# Patient Record
Sex: Male | Born: 1955
Health system: Southern US, Community
[De-identification: ages and names within clinical notes are randomized; demographics above are authoritative.]

## PROBLEM LIST (undated history)

## (undated) DIAGNOSIS — J449 Chronic obstructive pulmonary disease, unspecified: Secondary | ICD-10-CM

## (undated) DIAGNOSIS — I10 Essential (primary) hypertension: Secondary | ICD-10-CM

## (undated) DIAGNOSIS — G8929 Other chronic pain: Secondary | ICD-10-CM

## (undated) DIAGNOSIS — K219 Gastro-esophageal reflux disease without esophagitis: Secondary | ICD-10-CM

## (undated) DIAGNOSIS — M542 Cervicalgia: Secondary | ICD-10-CM

## (undated) DIAGNOSIS — K5900 Constipation, unspecified: Secondary | ICD-10-CM

## (undated) DIAGNOSIS — I739 Peripheral vascular disease, unspecified: Secondary | ICD-10-CM

## (undated) HISTORY — DX: Peripheral vascular disease, unspecified: I73.9

## (undated) HISTORY — PX: ILIAC ARTERY STENT: SHX1786

## (undated) HISTORY — DX: Chronic obstructive pulmonary disease, unspecified: J44.9

## (undated) HISTORY — PX: ANKLE SURGERY: SHX546

---

## 2007-12-15 LAB — METABOLIC PANEL, BASIC
Anion gap: 6 mmol/L (ref 5–15)
BUN/Creatinine ratio: 18 (ref 12–20)
BUN: 21 MG/DL — ABNORMAL HIGH (ref 6–20)
CO2: 31 MMOL/L (ref 21–32)
Calcium: 9.1 MG/DL (ref 8.5–10.1)
Chloride: 100 MMOL/L (ref 97–108)
Creatinine: 1.2 MG/DL (ref 0.6–1.3)
GFR est AA: >60 mL/min/{1.73_m2} (ref >60)
GFR est non-AA: >60 mL/min/{1.73_m2} (ref >60)
Glucose: 130 MG/DL — ABNORMAL HIGH (ref 50–100)
Potassium: 4.3 MMOL/L (ref 3.5–5.1)
Sodium: 137 MMOL/L (ref 136–145)

## 2007-12-15 LAB — HEPATIC FUNCTION PANEL
A-G Ratio: 1.2 (ref 1.1–2.2)
ALT (SGPT): 30 U/L (ref 30–65)
AST (SGOT): 10 U/L — ABNORMAL LOW (ref 15–37)
Albumin: 3.9 g/dL (ref 3.5–5.0)
Alk. phosphatase: 71 U/L (ref 50–136)
Bilirubin, direct: 0.1 MG/DL (ref 0.0–0.3)
Bilirubin, total: 0.4 MG/DL (ref <1.0)
Globulin: 3.3 g/dL (ref 2.0–4.0)
Protein, total: 7.2 g/dL (ref 6.4–8.2)

## 2007-12-15 LAB — LIPID PANEL
CHOL/HDL Ratio: 5.4 — ABNORMAL HIGH (ref 0–5.0)
Cholesterol, total: 220 MG/DL — ABNORMAL HIGH (ref <200)
HDL Cholesterol: 41 MG/DL (ref 40–60)
LDL, calculated: 123 MG/DL — ABNORMAL HIGH (ref 0–100)
Triglyceride: 280 MG/DL — ABNORMAL HIGH (ref 30–200)
VLDL, calculated: 56 MG/DL

## 2007-12-15 LAB — CBC W/O DIFF
HCT: 47.1 % (ref 36.6–50.3)
HGB: 15.4 g/dL (ref 12.1–17.0)
MCH: 31.5 PG (ref 26.0–34.0)
MCHC: 32.7 g/dL (ref 30.0–35.0)
MCV: 96.3 FL (ref 80.0–99.0)
PLATELET: 327 10*3/uL (ref 150–400)
RBC: 4.89 M/uL (ref 4.10–5.70)
RDW: 14 % (ref 11.5–14.5)
WBC: 8.3 10*3/uL (ref 4.1–11.1)

## 2007-12-15 LAB — PSA SCREENING (SCREENING): Prostate Specific Ag: 0.2 NG/ML (ref <4.0)

## 2007-12-16 LAB — HEMOGLOBIN A1C WITH EAG: Hemoglobin A1c: 6.6 % — ABNORMAL HIGH (ref 4.2–5.8)

## 2008-08-13 ENCOUNTER — Emergency Department (HOSPITAL_COMMUNITY): Admission: EM | Admit: 2008-08-13 | Discharge: 2008-08-14 | Payer: Self-pay | Admitting: Emergency Medicine

## 2010-11-19 LAB — CBC
HCT: 44.5 % (ref 39.0–52.0)
Platelets: 353 10*3/uL (ref 150–400)
RDW: 13.6 % (ref 11.5–15.5)

## 2010-11-19 LAB — POCT CARDIAC MARKERS
Myoglobin, poc: 61.2 ng/mL (ref 12–200)
Troponin i, poc: 0.09 ng/mL (ref 0.00–0.09)

## 2010-11-19 LAB — BASIC METABOLIC PANEL
BUN: 12 mg/dL (ref 6–23)
Calcium: 9.6 mg/dL (ref 8.4–10.5)
Creatinine, Ser: 1.04 mg/dL (ref 0.4–1.5)
GFR calc non Af Amer: >60 mL/min (ref >60)
Glucose, Bld: 138 mg/dL — ABNORMAL HIGH (ref 70–99)
Potassium: 3.6 mEq/L (ref 3.5–5.1)

## 2010-11-19 LAB — TROPONIN I: Troponin I: <0.01 ng/mL (ref 0.00–0.06)

## 2010-11-19 LAB — DIFFERENTIAL
Basophils Absolute: 0 10*3/uL (ref 0.0–0.1)
Eosinophils Relative: 1 % (ref 0–5)
Lymphocytes Relative: 18 % (ref 12–46)

## 2010-11-19 LAB — D-DIMER, QUANTITATIVE: D-Dimer, Quant: 0.49 ug/mL-FEU — ABNORMAL HIGH (ref 0.00–0.48)

## 2011-07-27 ENCOUNTER — Emergency Department (HOSPITAL_COMMUNITY)
Admission: EM | Admit: 2011-07-27 | Discharge: 2011-07-27 | Disposition: A | Discharge status: Home / Self Care | Payer: Self-pay | Attending: Emergency Medicine | Admitting: Emergency Medicine

## 2011-07-27 ENCOUNTER — Encounter: Payer: Self-pay | Admitting: *Deleted

## 2011-07-27 DIAGNOSIS — H6690 Otitis media, unspecified, unspecified ear: Secondary | ICD-10-CM | POA: Insufficient documentation

## 2011-07-27 DIAGNOSIS — F17200 Nicotine dependence, unspecified, uncomplicated: Secondary | ICD-10-CM | POA: Insufficient documentation

## 2011-07-27 DIAGNOSIS — R51 Headache: Secondary | ICD-10-CM | POA: Insufficient documentation

## 2011-07-27 DIAGNOSIS — H6691 Otitis media, unspecified, right ear: Secondary | ICD-10-CM

## 2011-07-27 DIAGNOSIS — R05 Cough: Secondary | ICD-10-CM | POA: Insufficient documentation

## 2011-07-27 DIAGNOSIS — H9209 Otalgia, unspecified ear: Secondary | ICD-10-CM | POA: Insufficient documentation

## 2011-07-27 DIAGNOSIS — R059 Cough, unspecified: Secondary | ICD-10-CM | POA: Insufficient documentation

## 2011-07-27 HISTORY — DX: Gastro-esophageal reflux disease without esophagitis: K21.9

## 2011-07-27 HISTORY — DX: Essential (primary) hypertension: I10

## 2011-07-27 MED ORDER — AMOXICILLIN-POT CLAVULANATE 500-125 MG PO TABS: 1.0000 | ORAL_TABLET | Freq: Three times a day (TID) | ORAL | Status: AC

## 2011-07-27 MED ORDER — HYDROCODONE-ACETAMINOPHEN 5-325 MG PO TABS: 1.0000 | ORAL_TABLET | Freq: Four times a day (QID) | ORAL | Status: AC | PRN

## 2011-07-27 NOTE — ED Provider Notes (Signed)
History     CSN: [id number]  Arrival date & time 07/27/11  1256   First MD Initiated Contact with Patient 07/27/11 1620      Chief Complaint  Patient presents with  . Otalgia    right   . Nasal Congestion    (Consider location/radiation/quality/duration/timing/severity/associated sxs/prior treatment) Patient is a 55 y.o. male presenting with ear pain. The history is provided by the patient.  Otalgia This is a new problem. The current episode started more than 1 week ago. There is pain in the right ear. The problem occurs constantly. The problem has not changed since onset.There has been no fever. The pain is at a severity of 7/10. The pain is moderate. Associated symptoms include headaches, hearing loss, rhinorrhea, sore throat and cough. Pertinent negatives include no ear discharge, no abdominal pain, no neck pain and no rash.  Pt states right ear pain and decreased hearing for 2 wks. States was seen in ED in [place] 5 days ago. Was started on Z-pack, which he finished today, and on auralgan drops, which help for "few minutes." States pain not improving. Denies fever, chills, pain behind ear, swollen lymph nodes, neck pain or stiffness. Does have mild headache.  Past Medical History  Diagnosis Date  . Hypertension   . Diabetes mellitus   . Acid reflux   . Asthma     Past Surgical History  Procedure Date  . Ankle surgery   . Iliac artery stent     No family history on file.  History  Substance Use Topics  . Smoking status: Current Everyday Smoker -- 1.0 packs/day  . Smokeless tobacco: Never Used  . Alcohol Use: No      Review of Systems  Constitutional: Negative for fever and chills.  HENT: Positive for hearing loss, ear pain, sore throat and rhinorrhea. Negative for neck pain and ear discharge.   Eyes: Negative for discharge and redness.  Respiratory: Positive for cough. Negative for chest tightness and shortness of breath.   Cardiovascular: Negative.     Gastrointestinal: Negative.  Negative for abdominal pain.  Genitourinary: Negative.   Skin: Negative.  Negative for rash.  Neurological: Positive for headaches.  Psychiatric/Behavioral: Negative.     Allergies  Latex  Home Medications  No current outpatient prescriptions on file.  BP 127/86  Pulse 94  Temp(Src) 98.6 F (37 C) (Oral)  Resp 20  SpO2 97%  Physical Exam  Nursing note and vitals reviewed. Constitutional: He is oriented to person, place, and time. He appears well-developed and well-nourished. No distress.  HENT:  Head: Normocephalic and atraumatic.  Right Ear: No drainage, swelling or tenderness. No mastoid tenderness. Tympanic membrane is erythematous and bulging. Decreased hearing is noted.  Left Ear: Hearing, tympanic membrane, external ear and ear canal normal.  Nose: Nose normal.  Mouth/Throat: Uvula is midline, oropharynx is clear and moist and mucous membranes are normal.  Neck: Normal range of motion. Neck supple.       No meningismus  Cardiovascular: Normal rate, regular rhythm and normal heart sounds.   Pulmonary/Chest: Effort normal and breath sounds normal. No respiratory distress.  Musculoskeletal: Normal range of motion. He exhibits no edema.  Lymphadenopathy:    He has no cervical adenopathy.  Neurological: He is alert and oriented to person, place, and time.  Skin: Skin is warm and dry. No rash noted. No erythema.  Psychiatric: He has a normal mood and affect.    ED Course  Procedures (including critical care time)  Pt wit right ear pain now for 2 wks. Exam consistent with otitis media of right ear. Pt finished z-pack with no improvement. Still taking Claritin and auralgan. Will switch to augmentin and follow up with ENT. Pt otherwise non toxic. VS normal. Pt in NAD. No mastoid tenderness, no meningismus.    MDM          Lottie Mussel, PA 07/27/11 [phone]

## 2011-07-27 NOTE — ED Notes (Signed)
Pt states he has had right ear pain with head/nasal congestion x2 weeks.  Pt also states he has had a cough productive of yellow and green sputum for 1.5 weeks.  Pt denies V/D, but endorses nausea.  Pt was seen in an ED in [place] 2 days ago.  Pt was dx with "fluid behind the ear."  Pt was given rx for abx, claritin and ear drops.  Pt has finished his abx and states he has gained no relief from these medications.  On exam, pt's ear canal is red.  Pt also c/o headache.

## 2011-07-28 NOTE — ED Provider Notes (Signed)
Medical screening examination/treatment/procedure(s) were performed by non-physician practitioner and as supervising physician I was immediately available for consultation/collaboration.   Emory Leaver M Keny Donald, MD 07/28/11 0008 

## 2011-08-09 ENCOUNTER — Emergency Department (HOSPITAL_COMMUNITY)
Admission: EM | Admit: 2011-08-09 | Discharge: 2011-08-09 | Disposition: A | Discharge status: Home / Self Care | Payer: Worker's Compensation | Attending: Emergency Medicine | Admitting: Emergency Medicine

## 2011-08-09 ENCOUNTER — Emergency Department (HOSPITAL_COMMUNITY): Payer: Worker's Compensation

## 2011-08-09 ENCOUNTER — Encounter (HOSPITAL_COMMUNITY): Payer: Self-pay

## 2011-08-09 ENCOUNTER — Other Ambulatory Visit: Payer: Self-pay

## 2011-08-09 DIAGNOSIS — R000 Tachycardia, unspecified: Secondary | ICD-10-CM | POA: Insufficient documentation

## 2011-08-09 DIAGNOSIS — J45909 Unspecified asthma, uncomplicated: Secondary | ICD-10-CM | POA: Insufficient documentation

## 2011-08-09 DIAGNOSIS — I10 Essential (primary) hypertension: Secondary | ICD-10-CM | POA: Insufficient documentation

## 2011-08-09 DIAGNOSIS — R0602 Shortness of breath: Secondary | ICD-10-CM | POA: Insufficient documentation

## 2011-08-09 DIAGNOSIS — Z79899 Other long term (current) drug therapy: Secondary | ICD-10-CM | POA: Insufficient documentation

## 2011-08-09 DIAGNOSIS — Z7982 Long term (current) use of aspirin: Secondary | ICD-10-CM | POA: Insufficient documentation

## 2011-08-09 DIAGNOSIS — R059 Cough, unspecified: Secondary | ICD-10-CM | POA: Insufficient documentation

## 2011-08-09 DIAGNOSIS — R05 Cough: Secondary | ICD-10-CM | POA: Insufficient documentation

## 2011-08-09 DIAGNOSIS — F17200 Nicotine dependence, unspecified, uncomplicated: Secondary | ICD-10-CM | POA: Insufficient documentation

## 2011-08-09 DIAGNOSIS — K219 Gastro-esophageal reflux disease without esophagitis: Secondary | ICD-10-CM | POA: Insufficient documentation

## 2011-08-09 DIAGNOSIS — Z0000 Encounter for general adult medical examination without abnormal findings: Secondary | ICD-10-CM

## 2011-08-09 DIAGNOSIS — R079 Chest pain, unspecified: Secondary | ICD-10-CM | POA: Insufficient documentation

## 2011-08-09 DIAGNOSIS — E119 Type 2 diabetes mellitus without complications: Secondary | ICD-10-CM | POA: Insufficient documentation

## 2011-08-09 NOTE — ED Provider Notes (Signed)
History     CSN: [id number]  Arrival date & time 08/09/11  1414   First MD Initiated Contact with Patient 08/09/11 1711      Chief Complaint  Patient presents with  . Chest Pain    (Consider location/radiation/quality/duration/timing/severity/associated sxs/prior treatment) Patient is a 56 y.o. male presenting with chest pain. The history is provided by the patient.  Chest Pain The chest pain began more  than 1 month ago. Chest pain occurs intermittently. The chest pain is unchanged. The pain is associated with stress. At its most intense, the pain is at 6/10. The pain is currently at 0/10. The severity of the pain is moderate. The quality of the pain is described as sharp. The pain does not radiate. Chest pain is worsened by stress. Primary symptoms include cough and wheezing. Pertinent negatives for primary symptoms include no fever, no shortness of breath, no palpitations and no dizziness.  Pertinent negatives for associated symptoms include no diaphoresis and no weakness.   Pt states he has been getting episodes of CP that come and go, unrelated to activity or exertion for the last 3 months. Pt is going through physical therapy for his foot problem, and has mentioned this pain to his PT therapist. Pt was told in order to continue this therapy, he has be to cleared by PCP. Pt states he does not have a pcp and was told he may be able to be cleared by ER. Pt denies recent physical exam, recent cardiac testing. Denies current chest pain, SOB, nause, vomiting, diaphoresis. Pt denies SOB, nausea, diaphoresis, dizziness during episodes of chest pain. Pt states last episode was earlier today. No completely pain free.  Past Medical History  Diagnosis Date  . Hypertension   . Diabetes mellitus   . Acid reflux   . Asthma     Past Surgical History  Procedure Date  . Ankle surgery   . Iliac artery stent     No family history on file.  History  Substance Use Topics  . Smoking status:  Current Everyday Smoker -- 1.0 packs/day  . Smokeless tobacco: Never Used  . Alcohol Use: No      Review of Systems  Constitutional: Negative for fever, chills and diaphoresis.  HENT: Negative.   Respiratory: Positive for cough and wheezing. Negative for shortness of breath.   Cardiovascular: Positive for chest pain. Negative for palpitations and leg swelling.  Genitourinary: Negative.   Musculoskeletal: Negative.   Neurological: Negative for dizziness and weakness.  Psychiatric/Behavioral: Negative.     Allergies  Latex  Home Medications   Current Outpatient Rx  Name Route Sig Dispense Refill  . ALBUTEROL SULFATE HFA 108 (90 BASE) MCG/ACT IN AERS Inhalation Inhale 2 puffs into the lungs every 6 (six) hours as needed.      . AMOXICILLIN-POT CLAVULANATE 875-125 MG PO TABS Oral Take 1 tablet by mouth 3 (three) times daily. Completed on 08/03/11 7 day course     . ASPIRIN EC 81 MG PO TBEC Oral Take 81 mg by mouth daily.      Marland Kitchen GLIMEPIRIDE 4 MG PO TABS Oral Take 4 mg by mouth 2 (two) times daily.      Marland Kitchen HYDROCODONE-ACETAMINOPHEN 5-325 MG PO TABS Oral Take 2 tablets by mouth every 6 (six) hours as needed. For pain     . LOSARTAN POTASSIUM-HCTZ 100-25 MG PO TABS Oral Take 1 tablet by mouth daily.      Marland Kitchen METHOCARBAMOL 500 MG PO TABS Oral Take 500  mg by mouth every 8 (eight) weeks.      . OMEPRAZOLE 20 MG PO CPDR Oral Take 20 mg by mouth daily.      Marland Kitchen SAXAGLIPTIN-METFORMIN ER 12-998 MG PO TB24 Oral Take 1 tablet by mouth daily.        BP 132/115  Pulse 65  Temp(Src) 98.1 F (36.7 C) (Oral)  Resp 18  Ht 6' (1.829 m)  Wt 216 lb (97.977 kg)  BMI 29.29 kg/m2  SpO2 96%  Physical Exam  Nursing note and vitals reviewed. Constitutional: He is oriented to person, place, and time. He appears well-developed and well-nourished. No distress.  HENT:  Head: Normocephalic and atraumatic.  Neck: Neck supple.  Cardiovascular: Regular rhythm and normal heart sounds.   No murmur heard.       tachycardic  Pulmonary/Chest: Effort normal. No respiratory distress. He has wheezes.       Expiratory wheezes in all lung fields  Abdominal: Soft. Bowel sounds are normal. There is no tenderness.  Musculoskeletal: He exhibits no edema.  Neurological: He is alert and oriented to person, place, and time.  Skin: Skin is warm and dry.  Psychiatric: He has a normal mood and affect.    ED Course  Procedures (including critical care time)  Pt with no current symptoms. Here for "clearance for physical therapy." Explained to pt we are unable to clear him, and he will need physical exam, and more testing then we do here. ECG and CXR done now. Pt has no symptoms, VS  Normal. CP he describes atypical.   Date: 08/09/2011  Rate: 117  Rhythm: sinus tachycardia and premature atrial contractions (PAC)  QRS Axis: normal  Intervals: normal  ST/T Wave abnormalities: normal  Conduction Disutrbances:none  Narrative Interpretation:   Old EKG Reviewed: none available   Dg Chest 2 View  08/09/2011  *RADIOLOGY REPORT*  Clinical Data: 56 year old male with chest pain and shortness of breath.  CHEST - 2 VIEW  Comparison: 11/14/2009 chest radiograph  Findings: The cardiomediastinal silhouette is unremarkable. The lungs are clear. There is no evidence of focal airspace disease, pulmonary edema, pulmonary nodule/mass, pleural effusion, or pneumothorax. No acute bony abnormalities are identified.  IMPRESSION: No evidence of active cardiopulmonary disease.  Original Report Authenticated By: Rosendo Gros, M.D.   Pt is currently CP or any symptom free, here only to  Get cleared for PT. Pt states he has no other doctors to go to. Resource guide provided. Will d/c home with follow up.    MDM          Lottie Mussel, PA 08/09/11 2151

## 2011-08-09 NOTE — ED Notes (Signed)
Pt presents with 1.5 month h/o L sided chest pain.  Pt reports pain is intermittent, but always in the same area.  -shortness of breath, pt reports productive cough x 1 month with yellow phlegm.

## 2011-08-09 NOTE — ED Provider Notes (Signed)
Pt seen with Pa EKG reviewed No sob/dizziness/diaphoresis/nausea/vomiting reported Doubt ACS/PE/Dissection at this time BP 130/82  Pulse 108  Temp(Src) 97.9 F (36.6 C) (Oral)  Resp 22  Ht 6' (1.829 m)  Wt 216 lb (97.977 kg)  BMI 29.29 kg/m2  SpO2 98%   Joya Gaskins, MD 08/09/11 [phone]

## 2011-08-09 NOTE — ED Notes (Signed)
Discharge instructions reviewed;  Verbalizes understanding.   No questions asked; no further c/o's voiced.  Pt ambulatory to lobby.  NAD noted.

## 2011-08-10 NOTE — ED Provider Notes (Signed)
Medical screening examination/treatment/procedure(s) were conducted as a shared visit with non-physician practitioner(s) and myself.  I personally evaluated the patient during the encounter  EKG reviewed I doubt ACS/PE/Dissection at this time  Joya Gaskins, MD 08/10/11 [phone]

## 2011-10-05 ENCOUNTER — Other Ambulatory Visit: Payer: Self-pay

## 2011-10-05 ENCOUNTER — Emergency Department (HOSPITAL_COMMUNITY)
Admission: EM | Admit: 2011-10-05 | Discharge: 2011-10-05 | Disposition: A | Discharge status: Home Health | Payer: Self-pay | Attending: Emergency Medicine | Admitting: Emergency Medicine

## 2011-10-05 ENCOUNTER — Encounter (HOSPITAL_COMMUNITY): Payer: Self-pay | Admitting: Emergency Medicine

## 2011-10-05 ENCOUNTER — Emergency Department (HOSPITAL_COMMUNITY): Payer: Self-pay

## 2011-10-05 DIAGNOSIS — I10 Essential (primary) hypertension: Secondary | ICD-10-CM | POA: Insufficient documentation

## 2011-10-05 DIAGNOSIS — J45909 Unspecified asthma, uncomplicated: Secondary | ICD-10-CM | POA: Insufficient documentation

## 2011-10-05 DIAGNOSIS — R0602 Shortness of breath: Secondary | ICD-10-CM | POA: Insufficient documentation

## 2011-10-05 DIAGNOSIS — R05 Cough: Secondary | ICD-10-CM | POA: Insufficient documentation

## 2011-10-05 DIAGNOSIS — Z720 Tobacco use: Secondary | ICD-10-CM

## 2011-10-05 DIAGNOSIS — E119 Type 2 diabetes mellitus without complications: Secondary | ICD-10-CM | POA: Insufficient documentation

## 2011-10-05 DIAGNOSIS — M79609 Pain in unspecified limb: Secondary | ICD-10-CM | POA: Insufficient documentation

## 2011-10-05 DIAGNOSIS — Z7982 Long term (current) use of aspirin: Secondary | ICD-10-CM | POA: Insufficient documentation

## 2011-10-05 DIAGNOSIS — F17200 Nicotine dependence, unspecified, uncomplicated: Secondary | ICD-10-CM | POA: Insufficient documentation

## 2011-10-05 DIAGNOSIS — Z79899 Other long term (current) drug therapy: Secondary | ICD-10-CM | POA: Insufficient documentation

## 2011-10-05 DIAGNOSIS — R059 Cough, unspecified: Secondary | ICD-10-CM | POA: Insufficient documentation

## 2011-10-05 DIAGNOSIS — R079 Chest pain, unspecified: Secondary | ICD-10-CM | POA: Insufficient documentation

## 2011-10-05 DIAGNOSIS — J209 Acute bronchitis, unspecified: Secondary | ICD-10-CM | POA: Insufficient documentation

## 2011-10-05 DIAGNOSIS — K219 Gastro-esophageal reflux disease without esophagitis: Secondary | ICD-10-CM | POA: Insufficient documentation

## 2011-10-05 MED ORDER — IPRATROPIUM BROMIDE 0.02 % IN SOLN
0.5000 mg | Freq: Once | RESPIRATORY_TRACT | Status: AC
  Administered 2011-10-05: 0.5 mg via RESPIRATORY_TRACT
  Filled 2011-10-05: qty 2.5

## 2011-10-05 MED ORDER — PREDNISONE 20 MG PO TABS
60.0000 mg | ORAL_TABLET | Freq: Once | ORAL | Status: AC
  Administered 2011-10-05: 60 mg via ORAL
  Filled 2011-10-05: qty 1

## 2011-10-05 MED ORDER — AEROCHAMBER Z-STAT PLUS/MEDIUM MISC
1.0000 | Freq: Once | Status: AC
  Administered 2011-10-05: 1
  Filled 2011-10-05: qty 1

## 2011-10-05 MED ORDER — ALBUTEROL SULFATE (5 MG/ML) 0.5% IN NEBU
5.0000 mg | INHALATION_SOLUTION | Freq: Once | RESPIRATORY_TRACT | Status: AC
  Administered 2011-10-05: 5 mg via RESPIRATORY_TRACT
  Filled 2011-10-05: qty 1

## 2011-10-05 MED ORDER — ALBUTEROL SULFATE HFA 108 (90 BASE) MCG/ACT IN AERS
2.0000 | INHALATION_SPRAY | RESPIRATORY_TRACT | Status: DC | PRN
  Administered 2011-10-05: 2 via RESPIRATORY_TRACT
  Filled 2011-10-05: qty 6.7

## 2011-10-05 MED ORDER — PREDNISONE 20 MG PO TABS: 20.0000 mg | ORAL_TABLET | Freq: Two times a day (BID) | ORAL | Status: AC

## 2011-10-05 NOTE — ED Notes (Signed)
Pt c/o (L) side chest pain  Radiating down (L) arm, nausea, increase sob w/exertion, diaphoretic, productive cough w/yellow/green colored sputum x1 day. Pt reports hx of bronchitis, asthma and (R) shoulder blade pain, and smokes 1 pack cigarettes/day.

## 2011-10-05 NOTE — Discharge Instructions (Signed)
Stop smoking!!! Use the inhaler, 2 puffs every 4 hours as needed for trouble breathing or cough. Start the prednisone prescription tomorrow.Bronchitis Bronchitis is a problem of the air tubes leading to your lungs. This problem makes it hard for air to get in and out of the lungs. You may cough a lot because your air tubes are narrow. Going without care can cause lasting (chronic) bronchitis. HOME CARE   Drink enough fluids to keep your pee (urine) clear or pale yellow.   Use a cool mist humidifier.   Quit smoking if you smoke. If you keep smoking, the bronchitis might not get better.   Only take medicine as told by your doctor.  GET HELP RIGHT AWAY IF:   Coughing keeps you awake.   You start to wheeze.   You become more sick or weak.   You have a hard time breathing or get short of breath.   You cough up blood.   Coughing lasts more than 2 weeks.   You have a fever.   Your baby is older than 3 months with a rectal temperature of 102 F (38.9 C) or higher.   Your baby is 36 months old or younger with a rectal temperature of 100.4 F (38 C) or higher.  MAKE SURE YOU:  Understand these instructions.   Will watch your condition.   Will get help right away if you are not doing well or get worse.  Document Released: 01/08/2008 Document Revised: 04/03/2011 Document Reviewed: 06/23/2009 [facility] Patient Information 2012 [place], [place].Chronic Obstructive Pulmonary Disease Chronic obstructive pulmonary disease (COPD) is a condition in which airflow from the lungs is restricted. The lungs can never return to normal, but there are measures you can take which will improve them and make you feel better. CAUSES   Smoking.   Exposure to secondhand smoke.   Breathing in irritants (pollution, cigarette smoke, strong smells, aerosol sprays, paint fumes).   History of lung infections.  TREATMENT  Treatment focuses on making you comfortable (supportive care). Your caregiver may  prescribe medications (inhaled or pills) to help improve your breathing. HOME CARE INSTRUCTIONS   If you smoke, stop smoking.   Avoid exposure to smoke, chemicals, and fumes that aggravate your breathing.   Take antibiotic medicines as directed by your caregiver.   Avoid medicines that dry up your system and slow down the elimination of secretions (antihistamines and cough syrups). This decreases respiratory capacity and may lead to infections.   Drink enough water and fluids to keep your urine clear or pale yellow. This loosens secretions.   Use humidifiers at home and at your bedside if they do not make breathing difficult.   Receive all protective vaccines your caregiver suggests, especially pneumococcal and influenza.   Use home oxygen as suggested.   Stay active. Exercise and physical activity will help maintain your ability to do things you want to do.   Eat a healthy diet.  SEEK MEDICAL CARE IF:   You develop pus-like mucus (sputum).   Breathing is more labored or exercise becomes difficult to do.   You are running out of the medicine you take for your breathing.  SEEK IMMEDIATE MEDICAL CARE IF:   You have a rapid heart rate.   You have agitation, confusion, tremors, or are in a stupor (family members may need to observe this).   It becomes difficult to breathe.   You develop chest pain.   You have a fever.  MAKE SURE YOU:  Understand these instructions.   Will watch your condition.   Will get help right away if you are not doing well or get worse.  Document Released: 05/01/2005 Document Revised: 04/03/2011 Document Reviewed: 09/21/2010 [facility] Patient Information 2012 [place], [place].Nicotine Addiction Nicotine can act as both a stimulant (excites/activates) and a sedative (calms/quiets). Immediately after exposure to nicotine, there is a "kick" caused in part by the drug's stimulation of the adrenal glands and resulting discharge of adrenaline  (epinephrine). The rush of adrenaline stimulates the body and causes a sudden release of sugar. This means that smokers are always slightly hyperglycemic. Hyperglycemic means that the blood sugar is high, just like in diabetics. Nicotine also decreases the amount of insulin which helps control sugar levels in the body. There is an increase in blood pressure, breathing, and the rate of heart beats.  In addition, nicotine indirectly causes a release of dopamine in the brain that controls pleasure and motivation. A similar reaction is seen with other drugs of abuse, such as cocaine and heroin. This dopamine release is thought to cause the pleasurable sensations when smoking. In some different cases, nicotine can also create a calming effect, depending on sensitivity of the smoker's nervous system and the dose of nicotine taken. WHAT HAPPENS WHEN NICOTINE IS TAKEN FOR LONG PERIODS OF TIME?  Long-term use of nicotine results in addiction. It is difficult to stop.   Repeated use of nicotine creates tolerance. Higher doses of nicotine are needed to get the "kick."  When nicotine use is stopped, withdrawal may last a month or more. Withdrawal may begin within a few hours after the last cigarette. Symptoms peak within the first few days and may lessen within a few weeks. For some people, however, symptoms may last for months or longer. Withdrawal symptoms include:   Irritability.   Craving.   Learning and attention deficits.   Sleep disturbances.   Increased appetite.  Craving for tobacco may last for 6 months or longer. Many behaviors done while using nicotine can also play a part in the severity of withdrawal symptoms. For some people, the feel, smell, and sight of a cigarette and the ritual of obtaining, handling, lighting, and smoking the cigarette are closely linked with the pleasure of smoking. When stopped, they also miss the related behaviors which make the withdrawal or craving worse. While  nicotine gum and patches may lessen the drug aspects of withdrawal, cravings often persist. WHAT ARE THE MEDICAL CONSEQUENCES OF NICOTINE USE?  Nicotine addiction accounts for one-third of all cancers. The top cancer caused by tobacco is lung cancer. Lung cancer is the number one cancer killer of both men and women.   Smoking is also associated with cancers of the:   Mouth.   Pharynx.   Larynx.   Esophagus.   Stomach.   Pancreas.   Cervix.   Kidney.   Ureter.   Bladder.   Smoking also causes lung diseases such as lasting (chronic) bronchitis and emphysema.   It worsens asthma in adults and children.   Smoking increases the risk of heart disease, including:   Stroke.   Heart attack.   Vascular disease.   Aneurysm.   Passive or secondary smoke can also increase medical risks including:   Asthma in children.   Sudden Infant Death Syndrome (SIDS).   Additionally, dropped cigarettes are the leading cause of residential fire fatalities.   Nicotine poisoning has been reported from accidental ingestion of tobacco products by children and pets. Death usually results  in a few minutes from respiratory failure (when a person stops breathing) caused by paralysis.  TREATMENT   Medication. Nicotine replacement medicines such as nicotine gum and the patch are used to stop smoking. These medicines gradually lower the dosage of nicotine in the body. These medicines do not contain the carbon monoxide and other toxins found in tobacco smoke.   Hypnotherapy.   Relaxation therapy.   Nicotine Anonymous (a 12-step support program). Find times and locations in your local yellow pages.  Document Released: 03/27/2004 Document Revised: 04/03/2011 Document Reviewed: 08/19/2007 [facility] Patient Information 2012 [place], [place].Shortness of Breath Shortness of breath (dyspnea) is the feeling of uneasy breathing. Shortness of breath does not always mean that there is a life threatening  illness. Dyspnea needs care right away. CAUSES  The list of causes for shortness of breath is very long and includes:  Not enough oxygen in the air (as with high altitudes or with a smoke-filled room).   Acute lung disease.   Infections such as pneumonia.   Fluid in the lungs, such as heart failure.   Blood clot in the lungs (pulmonary embolism).   Lasting (chronic) lung diseases.   Heart Disease (heart attack, angina, heart failure, and others).   Low red blood cells (anemia).   Poor physical fitness can cause shortness of breath with exercise.   Chest or back injuries or stiffness.   Being overweight (obese) can make it hard to breath.   Anxiety can make you feel like you are not getting enough air even though everything is all right.  DIAGNOSIS  Many tests may be done to find why you are having shortness of breath. Tests include:  Chest X-rays.   Lung function tests.   Blood tests.   Electrocardiogram.   Exercise testing.   A cardiac echo.   Scans.  Serious medical problems will usually be found during your exam. Your caregiver may not be able to find a cause for your shortness of breath after looking at you. In this case, it is important to have a follow-up and exam with your caregiver after a period of time.  HOME CARE INSTRUCTIONS   Do not smoke. Smoking is a common cause of shortness of breath. Ask for help to stop smoking.   Avoid being around chemicals that may bother your breathing (paint fumes, dust, etc.).   Rest as needed. Slowly begin your usual activities.   If medications were prescribed, take them as directed for the full length of time directed. This includes oxygen and any inhaled medications, if prescribed.   It is very important that you follow up with your caregiver or other physician as directed. Waiting to do so or failure to follow-up could result in worsening of your condition and possible disability or death.   Be sure you understand  what to do or who to call if your shortness of breath worsens.  SEEK MEDICAL CARE IF:   Your condition does not improve in the time expected.   You have a hard time doing your normal activities even with rest.   You have any side effects from or problems with medications prescribed.   You develop any new symptoms not discussed below.  SEEK IMMEDIATE MEDICAL CARE IF:   You feel your shortness of breath is getting worse.   You feel lightheaded, faint or develop a cough not controlled with medications.   You start coughing up blood.   You get pain with breathing.   You get  chest pain or pain in your arms, shoulders or belly (abdomen).   You have a fever.   You are unable to walk up stairs or exercise the way you normally can.   If any of the symptoms, which brought you into the emergency room before, are getting worse and not better.  Document Released: 04/16/2001 Document Revised: 04/03/2011 Document Reviewed: 12/02/2007 [facility] Patient Information 2012 [place], [place].Smoking Cessation This document explains the best ways for you to quit smoking and new treatments to help. It lists new medicines that can double or triple your chances of quitting and quitting for good. It also considers ways to avoid relapses and concerns you may have about quitting, including weight gain. NICOTINE: A POWERFUL ADDICTION If you have tried to quit smoking, you know how hard it can be. It is hard because nicotine is a very addictive drug. For some people, it can be as addictive as heroin or cocaine. Usually, people make 2 or 3 tries, or more, before finally being able to quit. Each time you try to quit, you can learn about what helps and what hurts. Quitting takes hard work and a lot of effort, but you can quit smoking. QUITTING SMOKING IS ONE OF THE MOST IMPORTANT THINGS YOU WILL EVER DO.  You will live longer, feel better, and live better.   The impact on your body of quitting smoking is felt almost  immediately:   Within 20 minutes, blood pressure decreases. Pulse returns to its normal level.   After 8 hours, carbon monoxide levels in the blood return to normal. Oxygen level increases.   After 24 hours, chance of heart attack starts to decrease. Breath, hair, and body stop smelling like smoke.   After 48 hours, damaged nerve endings begin to recover. Sense of taste and smell improve.   After 72 hours, the body is virtually free of nicotine. Bronchial tubes relax and breathing becomes easier.   After 2 to 12 weeks, lungs can hold more air. Exercise becomes easier and circulation improves.   Quitting will reduce your risk of having a heart attack, stroke, cancer, or lung disease:   After 1 year, the risk of coronary heart disease is cut in half.   After 5 years, the risk of stroke falls to the same as a nonsmoker.   After 10 years, the risk of lung cancer is cut in half and the risk of other cancers decreases significantly.   After 15 years, the risk of coronary heart disease drops, usually to the level of a nonsmoker.   If you are pregnant, quitting smoking will improve your chances of having a healthy baby.   The people you live with, especially your children, will be healthier.   You will have extra money to spend on things other than cigarettes.  FIVE KEYS TO QUITTING Studies have shown that these 5 steps will help you quit smoking and quit for good. You have the best chances of quitting if you use them together: 1. Get ready.  2. Get support and encouragement.  3. Learn new skills and behaviors.  4. Get medicine to reduce your nicotine addiction and use it correctly.  5. Be prepared for relapse or difficult situations. Be determined to continue trying to quit, even if you do not succeed at first.  1. GET READY  Set a quit date.   Change your environment.   Get rid of ALL cigarettes, ashtrays, matches, and lighters in your home, car, and place of work.  Do not let  people smoke in your home.   Review your past attempts to quit. Think about what worked and what did not.   Once you quit, do not smoke. NOT EVEN A PUFF!  2. GET SUPPORT AND ENCOURAGEMENT Studies have shown that you have a better chance of being successful if you have help. You can get support in many ways.  Tell your family, friends, and coworkers that you are going to quit and need their support. Ask them not to smoke around you.   Talk to your caregivers (doctor, dentist, nurse, pharmacist, psychologist, and/or smoking counselor).   Get individual, group, or telephone counseling and support. The more counseling you have, the better your chances are of quitting. Programs are available at [employer] and health centers. Call your local health department for information about programs in your area.   Spiritual beliefs and practices may help some smokers quit.   Quit meters are Photographer that keep track of quit statistics, such as amount of "quit-time," cigarettes not smoked, and money saved.   Many smokers find one or more of the many self-help books available useful in helping them quit and stay off tobacco.  3. LEARN NEW SKILLS AND BEHAVIORS  Try to distract yourself from urges to smoke. Talk to someone, go for a walk, or occupy your time with a task.   When you first try to quit, change your routine. Take a different route to work. Drink tea instead of coffee. Eat breakfast in a different place.   Do something to reduce your stress. Take a hot bath, exercise, or read a book.   Plan something enjoyable to do every day. Reward yourself for not smoking.   Explore interactive web-based programs that specialize in helping you quit.  4. GET MEDICINE AND USE IT CORRECTLY Medicines can help you stop smoking and decrease the urge to smoke. Combining medicine with the above behavioral methods and support can quadruple your chances of successfully  quitting smoking. The [place] Food and Drug Administration (FDA) has approved 7 medicines to help you quit smoking. These medicines fall into 3 categories.  Nicotine replacement therapy (delivers nicotine to your body without the negative effects and risks of smoking):   Nicotine gum: Available over-the-counter.   Nicotine lozenges: Available over-the-counter.   Nicotine inhaler: Available by prescription.   Nicotine nasal spray: Available by prescription.   Nicotine skin patches (transdermal): Available by prescription and over-the-counter.   Antidepressant medicine (helps people abstain from smoking, but how this works is unknown):   Bupropion sustained-release (SR) tablets: Available by prescription.   Nicotinic receptor partial agonist (simulates the effect of nicotine in your brain):   Varenicline tartrate tablets: Available by prescription.   Ask your caregiver for advice about which medicines to use and how to use them. Carefully read the information on the package.   Everyone who is trying to quit may benefit from using a medicine. If you are pregnant or trying to become pregnant, nursing an infant, you are under age 79, or you smoke fewer than 10 cigarettes per day, talk to your caregiver before taking any nicotine replacement medicines.   You should stop using a nicotine replacement product and call your caregiver if you experience nausea, dizziness, weakness, vomiting, fast or irregular heartbeat, mouth problems with the lozenge or gum, or redness or swelling of the skin around the patch that does not go away.   Do not use any other product  containing nicotine while using a nicotine replacement product.   Talk to your caregiver before using these products if you have diabetes, heart disease, asthma, stomach ulcers, you had a recent heart attack, you have high blood pressure that is not controlled with medicine, a history of irregular heartbeat, or you have been prescribed  medicine to help you quit smoking.  5. BE PREPARED FOR RELAPSE OR DIFFICULT SITUATIONS  Most relapses occur within the first 3 months after quitting. Do not be discouraged if you start smoking again. Remember, most people try several times before they finally quit.   You may have symptoms of withdrawal because your body is used to nicotine. You may crave cigarettes, be irritable, feel very hungry, cough often, get headaches, or have difficulty concentrating.   The withdrawal symptoms are only temporary. They are strongest when you first quit, but they will go away within 10 to 14 days.  Here are some difficult situations to watch for:  Alcohol. Avoid drinking alcohol. Drinking lowers your chances of successfully quitting.   Caffeine. Try to reduce the amount of caffeine you consume. It also lowers your chances of successfully quitting.   Other smokers. Being around smoking can make you want to smoke. Avoid smokers.   Weight gain. Many smokers will gain weight when they quit, usually less than 10 pounds. Eat a healthy diet and stay active. Do not let weight gain distract you from your main goal, quitting smoking. Some medicines that help you quit smoking may also help delay weight gain. You can always lose the weight gained after you quit.   Bad mood or depression. There are a lot of ways to improve your mood other than smoking.  If you are having problems with any of these situations, talk to your caregiver. SPECIAL SITUATIONS AND CONDITIONS Studies suggest that everyone can quit smoking. Your situation or condition can give you a special reason to quit.  Pregnant women/new mothers: By quitting, you protect your baby's health and your own.   Hospitalized patients: By quitting, you reduce health problems and help healing.   Heart attack patients: By quitting, you reduce your risk of a second heart attack.   Lung, head, and neck cancer patients: By quitting, you reduce your chance of a  second cancer.   Parents of children and adolescents: By quitting, you protect your children from illnesses caused by secondhand smoke.  QUESTIONS TO THINK ABOUT Think about the following questions before you try to stop smoking. You may want to talk about your answers with your caregiver.  Why do you want to quit?   If you tried to quit in the past, what helped and what did not?   What will be the most difficult situations for you after you quit? How will you plan to handle them?   Who can help you through the tough times? Your family? Friends? Caregiver?   What pleasures do you get from smoking? What ways can you still get pleasure if you quit?  Here are some questions to ask your caregiver:  How can you help me to be successful at quitting?   What medicine do you think would be best for me and how should I take it?   What should I do if I need more help?   What is smoking withdrawal like? How can I get information on withdrawal?  Quitting takes hard work and a lot of effort, but you can quit smoking. FOR MORE INFORMATION  Smokefree.gov (http://www.davis-sullivan.com/) provides  free, accurate, evidence-based information and professional assistance to help support the immediate and long-term needs of people trying to quit smoking. Document Released: 07/16/2001 Document Revised: 04/03/2011 Document Reviewed: 05/08/2009 [facility] Patient Information 2012 [place], [place].Smoking Cessation, Tips for Success YOU CAN QUIT SMOKING If you are ready to quit smoking, congratulations! You have chosen to help yourself be healthier. Cigarettes bring nicotine, tar, carbon monoxide, and other irritants into your body. Your lungs, heart, and blood vessels will be able to work better without these poisons. There are many different ways to quit smoking. Nicotine gum, nicotine patches, a nicotine inhaler, or nicotine nasal spray can help with physical craving. Hypnosis, support groups, and medicines help break  the habit of smoking. Here are some tips to help you quit for good.  Throw away all cigarettes.   Clean and remove all ashtrays from your home, work, and car.   On a card, write down your reasons for quitting. Carry the card with you and read it when you get the urge to smoke.   Cleanse your body of nicotine. Drink enough water and fluids to keep your urine clear or pale yellow. Do this after quitting to flush the nicotine from your body.   Learn to predict your moods. Do not let a bad situation be your excuse to have a cigarette. Some situations in your life might tempt you into wanting a cigarette.   Never have "just one" cigarette. It leads to wanting another and another. Remind yourself of your decision to quit.   Change habits associated with smoking. If you smoked while driving or when feeling stressed, try other activities to replace smoking. Stand up when drinking your coffee. Brush your teeth after eating. Sit in a different chair when you read the paper. Avoid alcohol while trying to quit, and try to drink fewer caffeinated beverages. Alcohol and caffeine may urge you to smoke.   Avoid foods and drinks that can trigger a desire to smoke, such as sugary or spicy foods and alcohol.   Ask people who smoke not to smoke around you.   Have something planned to do right after eating or having a cup of coffee. Take a walk or exercise to perk you up. This will help to keep you from overeating.   Try a relaxation exercise to calm you down and decrease your stress. Remember, you may be tense and nervous for the first 2 weeks after you quit, but this will pass.   Find new activities to keep your hands busy. Play with a pen, coin, or rubber band. Doodle or draw things on paper.   Brush your teeth right after eating. This will help cut down on the craving for the taste of tobacco after meals. You can try mouthwash, too.   Use oral substitutes, such as lemon drops, carrots, a cinnamon stick,  or chewing gum, in place of cigarettes. Keep them handy so they are available when you have the urge to smoke.   When you have the urge to smoke, try deep breathing.   Designate your home as a nonsmoking area.   If you are a heavy smoker, ask your caregiver about a prescription for nicotine chewing gum. It can ease your withdrawal from nicotine.   Reward yourself. Set aside the cigarette money you save and buy yourself something nice.   Look for support from others. Join a support group or smoking cessation program. Ask someone at home or at work to help you with your plan  to quit smoking.   Always ask yourself, "Do I need this cigarette or is this just a reflex?" Tell yourself, "Today, I choose not to smoke," or "I do not want to smoke." You are reminding yourself of your decision to quit, even if you do smoke a cigarette.  HOW WILL I FEEL WHEN I QUIT SMOKING?  The benefits of not smoking start within days of quitting.   You may have symptoms of withdrawal because your body is used to nicotine (the addictive substance in cigarettes). You may crave cigarettes, be irritable, feel very hungry, cough often, get headaches, or have difficulty concentrating.   The withdrawal symptoms are only temporary. They are strongest when you first quit but will go away within 10 to 14 days.   When withdrawal symptoms occur, stay in control. Think about your reasons for quitting. Remind yourself that these are signs that your body is healing and getting used to being without cigarettes.   Remember that withdrawal symptoms are easier to treat than the major diseases that smoking can cause.   Even after the withdrawal is over, expect periodic urges to smoke. However, these cravings are generally short-lived and will go away whether you smoke or not. Do not smoke!   If you relapse and smoke again, do not lose hope. Most smokers quit 3 times before they are successful.   If you relapse, do not give up! Plan  ahead and think about what you will do the next time you get the urge to smoke.  LIFE AS A NONSMOKER: MAKE IT FOR A MONTH, MAKE IT FOR LIFE Day 1: Hang this page where you will see it every day. Day 2: Get rid of all ashtrays, matches, and lighters. Day 3: Drink water. Breathe deeply between sips. Day 4: Avoid places with smoke-filled air, such as bars, clubs, or the smoking section of restaurants. Day 5: Keep track of how much money you save by not smoking. Day 6: Avoid boredom. Keep a good book with you or go to the movies. Day 7: Reward yourself! One week without smoking! Day 8: Make a dental appointment to get your teeth cleaned. Day 9: Decide how you will turn down a cigarette before it is offered to you. Day 10: Review your reasons for quitting. Day 11: Distract yourself. Stay active to keep your mind off smoking and to relieve tension. Take a walk, exercise, read a book, do a crossword puzzle, or try a new hobby. Day 12: Exercise. Get off the bus before your stop or use stairs instead of escalators. Day 13: Call on friends for support and encouragement. Day 14: Reward yourself! Two weeks without smoking! Day 15: Practice deep breathing exercises. Day 16: Bet a friend that you can stay a nonsmoker. Day 17: Ask to sit in nonsmoking sections of restaurants. Day 18: Hang up "No Smoking" signs. Day 19: Think of yourself as a nonsmoker. Day 20: Each morning, tell yourself you will not smoke. Day 21: Reward yourself! Three weeks without smoking! Day 22: Think of smoking in negative ways. Remember how it stains your teeth, gives you bad breath, and leaves you short of breath. Day 23: Eat a nutritious breakfast. Day 24:Do not relive your days as a smoker. Day 25: Hold a pencil in your hand when talking on the telephone. Day 26: Tell all your friends you do not smoke. Day 27: Think about how much better food tastes. Day 28: Remember, one cigarette is one too many. Day 29:  Take up a hobby  that will keep your hands busy. Day 30: Congratulations! One month without smoking! Give yourself a big reward. Your caregiver can direct you to community resources or hospitals for support, which may include:  Group support.   Education.   Hypnosis.   Subliminal therapy.  Document Released: 04/19/2004 Document Revised: 04/03/2011 Document Reviewed: 05/08/2009 [facility] Patient Information 2012 [place], [place].

## 2011-10-05 NOTE — ED Notes (Signed)
Patient transported to X-ray 

## 2011-10-05 NOTE — ED Provider Notes (Signed)
History     CSN: [id number]  Arrival date & time 10/05/11  4098   First MD Initiated Contact with Patient 10/05/11 0730      Chief Complaint  Patient presents with  . Chest Pain    Left sided onsetlast night    (Consider location/radiation/quality/duration/timing/severity/associated sxs/prior treatment) HPI Comments: Alejandro Jones is a 56 y.o. male who is here for cough with shortness of breath. He also has some chest tightness and pain that radiates into the left arm and off for 1-1/2 days. He had similar chest pain, and was evaluated here in January and at that time had a negative evaluation for coronary process. He was referred to primary care Dr. He has since established care at the [facility]. He continues to smoke. He has productive cough. Currently. He has no fever or chills, nausea, vomiting. He denies orthopnea  Patient is a 56 y.o. male presenting with chest pain. The history is provided by the patient.  Chest Pain     Past Medical History  Diagnosis Date  . Hypertension   . Diabetes mellitus   . Acid reflux   . Asthma     Past Surgical History  Procedure Date  . Ankle surgery   . Iliac artery stent     History reviewed. No pertinent family history.  History  Substance Use Topics  . Smoking status: Current Everyday Smoker -- 1.0 packs/day  . Smokeless tobacco: Never Used  . Alcohol Use: No      Review of Systems  Cardiovascular: Positive for chest pain.    Allergies  Latex  Home Medications   Current Outpatient Rx  Name Route Sig Dispense Refill  . ALBUTEROL SULFATE HFA 108 (90 BASE) MCG/ACT IN AERS Inhalation Inhale 2 puffs into the lungs every 6 (six) hours as needed. For breathing    . ASPIRIN EC 81 MG PO TBEC Oral Take 81 mg by mouth daily.      Marland Kitchen GLIMEPIRIDE 4 MG PO TABS Oral Take 4 mg by mouth 2 (two) times daily.      . GUAIFENESIN ER 600 MG PO TB12 Oral Take 1,200 mg by mouth 2 (two) times daily.    Marland Kitchen LOSARTAN  POTASSIUM-HCTZ 100-25 MG PO TABS Oral Take 1 tablet by mouth daily.      Marland Kitchen OMEPRAZOLE 20 MG PO CPDR Oral Take 20 mg by mouth daily.      Marland Kitchen SAXAGLIPTIN-METFORMIN ER 12-998 MG PO TB24 Oral Take 1 tablet by mouth daily.      Marland Kitchen PREDNISONE 20 MG PO TABS Oral Take 1 tablet (20 mg total) by mouth 2 (two) times daily. 10 tablet 0    BP 125/95  Pulse 80  Temp(Src) 97.7 F (36.5 C) (Oral)  Resp 18  SpO2 97%  Physical Exam  ED Course  Procedures (including critical care time) Emergency department treatment: Albuterol and Atrovent nebulizer, prednisone by mouth, albuterol inhaler with spacer     Date: 10/05/2011  Rate: 85  Rhythm: normal sinus rhythm and premature atrial contractions (PAC)  QRS Axis: normal  Intervals: normal  ST/T Wave abnormalities: nonspecific ST changes  Conduction Disutrbances:none  Narrative Interpretation:   Old EKG Reviewed: unchanged   Labs Reviewed - No data to display Dg Chest 2 View  10/05/2011  *RADIOLOGY REPORT*  Clinical Data: 56 year old male with chest pain and shortness of breath.  CHEST - 2 VIEW  Comparison: 08/09/2011 and prior chest radiographs  Findings: The cardiomediastinal silhouette is unremarkable. The lungs are  clear. There is no evidence of focal airspace disease, pulmonary edema, suspicious pulmonary nodule/mass, pleural effusion, or pneumothorax. No acute bony abnormalities are identified.  IMPRESSION: No evidence of acute cardiopulmonary disease.  Original Report Authenticated By: Rosendo Gros, M.D.     1. Bronchitis with bronchospasm   2. Tobacco abuse       MDM  Evaluation consistent with bronchitis and bronchospasm. Doubt ACS, PE, occult infection or metabolic instability.  Plan: Home Medications- Home inhaler and Prednisone; Home Treatments- Stop Smoking; Recommended follow up- PCP prn        Flint Melter, MD 10/05/11 [phone]

## 2011-10-05 NOTE — ED Notes (Signed)
Pt reports left sided chest pain onset last night that radiates down left arm.

## 2012-06-28 ENCOUNTER — Emergency Department (HOSPITAL_COMMUNITY)
Admission: EM | Admit: 2012-06-28 | Discharge: 2012-06-28 | Disposition: A | Discharge status: Home / Self Care | Payer: Self-pay | Attending: Emergency Medicine | Admitting: Emergency Medicine

## 2012-06-28 ENCOUNTER — Encounter (HOSPITAL_COMMUNITY): Payer: Self-pay | Admitting: *Deleted

## 2012-06-28 DIAGNOSIS — K219 Gastro-esophageal reflux disease without esophagitis: Secondary | ICD-10-CM | POA: Insufficient documentation

## 2012-06-28 DIAGNOSIS — Z79899 Other long term (current) drug therapy: Secondary | ICD-10-CM | POA: Insufficient documentation

## 2012-06-28 DIAGNOSIS — F17200 Nicotine dependence, unspecified, uncomplicated: Secondary | ICD-10-CM | POA: Insufficient documentation

## 2012-06-28 DIAGNOSIS — Z7982 Long term (current) use of aspirin: Secondary | ICD-10-CM | POA: Insufficient documentation

## 2012-06-28 DIAGNOSIS — J45909 Unspecified asthma, uncomplicated: Secondary | ICD-10-CM | POA: Insufficient documentation

## 2012-06-28 DIAGNOSIS — E119 Type 2 diabetes mellitus without complications: Secondary | ICD-10-CM | POA: Insufficient documentation

## 2012-06-28 DIAGNOSIS — K6289 Other specified diseases of anus and rectum: Secondary | ICD-10-CM | POA: Insufficient documentation

## 2012-06-28 DIAGNOSIS — I10 Essential (primary) hypertension: Secondary | ICD-10-CM | POA: Insufficient documentation

## 2012-06-28 DIAGNOSIS — K644 Residual hemorrhoidal skin tags: Secondary | ICD-10-CM | POA: Insufficient documentation

## 2012-06-28 MED ORDER — HYDROCODONE-ACETAMINOPHEN 5-325 MG PO TABS: 1.0000 | ORAL_TABLET | ORAL | Status: DC | PRN

## 2012-06-28 MED ORDER — HYDROCODONE-ACETAMINOPHEN 5-325 MG PO TABS
1.0000 | ORAL_TABLET | Freq: Once | ORAL | Status: AC
  Administered 2012-06-28: 1 via ORAL
  Filled 2012-06-28: qty 1

## 2012-06-28 MED ORDER — HYDROCORTISONE 2.5 % RE CREA: TOPICAL_CREAM | RECTAL | Status: DC

## 2012-06-28 NOTE — ED Provider Notes (Signed)
Medical screening examination/treatment/procedure(s) were performed by non-physician practitioner and as supervising physician I was immediately available for consultation/collaboration.    Nelia Shi, MD 06/28/12 [phone]

## 2012-06-28 NOTE — ED Notes (Signed)
Pt presents to department for evaluation of hemorrhoids. Pt states ongoing x1 week. States pain and bleeding with defection. Also states dysuria. Denies abdominal pain. 8/10 pain at the time. Pt is alert and oriented x4. No relief with rectal suppositories.

## 2012-06-28 NOTE — ED Notes (Signed)
Pt discharged home, wife at bedside to drive.

## 2012-06-28 NOTE — ED Notes (Signed)
Reports having severe pain related to hemorrhoids. Went to pcp recently and trying otc meds with no relief.

## 2012-06-28 NOTE — ED Provider Notes (Signed)
History     CSN: [id number]  Arrival date & time 06/28/12  1258   First MD Initiated Contact with Patient 06/28/12 1424      Chief Complaint  Patient presents with  . Hemorrhoids    (Consider location/radiation/quality/duration/timing/severity/associated sxs/prior treatment) Patient is a 56 y.o. male presenting with hematochezia. The history is provided by the patient.  Rectal Bleeding  The current episode started more than 1 week ago. The pain is severe. The stool is described as hard. Associated symptoms include rectal pain. Pertinent negatives include no fever and no abdominal pain. Associated symptoms comments: He reports a history of hemorrhoids that have been painful for approximately 2 weeks. No bleeding. He has tried over the counter products without relief..    Past Medical History  Diagnosis Date  . Hypertension   . Diabetes mellitus   . Acid reflux   . Asthma     Past Surgical History  Procedure Date  . Ankle surgery   . Iliac artery stent     History reviewed. No pertinent family history.  History  Substance Use Topics  . Smoking status: Current Every Day Smoker -- 1.0 packs/day  . Smokeless tobacco: Never Used  . Alcohol Use: No      Review of Systems  Constitutional: Negative for fever.  Gastrointestinal: Positive for hematochezia and rectal pain. Negative for abdominal pain.    Allergies  Latex  Home Medications   Current Outpatient Rx  Name  Route  Sig  Dispense  Refill  . ALBUTEROL SULFATE HFA 108 (90 BASE) MCG/ACT IN AERS   Inhalation   Inhale 2 puffs into the lungs every 6 (six) hours as needed. For breathing         . ALPRAZOLAM 0.5 MG PO TABS   Oral   Take 0.5 mg by mouth at bedtime as needed. For sleep         . ASPIRIN EC 81 MG PO TBEC   Oral   Take 81 mg by mouth daily.           Marland Kitchen ESOMEPRAZOLE MAGNESIUM 40 MG PO CPDR   Oral   Take 40 mg by mouth daily before breakfast.         . GLIPIZIDE ER 5 MG PO TB24  Oral   Take 10 mg by mouth daily.         Marland Kitchen HYDROXYZINE HCL 25 MG PO TABS   Oral   Take 25 mg by mouth 3 (three) times daily as needed. For itching         . OLMESARTAN MEDOXOMIL-HCTZ 20-12.5 MG PO TABS   Oral   Take 1 tablet by mouth daily.         Marland Kitchen PHENYLEPH-SHARK LIV OIL-MO-PET 0.25-3-14-71.9 % RE OINT   Rectal   Place 1 application rectally 2 (two) times daily as needed. For hemorrhoids         . SAXAGLIPTIN-METFORMIN ER 12-998 MG PO TB24   Oral   Take 1 tablet by mouth daily.           Marland Kitchen PE-SHARK LIVER OIL-COCOA BUTTR 0.25-3-85.5 % RE SUPP   Rectal   Place 1 suppository rectally as needed. For hemorrhoids         . TRAMADOL HCL 50 MG PO TABS   Oral   Take 50 mg by mouth every 6 (six) hours as needed. For pain         . PREPARATION H EX   Apply externally  Apply 1 application topically every 6 (six) hours as needed. For hemorrhoids           BP 103/66  Temp 97.8 F (36.6 C) (Oral)  Resp 18  SpO2 96%  Physical Exam  Constitutional: He appears well-developed and well-nourished. No distress.  Abdominal: There is no tenderness.  Genitourinary:       Large external hemorrhoid without thrombosis. No active bleeding. Significantly tender.     ED Course  Procedures (including critical care time)  Labs Reviewed - No data to display No results found.   No diagnosis found.  1. External hemorrhoid  MDM  Uncomplicated external hemorrhoid.        Rodena Medin, PA-C 06/28/12 [phone]

## 2012-07-30 ENCOUNTER — Emergency Department (HOSPITAL_COMMUNITY)
Admission: EM | Admit: 2012-07-30 | Discharge: 2012-07-30 | Disposition: A | Discharge status: Home / Self Care | Payer: Self-pay | Attending: Emergency Medicine | Admitting: Emergency Medicine

## 2012-07-30 ENCOUNTER — Encounter (HOSPITAL_COMMUNITY): Payer: Self-pay | Admitting: Emergency Medicine

## 2012-07-30 DIAGNOSIS — E119 Type 2 diabetes mellitus without complications: Secondary | ICD-10-CM | POA: Insufficient documentation

## 2012-07-30 DIAGNOSIS — K219 Gastro-esophageal reflux disease without esophagitis: Secondary | ICD-10-CM | POA: Insufficient documentation

## 2012-07-30 DIAGNOSIS — K644 Residual hemorrhoidal skin tags: Secondary | ICD-10-CM | POA: Insufficient documentation

## 2012-07-30 DIAGNOSIS — J45909 Unspecified asthma, uncomplicated: Secondary | ICD-10-CM | POA: Insufficient documentation

## 2012-07-30 DIAGNOSIS — K5900 Constipation, unspecified: Secondary | ICD-10-CM | POA: Insufficient documentation

## 2012-07-30 DIAGNOSIS — K602 Anal fissure, unspecified: Secondary | ICD-10-CM

## 2012-07-30 DIAGNOSIS — Z79899 Other long term (current) drug therapy: Secondary | ICD-10-CM | POA: Insufficient documentation

## 2012-07-30 DIAGNOSIS — R110 Nausea: Secondary | ICD-10-CM | POA: Insufficient documentation

## 2012-07-30 DIAGNOSIS — Z7982 Long term (current) use of aspirin: Secondary | ICD-10-CM | POA: Insufficient documentation

## 2012-07-30 DIAGNOSIS — K6289 Other specified diseases of anus and rectum: Secondary | ICD-10-CM | POA: Insufficient documentation

## 2012-07-30 DIAGNOSIS — I10 Essential (primary) hypertension: Secondary | ICD-10-CM | POA: Insufficient documentation

## 2012-07-30 HISTORY — DX: Constipation, unspecified: K59.00

## 2012-07-30 MED ORDER — FLEET ENEMA 7-19 GM/118ML RE ENEM
1.0000 | ENEMA | Freq: Once | RECTAL | Status: DC
  Filled 2012-07-30: qty 1

## 2012-07-30 MED ORDER — DOCUSATE SODIUM 100 MG PO CAPS: 100.0000 mg | ORAL_CAPSULE | Freq: Two times a day (BID) | ORAL | Status: DC

## 2012-07-30 MED ORDER — LIDOCAINE-HYDROCORTISONE ACE 3-0.5 % RE CREA: 1.0000 | TOPICAL_CREAM | Freq: Two times a day (BID) | RECTAL | Status: DC

## 2012-07-30 MED ORDER — NITROGLYCERIN 2 % TD OINT: 1.0000 [in_us] | TOPICAL_OINTMENT | Freq: Four times a day (QID) | TRANSDERMAL | Status: DC

## 2012-07-30 MED ORDER — NITROGLYCERIN 2 % TD OINT
1.0000 [in_us] | TOPICAL_OINTMENT | Freq: Four times a day (QID) | TRANSDERMAL | Status: DC
  Administered 2012-07-30: 1 [in_us] via TOPICAL
  Filled 2012-07-30: qty 1

## 2012-07-30 NOTE — ED Provider Notes (Signed)
Patient sent to the CDU for further treatment for his constipation. She has a history of hemorrhoids and this is an ongoing issue for him. He was seen originally by Dr. Silverio Lay who did a digital rectal exam and said that there are no hemorrhoids notable at this time. He has been constipated for 3 days. Patient has been treated by surgery for this. Dr. Silverio Lay consulted surgery.  They recommended: 1. Sitz bath TID 2. Lidocaine cream for pain 3. Nitro ointment BID for 4 weeks 4: stool softners  Pt refused fleet enema in ED but will take home and used. He is ready to be discharged.  Pt has been advised of the symptoms that warrant their return to the ED. Patient has voiced understanding and has agreed to follow-up with the PCP or specialist.   Dorthula Matas, PA 07/30/12 1206

## 2012-07-30 NOTE — ED Notes (Signed)
Pt undressed, in gown, on continuous pulse oximetry and blood pressure cuff; family at bedside 

## 2012-07-30 NOTE — Consult Note (Signed)
Chief Complaint  Patient presents with  . Abdominal Pain    HISTORY: Dayon Witt is a 56 y.o. male who presents to the ED with rectal pain.  Other symptoms include bleeding.  He has tried warm baths and compresses in the past with some success.  BM's makes the symptoms worse.  This had been occurring for several weeks.  He has not had a BM in 3 days.  It is continuous in nature.  His bowel habits are regular and his bowel movements are hard.  His fiber intake is moderate.     Past Medical History  Diagnosis Date  . Hypertension   . Diabetes mellitus   . Acid reflux   . Asthma   . Constipation       Past Surgical History  Procedure Date  . Ankle surgery   . Iliac artery stent         Current Facility-Administered Medications  Medication Dose Route Frequency Provider Last Rate Last Dose  . nitroGLYCERIN (NITROGLYN) 2 % ointment 1 inch  1 inch Topical Q6H Richardean Canal, MD      . sodium phosphate (FLEET) 7-19 GM/118ML enema 1 enema  1 enema Rectal Once Richardean Canal, MD       Current Outpatient Prescriptions  Medication Sig Dispense Refill  . albuterol (PROVENTIL HFA;VENTOLIN HFA) 108 (90 BASE) MCG/ACT inhaler Inhale 2 puffs into the lungs every 6 (six) hours as needed. For shortness of breath.      . ALPRAZolam (XANAX) 0.5 MG tablet Take 0.5 mg by mouth at bedtime as needed. For sleep      . aspirin EC 81 MG tablet Take 81 mg by mouth daily.        Marland Kitchen esomeprazole (NEXIUM) 40 MG capsule Take 40 mg by mouth daily before breakfast.      . glipiZIDE (GLUCOTROL XL) 5 MG 24 hr tablet Take 10 mg by mouth daily.      . hydrocortisone (ANUSOL-HC) 2.5 % rectal cream Place 1 application rectally 2 (two) times daily.      . hydrOXYzine (ATARAX/VISTARIL) 25 MG tablet Take 25 mg by mouth 3 (three) times daily as needed. For itching      . ibuprofen (ADVIL,MOTRIN) 200 MG tablet Take 400 mg by mouth every 6 (six) hours as needed. For pain.      . magnesium hydroxide (MILK OF MAGNESIA) 400  MG/5ML suspension Take 15 mLs by mouth daily as needed. For constipation.      Marland Kitchen olmesartan-hydrochlorothiazide (BENICAR HCT) 20-12.5 MG per tablet Take 1 tablet by mouth daily.      . phenylephrine-shark liver oil-mineral oil-petrolatum (PREPARATION H) 0.25-3-14-71.9 % rectal ointment Place 1 application rectally 2 (two) times daily as needed. For hemorrhoids      . Saxagliptin-Metformin (KOMBIGLYZE XR) 12-998 MG TB24 Take 1 tablet by mouth daily.        . shark liver oil-cocoa butter (PREPARATION H) 0.25-3-85.5 % suppository Place 1 suppository rectally daily as needed. For hemorrhoids      . traMADol (ULTRAM) 50 MG tablet Take 50 mg by mouth every 6 (six) hours as needed. For pain      . Witch Hazel (PREPARATION H EX) Apply 1 application topically every 6 (six) hours as needed. For hemorrhoids          Allergies  Allergen Reactions  . Latex Itching      No family history on file.  History   Social History  . Marital Status:  Married    Spouse Name: N/A    Number of Children: N/A  . Years of Education: N/A   Social History Main Topics  . Smoking status: Current Every Day Smoker -- 1.0 packs/day  . Smokeless tobacco: Never Used  . Alcohol Use: No  . Drug Use: No  . Sexually Active:    Other Topics Concern  . None   Social History Narrative  . None      REVIEW OF SYSTEMS - PERTINENT POSITIVES ONLY: Review of Systems - General ROS: negative for - chills or fever Hematological and Lymphatic ROS: negative for - bleeding problems or blood clots Gastrointestinal ROS: positive for - abdominal pain and constipation negative for - nausea/vomiting  EXAM: Filed Vitals:   07/30/12 0921  BP: 160/104  Pulse: 107  Temp: 98.7 F (37.1 C)  Resp: 18    General appearance: alert, cooperative and mild distress Resp: clear to auscultation bilaterally Cardio: regular rate and rhythm GI: soft, non-tender; bowel sounds normal; no masses,  no organomegaly Perianal: moderate external  hemorrhoids without thrombosis, pain to palpation in posterior anal canal  LABORATORY RESULTS: Available labs are reviewed  Lab Results  Component Value Date   WBC 10.1 08/13/2008   HGB 14.8 08/13/2008   HCT 44.5 08/13/2008   MCV 96.2 08/13/2008   PLT 353 08/13/2008     ASSESSMENT AND PLAN:  Lyndon Chapel is a 56 y.o. M with a recent history of constipation that appears to have a posterior anal fissure.  I was unable to do a complete exam, but I did not see any superficial abscesses.  I cannot rule out any deep abscesses but given his lack of fevers and normal wbc, I think this would not be extremely likely given his history.  I would recommend stool softeners and fiber supplements daily, no narcotics, lidocaine cream for pain, nitroglycerin ointment placed BID for a total of 4 weeks and sitz baths TID to help relieve the pain.  If he cannot have a bowel movement in the next 24h, I would recommend a fleets enema.  He is welcome to come see me in clinic for follow up.   Vanita Panda, MD Colon and Rectal Surgery / General Surgery [facility]      Visit Diagnoses: Anal fissure Primary Care Physician: Quitman Livings, MD

## 2012-07-30 NOTE — ED Notes (Signed)
Patient discharged with his wife instructions given and patient verbalized an understanding. NAD noted at the time of discharge.

## 2012-07-30 NOTE — ED Provider Notes (Signed)
History     CSN: [id number]  Arrival date & time 07/30/12  0902   First MD Initiated Contact with Patient 07/30/12 [phone]      Chief Complaint  Patient presents with  . Abdominal Pain    (Consider location/radiation/quality/duration/timing/severity/associated sxs/prior treatment) The history is provided by the patient.  Alejandro Jones is a 56 y.o. male hx of hemorrhoids, DM, here with abdominal pain, constipation, rectal pain. Has hx of hemorrhoids and he felt severe pain when defecating for the last week. Last bowel movement was 3 days ago. Took miralax without improvement. + L sided abdominal pain. No fevers. + nausea no vomiting.    Past Medical History  Diagnosis Date  . Hypertension   . Diabetes mellitus   . Acid reflux   . Asthma   . Constipation     Past Surgical History  Procedure Date  . Ankle surgery   . Iliac artery stent     No family history on file.  History  Substance Use Topics  . Smoking status: Current Every Day Smoker -- 1.0 packs/day  . Smokeless tobacco: Never Used  . Alcohol Use: No      Review of Systems  Gastrointestinal: Positive for nausea, abdominal pain and rectal pain.  All other systems reviewed and are negative.    Allergies  Latex  Home Medications   Current Outpatient Rx  Name  Route  Sig  Dispense  Refill  . ALBUTEROL SULFATE HFA 108 (90 BASE) MCG/ACT IN AERS   Inhalation   Inhale 2 puffs into the lungs every 6 (six) hours as needed. For shortness of breath.         . ALPRAZOLAM 0.5 MG PO TABS   Oral   Take 0.5 mg by mouth at bedtime as needed. For sleep         . ASPIRIN EC 81 MG PO TBEC   Oral   Take 81 mg by mouth daily.           Marland Kitchen ESOMEPRAZOLE MAGNESIUM 40 MG PO CPDR   Oral   Take 40 mg by mouth daily before breakfast.         . GLIPIZIDE ER 5 MG PO TB24   Oral   Take 10 mg by mouth daily.         Marland Kitchen HYDROCORTISONE 2.5 % RE CREA   Rectal   Place 1 application rectally 2 (two) times  daily.         Marland Kitchen HYDROXYZINE HCL 25 MG PO TABS   Oral   Take 25 mg by mouth 3 (three) times daily as needed. For itching         . IBUPROFEN 200 MG PO TABS   Oral   Take 400 mg by mouth every 6 (six) hours as needed. For pain.         Marland Kitchen MAGNESIUM HYDROXIDE 400 MG/5ML PO SUSP   Oral   Take 15 mLs by mouth daily as needed. For constipation.         Marland Kitchen OLMESARTAN MEDOXOMIL-HCTZ 20-12.5 MG PO TABS   Oral   Take 1 tablet by mouth daily.         Marland Kitchen PHENYLEPH-SHARK LIV OIL-MO-PET 0.25-3-14-71.9 % RE OINT   Rectal   Place 1 application rectally 2 (two) times daily as needed. For hemorrhoids         . SAXAGLIPTIN-METFORMIN ER 12-998 MG PO TB24   Oral   Take 1 tablet by mouth daily.           Marland Kitchen  PE-SHARK LIVER OIL-COCOA BUTTR 0.25-3-85.5 % RE SUPP   Rectal   Place 1 suppository rectally daily as needed. For hemorrhoids         . TRAMADOL HCL 50 MG PO TABS   Oral   Take 50 mg by mouth every 6 (six) hours as needed. For pain         . PREPARATION H EX   Apply externally   Apply 1 application topically every 6 (six) hours as needed. For hemorrhoids         . DOCUSATE SODIUM 100 MG PO CAPS   Oral   Take 1 capsule (100 mg total) by mouth every 12 (twelve) hours.   60 capsule   0   . LIDOCAINE-HYDROCORTISONE ACE 3-0.5 % RE CREA   Rectal   Place 1 Applicatorful rectally 2 (two) times daily.   7 g   0   . NITROGLYCERIN 2 % TD OINT   Transdermal   Place 1 inch onto the skin every 6 (six) hours.   30 g   0     Apply to anal region     BP 145/90  Pulse 104  Temp 98.6 F (37 C) (Oral)  Resp 20  Ht 6' (1.829 m)  Wt 220 lb (99.791 kg)  BMI 29.84 kg/m2  SpO2 97%  Physical Exam  Nursing note and vitals reviewed. Constitutional: He is oriented to person, place, and time.       Uncomfortable, crying in pain   HENT:  Head: Normocephalic.  Mouth/Throat: Oropharynx is clear and moist.  Eyes: Conjunctivae normal are normal. Pupils are equal, round, and  reactive to light.  Neck: Normal range of motion. Neck supple.  Cardiovascular: Normal rate, regular rhythm and normal heart sounds.   Pulmonary/Chest: Effort normal and breath sounds normal. No respiratory distress. He has no wheezes. He has no rales.  Abdominal:       Firm, + LLQ tenderness, no rebound. Rectal- small external hemorrhoid, but sphincter is closed and unable to do digital exam. No obvious thrombosed hemorrhoid.   Musculoskeletal: Normal range of motion.  Neurological: He is alert and oriented to person, place, and time.  Skin: Skin is warm and dry.  Psychiatric: He has a normal mood and affect. His behavior is normal. Judgment and thought content normal.    ED Course  Procedures (including critical care time)  Labs Reviewed - No data to display No results found.   1. Constipation       MDM  Alejandro Jones is a 56 y.o. male hx of hemorrhoids here with constipation, likely from hemorrhoids.    10 AM I called surgical PA to see the patient. I also applied nitro paste to his rectum and now I am able to do a digital rectal exam. He has hard stool at the vault.   11 AM My PA Zella Ball performed a digital disimpaction. But patient unable to tolerate very well. I ordered enema.   12 PM Patient transferred to CDU to get enema. However, he refused. Surgery saw the patient and recommend sitz bath, lidocaine cream, nitro ointment, and stool softeners and outpatient f/u. They think that he may have an anal fissure. He will f/u with surgery next week. The CDU PA discharged the patient after discussion with me.        Richardean Canal, MD 07/31/12 [phone]

## 2012-07-30 NOTE — ED Notes (Signed)
Pt with c/o abd pain, constipation. Passing gas, no BM x3 days, hx of constipation

## 2012-07-30 NOTE — ED Notes (Signed)
Report given to RN in CDU

## 2012-07-31 NOTE — ED Provider Notes (Signed)
Medical screening examination/treatment/procedure(s) were conducted as a shared visit with non-physician practitioner(s) and myself.  I personally evaluated the patient during the encounter  See my separate note   Richardean Canal, MD 07/31/12 [phone]

## 2012-10-05 ENCOUNTER — Emergency Department (HOSPITAL_COMMUNITY)
Admission: EM | Admit: 2012-10-05 | Discharge: 2012-10-05 | Disposition: A | Discharge status: Home / Self Care | Payer: Self-pay | Attending: Emergency Medicine | Admitting: Emergency Medicine

## 2012-10-05 ENCOUNTER — Encounter (HOSPITAL_COMMUNITY): Payer: Self-pay

## 2012-10-05 ENCOUNTER — Encounter (HOSPITAL_COMMUNITY): Payer: Self-pay | Admitting: Cardiology

## 2012-10-05 ENCOUNTER — Emergency Department (HOSPITAL_COMMUNITY)
Admission: EM | Admit: 2012-10-05 | Discharge: 2012-10-05 | Disposition: A | Discharge status: Nursing Home (Includes ICF) | Payer: Self-pay | Source: Home / Self Care

## 2012-10-05 DIAGNOSIS — J45909 Unspecified asthma, uncomplicated: Secondary | ICD-10-CM | POA: Insufficient documentation

## 2012-10-05 DIAGNOSIS — I10 Essential (primary) hypertension: Secondary | ICD-10-CM

## 2012-10-05 DIAGNOSIS — Z79899 Other long term (current) drug therapy: Secondary | ICD-10-CM | POA: Insufficient documentation

## 2012-10-05 DIAGNOSIS — I739 Peripheral vascular disease, unspecified: Secondary | ICD-10-CM | POA: Insufficient documentation

## 2012-10-05 DIAGNOSIS — K219 Gastro-esophageal reflux disease without esophagitis: Secondary | ICD-10-CM | POA: Insufficient documentation

## 2012-10-05 DIAGNOSIS — F17200 Nicotine dependence, unspecified, uncomplicated: Secondary | ICD-10-CM | POA: Insufficient documentation

## 2012-10-05 DIAGNOSIS — E119 Type 2 diabetes mellitus without complications: Secondary | ICD-10-CM | POA: Insufficient documentation

## 2012-10-05 DIAGNOSIS — K649 Unspecified hemorrhoids: Secondary | ICD-10-CM

## 2012-10-05 DIAGNOSIS — Z7982 Long term (current) use of aspirin: Secondary | ICD-10-CM | POA: Insufficient documentation

## 2012-10-05 MED ORDER — GLIPIZIDE ER 5 MG PO TB24: 10.0000 mg | ORAL_TABLET | Freq: Every day | ORAL | Status: DC

## 2012-10-05 MED ORDER — OLMESARTAN MEDOXOMIL-HCTZ 20-12.5 MG PO TABS: 1.0000 | ORAL_TABLET | Freq: Every day | ORAL | Status: DC

## 2012-10-05 MED ORDER — NITROGLYCERIN 2 % TD OINT: 1.0000 [in_us] | TOPICAL_OINTMENT | Freq: Four times a day (QID) | TRANSDERMAL | Status: DC

## 2012-10-05 MED ORDER — ESOMEPRAZOLE MAGNESIUM 40 MG PO CPDR: 40.0000 mg | DELAYED_RELEASE_CAPSULE | Freq: Every day | ORAL | Status: DC

## 2012-10-05 MED ORDER — SAXAGLIPTIN-METFORMIN ER 5-1000 MG PO TB24: 1.0000 | ORAL_TABLET | Freq: Every day | ORAL | Status: DC

## 2012-10-05 MED ORDER — HYDROCORTISONE 2.5 % RE CREA: 1.0000 "application " | TOPICAL_CREAM | Freq: Two times a day (BID) | RECTAL | Status: DC

## 2012-10-05 NOTE — ED Provider Notes (Signed)
History     CSN: [id number]  Arrival date & time 10/05/12  1535  57 y.o. male who presents to urgent care with pain in his left foot. He has also been constipated for about 2 days, and states that his hemorrhoids have been more painful. He does take a stool softener. He ran out of his nitroglycerin ointment for his hemorrhoids 2 days ago.Other symptoms include bleeding. He has tried warm baths and compresses in the past with some success. BM's makes the symptoms worse.   He also complains of left foot pain and describes as left toe as having a purplish hue , he has been using ibuprofen as well as icy hot packs to his left leg to relieve the pain he also describes claudication with ambulation. He states that his diabetes has been poorly controlled. He also states that he has a stent in his left leg, with restenosis in 2008. 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  Chief Complaint  Patient presents with  . Diabetes  . Leg Pain    (Consider location/radiation/quality/duration/timing/severity/associated sxs/prior treatment) HPI  Past Medical History  Diagnosis Date  . Hypertension   . Diabetes mellitus   . Acid reflux   . Asthma   . Constipation     Past Surgical History  Procedure Laterality Date  . Ankle surgery    . Iliac artery stent      No family history on file.  History  Substance Use Topics  . Smoking status: Current Every Day Smoker -- 1.00 packs/day  . Smokeless tobacco: Never Used  . Alcohol Use: No      Review of Systems As documented in history of present illness Allergies  Latex  Home Medications   Current Outpatient Rx  Name  Route  Sig  Dispense  Refill  . albuterol (PROVENTIL HFA;VENTOLIN HFA) 108 (90 BASE) MCG/ACT inhaler   Inhalation   Inhale 2 puffs into the lungs every 6 (six) hours as needed. For shortness of breath.         . ALPRAZolam (XANAX) 0.5 MG tablet   Oral   Take 0.5 mg by mouth at bedtime as needed. For sleep         .  aspirin EC 81 MG tablet   Oral   Take 81 mg by mouth daily.           Marland Kitchen docusate sodium (COLACE) 100 MG capsule   Oral   Take 1 capsule (100 mg total) by mouth every 12 (twelve) hours.   60 capsule   0   . esomeprazole (NEXIUM) 40 MG capsule   Oral   Take 1 capsule (40 mg total) by mouth daily before breakfast.   30 capsule   2   . glipiZIDE (GLUCOTROL XL) 5 MG 24 hr tablet   Oral   Take 2 tablets (10 mg total) by mouth daily.   30 tablet   0   . hydrocortisone (ANUSOL-HC) 2.5 % rectal cream   Rectal   Place 1 application rectally 2 (two) times daily.   30 g   3   . hydrOXYzine (ATARAX/VISTARIL) 25 MG tablet   Oral   Take 25 mg by mouth 3 (three) times daily as needed. For itching         . ibuprofen (ADVIL,MOTRIN) 200 MG tablet   Oral   Take 400 mg by mouth every 6 (six) hours as needed. For pain.         Marland Kitchen lidocaine-hydrocortisone (  ANAMANTEL HC) 3-0.5 % CREA   Rectal   Place 1 Applicatorful rectally 2 (two) times daily.   7 g   0   . magnesium hydroxide (MILK OF MAGNESIA) 400 MG/5ML suspension   Oral   Take 15 mLs by mouth daily as needed. For constipation.         . nitroGLYCERIN (NITROGLYN) 2 % ointment   Transdermal   Place 1 inch onto the skin every 6 (six) hours.   30 g   2     Apply to anal region   . olmesartan-hydrochlorothiazide (BENICAR HCT) 20-12.5 MG per tablet   Oral   Take 1 tablet by mouth daily.   30 tablet   0   . phenylephrine-shark liver oil-mineral oil-petrolatum (PREPARATION H) 0.25-3-14-71.9 % rectal ointment   Rectal   Place 1 application rectally 2 (two) times daily as needed. For hemorrhoids         . Saxagliptin-Metformin (KOMBIGLYZE XR) 12-998 MG TB24   Oral   Take 1 tablet by mouth daily.   30 tablet   0   . shark liver oil-cocoa butter (PREPARATION H) 0.25-3-85.5 % suppository   Rectal   Place 1 suppository rectally daily as needed. For hemorrhoids         . traMADol (ULTRAM) 50 MG tablet   Oral    Take 50 mg by mouth every 6 (six) hours as needed. For pain         . Witch Hazel (PREPARATION H EX)   Apply externally   Apply 1 application topically every 6 (six) hours as needed. For hemorrhoids           BP 170/111  Pulse 68  Temp(Src) 97.7 F (36.5 C) (Oral)  SpO2 98%  Physical Exam General appearance: alert, cooperative and mild distress  Resp: clear to auscultation bilaterally  Cardio: regular rate and rhythm  GI: soft, non-tender; bowel sounds normal; no masses, no organomegaly  Perianal: moderate external hemorrhoids without thrombosis, pain to palpation in posterior anal canal Extremities decreased distal, posterior tibial and popliteal pulse on the left side, the foot appears to be cold and clammy ED Course  Procedures (including critical care time)  Labs Reviewed - No data to display No results found.   No diagnosis found.    MDM  #1 ischemic toe with peripheral vascular disease, with history of stent in the left lower extremity. His records need to be obtained from his vascular surgeon in [place]. He is currently on aspirin. Because of inability to feel any pulsations in his left foot, and the fact that the patient's foot appears cold and clammy he is being urgently refer to the ED for further evaluation as well as vascular surgery consultation an ABI  #2 diabetes we'll check a hemoglobin A1c #3 hypertension blood pressure medications have been refilled #4 hemorrhoids will provide a referral to see [facility] [place] [facility], refills on nitroglycerin ointment and Anusol a.c. Have been provided         Richarda Overlie, MD 10/05/12 1609

## 2012-10-05 NOTE — ED Notes (Signed)
Pt reports he was transferred here from [facility] for evaluation of his left foot, states he has pain in his left leg. States they had a hard time palpating a pulse at his check up. Cap refill less than 3 seconds.

## 2012-10-05 NOTE — ED Provider Notes (Signed)
History     CSN: [id number]  Arrival date & time 10/05/12  1616   First MD Initiated Contact with Patient 10/05/12 1745      Chief Complaint  Patient presents with  . Pain    HPI Patient was transferred from the urgent care for evaluation of his left foot. Patient states he has issues with pain in his foot with walking. He has a history of diabetes and peripheral vascular disease. He continues to smoke cigarettes. Patient had a stent placed in his left leg. He had restenosis in 2008. Patient had been seeing a vascular surgeon in [place] but has now moved in [place]. At the urgent care, they have noted that his great toe appeared cyanotic. Documentation indicates they could not palpate a pulse in his foot and it appeared cold and clammy. Patient states his symptoms were not bothering him that much. He does not feel like his foot appears blue to him. He states he has chronic problems with that leg. Past Medical History  Diagnosis Date  . Hypertension   . Diabetes mellitus   . Acid reflux   . Asthma   . Constipation     Past Surgical History  Procedure Laterality Date  . Ankle surgery    . Iliac artery stent      History reviewed. No pertinent family history.  History  Substance Use Topics  . Smoking status: Current Every Day Smoker -- 1.00 packs/day  . Smokeless tobacco: Never Used  . Alcohol Use: No      Review of Systems  All other systems reviewed and are negative.    Allergies  Latex  Home Medications   Current Outpatient Rx  Name  Route  Sig  Dispense  Refill  . albuterol (PROVENTIL HFA;VENTOLIN HFA) 108 (90 BASE) MCG/ACT inhaler   Inhalation   Inhale 2 puffs into the lungs every 6 (six) hours as needed. For shortness of breath.         . ALPRAZolam (XANAX) 0.5 MG tablet   Oral   Take 0.5 mg by mouth at bedtime as needed. For sleep         . aspirin EC 81 MG tablet   Oral   Take 81 mg by mouth daily.           Marland Kitchen docusate sodium (COLACE)  100 MG capsule   Oral   Take 1 capsule (100 mg total) by mouth every 12 (twelve) hours.   60 capsule   0   . esomeprazole (NEXIUM) 40 MG capsule   Oral   Take 1 capsule (40 mg total) by mouth daily before breakfast.   30 capsule   2   . glipiZIDE (GLUCOTROL XL) 5 MG 24 hr tablet   Oral   Take 2 tablets (10 mg total) by mouth daily.   30 tablet   0   . hydrocortisone (ANUSOL-HC) 2.5 % rectal cream   Rectal   Place 1 application rectally 2 (two) times daily.   30 g   3   . hydrOXYzine (ATARAX/VISTARIL) 25 MG tablet   Oral   Take 25 mg by mouth 3 (three) times daily as needed. For itching         . ibuprofen (ADVIL,MOTRIN) 200 MG tablet   Oral   Take 400 mg by mouth every 6 (six) hours as needed. For pain.         Marland Kitchen lidocaine-hydrocortisone (ANAMANTEL HC) 3-0.5 % CREA   Rectal   Place  1 Applicatorful rectally 2 (two) times daily.   7 g   0   . magnesium hydroxide (MILK OF MAGNESIA) 400 MG/5ML suspension   Oral   Take 15 mLs by mouth daily as needed. For constipation.         . nitroGLYCERIN (NITROGLYN) 2 % ointment   Transdermal   Place 1 inch onto the skin every 6 (six) hours.   30 g   2     Apply to anal region   . olmesartan-hydrochlorothiazide (BENICAR HCT) 20-12.5 MG per tablet   Oral   Take 1 tablet by mouth daily.   30 tablet   0   . phenylephrine-shark liver oil-mineral oil-petrolatum (PREPARATION H) 0.25-3-14-71.9 % rectal ointment   Rectal   Place 1 application rectally 2 (two) times daily as needed. For hemorrhoids         . Saxagliptin-Metformin (KOMBIGLYZE XR) 12-998 MG TB24   Oral   Take 1 tablet by mouth daily.   30 tablet   0   . shark liver oil-cocoa butter (PREPARATION H) 0.25-3-85.5 % suppository   Rectal   Place 1 suppository rectally daily as needed. For hemorrhoids         . traMADol (ULTRAM) 50 MG tablet   Oral   Take 50 mg by mouth every 6 (six) hours as needed. For pain         . Witch Hazel (PREPARATION H  EX)   Apply externally   Apply 1 application topically every 6 (six) hours as needed. For hemorrhoids           BP 117/85  Pulse 120  Temp(Src) 98.2 F (36.8 C) (Oral)  Resp 18  SpO2 96%  Physical Exam  Nursing note and vitals reviewed. Constitutional: He appears well-developed and well-nourished. No distress.  HENT:  Head: Normocephalic and atraumatic.  Right Ear: External ear normal.  Left Ear: External ear normal.  Eyes: Conjunctivae are normal. Right eye exhibits no discharge. Left eye exhibits no discharge. No scleral icterus.  Neck: Neck supple. No tracheal deviation present.  Cardiovascular: Normal rate, regular rhythm and intact distal pulses.   Pulmonary/Chest: Effort normal and breath sounds normal. No stridor. No respiratory distress. He has no wheezes. He has no rales.  Abdominal: Soft. Bowel sounds are normal. He exhibits no distension. There is no tenderness. There is no rebound and no guarding.  Musculoskeletal: He exhibits no edema and no tenderness.  Weak pulses left dorsalis pedis and posterior tibial, foot is warm without cyanosis  Neurological: He is alert. He has normal strength. No sensory deficit. Cranial nerve deficit:  no gross defecits noted. He exhibits normal muscle tone. He displays no seizure activity. Coordination normal.  Skin: Skin is warm and dry. No rash noted.  Psychiatric: He has a normal mood and affect.    ED Course  Procedures (including critical care time)  Labs Reviewed - No data to display No results found.   1. Peripheral vascular disease       MDM  The patient has very weak pulses however we couldn't obtain a Doppler pulse. He does not have any evidence in the emergency department of acute cyanosis or ischemia. Patient denies any pain at this time.  He certainly has peripheral vascular disease but at this time there doesn't appear to be any need for acute vascular intervention. I spoke with the vascular surgeon on call, Dr.  early. Office will call him tomorrow to schedule an appointment.  Celene Kras, MD 10/05/12 [phone]

## 2012-10-05 NOTE — ED Notes (Signed)
Patient has a history of DM Chronic cough Leg pain

## 2012-10-06 ENCOUNTER — Telehealth: Payer: Self-pay | Admitting: Vascular Surgery

## 2012-10-06 ENCOUNTER — Other Ambulatory Visit: Payer: Self-pay | Admitting: *Deleted

## 2012-10-06 DIAGNOSIS — I998 Other disorder of circulatory system: Secondary | ICD-10-CM

## 2012-10-06 DIAGNOSIS — I739 Peripheral vascular disease, unspecified: Secondary | ICD-10-CM

## 2012-10-06 NOTE — Telephone Encounter (Addendum)
Message copied by Shari Prows on Tue Oct 06, 2012  1:35 PM ------      Message from: Melene Plan      Created: Tue Oct 06, 2012 11:12 AM       Dr Ilean Skill was consulted on this pt from ER last night for claudication.Had prior work in [place] possible iliac stenting. He needs LE arterials;ABI and a duplex to check iliacs. Needs to be sooner  With anyone or at least next Tuesday with Dr Ilean Skill. ------  I called the above pt regarding scheduling an appointment. I spoke w/ both the patient and his wife and they declined an appt for tomorrow 10/07/12 stating there was a conflict w/ another appt. I scheduled them to come in and see [facility] on 12/18/12 and will notify [facility] of this as well. awt

## 2012-10-06 NOTE — Telephone Encounter (Signed)
I called the pt's wife back per [facility] instruction. Pt cannot wait to be seen until 12/2012 due to ischemic toe so we rescheduled the appt to 10/07/12 w/ [facility]. They are aware of this appt/awt

## 2012-10-07 ENCOUNTER — Ambulatory Visit (INDEPENDENT_AMBULATORY_CARE_PROVIDER_SITE_OTHER): Payer: Self-pay | Admitting: Vascular Surgery

## 2012-10-07 ENCOUNTER — Encounter: Payer: Self-pay | Admitting: Vascular Surgery

## 2012-10-07 ENCOUNTER — Encounter: Payer: Self-pay | Admitting: *Deleted

## 2012-10-07 VITALS — BP 101/62 | HR 101 | Ht 72.0 in | Wt 224.8 lb

## 2012-10-07 DIAGNOSIS — I70209 Unspecified atherosclerosis of native arteries of extremities, unspecified extremity: Secondary | ICD-10-CM | POA: Insufficient documentation

## 2012-10-07 DIAGNOSIS — I70219 Atherosclerosis of native arteries of extremities with intermittent claudication, unspecified extremity: Secondary | ICD-10-CM

## 2012-10-07 NOTE — Assessment & Plan Note (Signed)
This patient has evidence of iliac artery occlusive disease on the left and likely infrainguinal arterial occlusive disease. He has claudication and is now developed early rest pain. We considered proceeding with an arteriogram however he does not want to proceed at this time and would prefer to be seen back in 3 months. He is apparently trying to work out some issues with his insurance. I think that he is likely develop recurrent disease were he said previous iliac stents on the left in [place]. I've explained that certainly if his symptoms worsen before his follow up visit in 3 months to let us know and we'll be happy to see him sooner. He had had a long discussion about the importance of tobacco cessation. Also discussed the importance of getting I structured walking program.

## 2012-10-07 NOTE — Progress Notes (Signed)
[facility]ist of [facility]  Patient name: Alejandro Jones MRN: [id number] DOB: [birth year] Sex: male  REASON FOR CONSULT: peripheral vascular disease. Referred by the emergency department.  HPI: Alejandro Jones is a 57 y.o. male who had been seen in urgent care with discoloration of his left great toe. He was seen in the emergency department. They referred him for vascular consultation. He states that he has had a long history of claudication in both calves but this is more significant on the left side. He experiences pain in his calves brought on by ambulation and relieved with rest. This occurs approximately 2 sig blocks. He also has developed some rest pain on the left side. His has been gradual in onset. He is unaware of any history of ulcers. He did note is some slight discoloration of his left great toe he was seen in the emergency department prior. He does not remember any sudden change in his symptoms.  He does have diabetes hypertension hypercholesterolemia although he is currently not on a statin. He denies any previous history of myocardial infarction or history of congestive heart failure. He denies any history of chest pain. He does state that he had a stress test last year which was reportedly unremarkable.  Past Medical History  Diagnosis Date  . Hypertension   . Diabetes mellitus   . Acid reflux   . Asthma   . Constipation   . Myocardial infarction   . Peripheral vascular disease     Family History  Problem Relation Age of Onset  . Diabetes Mother   . Diabetes Sister   . Cancer Brother   . Diabetes Brother   He is unaware of any family history of premature cardiovascular disease.  SOCIAL HISTORY: History  Substance Use Topics  . Smoking status: Current Every Day Smoker -- 1.00 packs/day for 45 years    Types: Cigarettes  . Smokeless tobacco: Never Used  . Alcohol Use: No    Allergies  Allergen Reactions  . Latex Itching    Current Outpatient  Prescriptions  Medication Sig Dispense Refill  . ALPRAZolam (XANAX) 0.5 MG tablet Take 0.5 mg by mouth at bedtime as needed. For sleep      . aspirin EC 81 MG tablet Take 81 mg by mouth daily.        . beclomethasone (QVAR) 80 MCG/ACT inhaler Inhale 1 puff into the lungs 2 (two) times daily.      Marland Kitchen docusate sodium (COLACE) 100 MG capsule Take 1 capsule (100 mg total) by mouth every 12 (twelve) hours.  60 capsule  0  . esomeprazole (NEXIUM) 40 MG capsule Take 1 capsule (40 mg total) by mouth daily before breakfast.  30 capsule  2  . glipiZIDE (GLUCOTROL XL) 5 MG 24 hr tablet Take 2 tablets (10 mg total) by mouth daily.  30 tablet  0  . hydrocortisone (ANUSOL-HC) 2.5 % rectal cream Place 1 application rectally 2 (two) times daily.  30 g  3  . hydrOXYzine (ATARAX/VISTARIL) 25 MG tablet Take 25 mg by mouth 3 (three) times daily as needed. For itching      . lidocaine-hydrocortisone (ANAMANTEL HC) 3-0.5 % CREA Place 1 Applicatorful rectally 2 (two) times daily.  7 g  0  . olmesartan-hydrochlorothiazide (BENICAR HCT) 20-12.5 MG per tablet Take 1 tablet by mouth daily.  30 tablet  0  . Saxagliptin-Metformin (KOMBIGLYZE XR) 12-998 MG TB24 Take 1 tablet by mouth daily.  30 tablet  0  . traMADol (  ULTRAM) 50 MG tablet Take 50 mg by mouth every 6 (six) hours as needed. For pain      . albuterol (PROVENTIL HFA;VENTOLIN HFA) 108 (90 BASE) MCG/ACT inhaler Inhale 2 puffs into the lungs every 6 (six) hours as needed. For shortness of breath.      Marland Kitchen ibuprofen (ADVIL,MOTRIN) 200 MG tablet Take 400 mg by mouth every 6 (six) hours as needed. For pain.      . magnesium hydroxide (MILK OF MAGNESIA) 400 MG/5ML suspension Take 15 mLs by mouth daily as needed. For constipation.      . nitroGLYCERIN (NITROGLYN) 2 % ointment Place 1 inch onto the skin every 6 (six) hours.  30 g  2  . phenylephrine-shark liver oil-mineral oil-petrolatum (PREPARATION H) 0.25-3-14-71.9 % rectal ointment Place 1 application rectally 2 (two) times  daily as needed. For hemorrhoids      . Witch Hazel (PREPARATION H EX) Apply 1 application topically every 6 (six) hours as needed. For hemorrhoids       No current facility-administered medications for this visit.    REVIEW OF SYSTEMS: [name] ] denotes positive finding; [  ] denotes negative finding  CARDIOVASCULAR:  [ ]  chest pain   [ ]  chest pressure   [ ]  palpitations   [ ]  orthopnea   [name] ] dyspnea on exertion   [name] ] claudication   [name] ] rest pain   [ ]  DVT   [ ]  phlebitis PULMONARY:   [name] ] productive cough   [name] ] asthma   [name] ] wheezing NEUROLOGIC:   [ ]  weakness  [ ]  paresthesias  [ ]  aphasia  [ ]  amaurosis  [name] ] dizziness HEMATOLOGIC:   [ ]  bleeding problems   [ ]  clotting disorders MUSCULOSKELETAL:  [ ]  joint pain   [ ]  joint swelling [ ]  leg swelling GASTROINTESTINAL: [ ]   blood in stool  [ ]   hematemesis GENITOURINARY:  [ ]   dysuria  [ ]   hematuria PSYCHIATRIC:  [ ]  history of major depression INTEGUMENTARY:  [name] ] rashes  [ ]  ulcers CONSTITUTIONAL:  [ ]  fever   [ ]  chills  PHYSICAL EXAM: Filed Vitals:   10/07/12 1430  BP: 101/62  Pulse: 101  Height: 6' (1.829 m)  Weight: 224 lb 12.8 oz (101.969 kg)  SpO2: 98%   Body mass index is 30.48 kg/(m^2). GENERAL: The patient is a well-nourished male, in no acute distress. The vital signs are documented above. CARDIOVASCULAR: There is a regular rate and rhythm. I do not detect carotid bruits. He has a palpable right femoral pulse. I cannot palpate a left femoral pulse. I cannot palpate popliteal or pedal pulses. PULMONARY: There is good air exchange bilaterally without wheezing or rales. ABDOMEN: Soft and non-tender with normal pitched bowel sounds.  MUSCULOSKELETAL: There are no major deformities or cyanosis. NEUROLOGIC: No focal weakness or paresthesias are detected. SKIN: He has some slight bluish discoloration of his left great toe. There are no ulcers. PSYCHIATRIC: The patient has a normal affect.  DATA:  I did interrogate with  the Doppler in the office. He has a monophasic left dorsalis pedis and posterior tibial signal. He has a biphasic posterior tibial signal on the right. I am unable to obtain a dorsalis pedis signal on the right.  MEDICAL ISSUES:  Atherosclerosis of native arteries of the extremities with intermittent claudication This patient has evidence of iliac artery occlusive disease on the left and likely infrainguinal arterial occlusive disease. He has  claudication and is now developed early rest pain. We considered proceeding with an arteriogram however he does not want to proceed at this time and would prefer to be seen back in 3 months. He is apparently trying to work out some issues with his insurance. I think that he is likely develop recurrent disease were he said previous iliac stents on the left in [place]. I've explained that certainly if his symptoms worsen before his follow up visit in 3 months to let us know and we'll be happy to see him sooner. He had had a long discussion about the importance of tobacco cessation. Also discussed the importance of getting I structured walking program.   DICKSON,CHRISTOPHER S [facility]: [phone]

## 2012-10-12 NOTE — Addendum Note (Signed)
Addended by: Sharee Pimple on: 10/12/2012 09:17 AM   Modules accepted: Orders

## 2012-12-08 ENCOUNTER — Encounter: Payer: Self-pay | Admitting: Vascular Surgery

## 2012-12-22 ENCOUNTER — Encounter: Payer: Self-pay | Admitting: Vascular Surgery

## 2012-12-23 ENCOUNTER — Ambulatory Visit (INDEPENDENT_AMBULATORY_CARE_PROVIDER_SITE_OTHER): Payer: No Typology Code available for payment source | Admitting: Vascular Surgery

## 2012-12-23 ENCOUNTER — Encounter: Payer: Self-pay | Admitting: Vascular Surgery

## 2012-12-23 ENCOUNTER — Encounter (INDEPENDENT_AMBULATORY_CARE_PROVIDER_SITE_OTHER): Payer: No Typology Code available for payment source | Admitting: Vascular Surgery

## 2012-12-23 VITALS — BP 108/75 | HR 113 | Ht 72.0 in | Wt 219.0 lb

## 2012-12-23 DIAGNOSIS — I70219 Atherosclerosis of native arteries of extremities with intermittent claudication, unspecified extremity: Secondary | ICD-10-CM

## 2012-12-23 DIAGNOSIS — I998 Other disorder of circulatory system: Secondary | ICD-10-CM | POA: Insufficient documentation

## 2012-12-23 DIAGNOSIS — I999 Unspecified disorder of circulatory system: Secondary | ICD-10-CM

## 2012-12-23 NOTE — Progress Notes (Signed)
Vascular and Vein Specialist of [facility]  Patient name: Alejandro Jones MRN: [id number] DOB: 01-31-1956 Sex: male  REASON FOR VISIT: Follow up peripheral vascular disease.  HPI: Alejandro Jones is a 57 y.o. male who I seen in consultation with discoloration of his left great toe. He had been seen by the emergency department and then was set up to see me in the office. I saw him he had evidence of chronic peripheral vascular disease with long-standing claudication of both calves. He had also developed some rest pain in the left foot. He did not remember any sudden change in symptoms related to the discoloration of his toe.  We've discussed proceeding with arteriography, however he was reluctant to proceed in wish to see back in 3 weeks. He comes in for follow up visit.  Since that visit, his symptoms have been stable. He does have bilateral lower extremity claudication. He also has some rest pain in the left foot. He has no nonhealing ulcers. The discoloration of the left great toe has improved   REVIEW OF SYSTEMS: [name] ] denotes positive finding; [  ] denotes negative finding  CARDIOVASCULAR:  [ ]  chest pain   [ ]  dyspnea on exertion    CONSTITUTIONAL:  [ ]  fever   [ ]  chills  PHYSICAL EXAM: Filed Vitals:   12/23/12 1559  BP: 108/75  Pulse: 113  Height: 6' (1.829 m)  Weight: 219 lb (99.338 kg)  SpO2: 100%   Body mass index is 29.7 kg/(m^2). GENERAL: The patient is a well-nourished male, in no acute distress. The vital signs are documented above. CARDIOVASCULAR: There is a regular rate and rhythm. He has a palpable right femoral pulse. I cannot palpate a femoral pulse on the left. I cannot palpate pedal pulses. He has chronic cyanosis of both lower extremities. PULMONARY: There is good air exchange bilaterally without wheezing or rales.  He did have an ultrasound of both lower extremities [place] in [place]. That study was reviewed and showed that he had monophasic flow in the left  common femoral superficial femoral and popliteal vessels in addition to the posterior tibial arteries. The left dorsalis pedis could not be obtained. He was felt to have inflow disease on the left also.  I have independently interpreted the Doppler study in our office today which shows triphasic Doppler signals in the right posterior tibial and dorsalis pedis arteries with an ABI of 100%. On the left side he has monophasic signals with an ABI of 69% and a TBI of 43% area his toe pressure on the left is 52 mmHg.  MEDICAL ISSUES: We have again discussed the option of proceeding with arteriography however again he is reluctant to proceed I asked him to simply call to schedule this but he would prefer to be seen back in one month to see how he is doing. I do not think this is unreasonable. I'll see him back in one month. We have again discussed the importance of tobacco cessation. He knows to call sooner if he has problems.  Dwayn Moravek S [facility] [place] [facility]: [phone]

## 2013-01-13 ENCOUNTER — Ambulatory Visit: Payer: Self-pay | Admitting: Vascular Surgery

## 2013-01-26 ENCOUNTER — Encounter: Payer: Self-pay | Admitting: Vascular Surgery

## 2013-01-27 ENCOUNTER — Encounter: Payer: Self-pay | Admitting: Vascular Surgery

## 2013-01-27 ENCOUNTER — Other Ambulatory Visit: Payer: Self-pay | Admitting: *Deleted

## 2013-01-27 ENCOUNTER — Ambulatory Visit (INDEPENDENT_AMBULATORY_CARE_PROVIDER_SITE_OTHER): Payer: Worker's Compensation | Admitting: Vascular Surgery

## 2013-01-27 VITALS — BP 91/63 | HR 102 | Resp 18 | Ht 72.0 in | Wt 218.0 lb

## 2013-01-27 DIAGNOSIS — I739 Peripheral vascular disease, unspecified: Secondary | ICD-10-CM

## 2013-01-27 DIAGNOSIS — I70219 Atherosclerosis of native arteries of extremities with intermittent claudication, unspecified extremity: Secondary | ICD-10-CM

## 2013-01-27 DIAGNOSIS — M79609 Pain in unspecified limb: Secondary | ICD-10-CM

## 2013-01-27 NOTE — Progress Notes (Signed)
Vascular and Vein Specialist of [facility]  Patient name: Alejandro Jones MRN: [id number] DOB: 07/02/1956 Sex: male  REASON FOR VISIT: Follow up of left lower extremity claudication.  HPI: Alejandro Jones is a 57 y.o. male who I had seen in consultation with discoloration of his left great toe. He had been seen in the emergency department and set up for me to see in the office. He has evidence of chronic peripheral vascular disease and a long history of claudication. We have previously discussed considering arteriography however at that time he was reluctant to proceed. He comes back for a follow up visit.  He continues to have claudication in the left calf which is brought on by ambulation and relieved with rest. He describes some rest pain in the left foot although he also describes similar symptoms on the right and on the right he has a normal ABI with triphasic Doppler signals. He denies any history of nonhealing wounds.  He states he has several other issues. He's been told that he needs to have a procedure done on his eyes and if he does not proceed he could potentially have vision problems I do not have the details of this. In addition he apparently requires a colonoscopy because of his history of polyps. In addition he said some right shoulder pain to  Past Medical History  Diagnosis Date  . Hypertension   . Diabetes mellitus   . Acid reflux   . Asthma   . Constipation   . Myocardial infarction   . Peripheral vascular disease    Family History  Problem Relation Age of Onset  . Diabetes Mother   . Diabetes Sister   . Cancer Brother   . Diabetes Brother    SOCIAL HISTORY: History  Substance Use Topics  . Smoking status: Current Every Day Smoker -- 1.00 packs/day for 45 years    Types: Cigarettes  . Smokeless tobacco: Never Used  . Alcohol Use: No   Allergies  Allergen Reactions  . Latex Itching   Current Outpatient Prescriptions  Medication Sig Dispense Refill  .  albuterol (PROVENTIL HFA;VENTOLIN HFA) 108 (90 BASE) MCG/ACT inhaler Inhale 2 puffs into the lungs every 6 (six) hours as needed. For shortness of breath.      . ALPRAZolam (XANAX) 0.5 MG tablet Take 0.5 mg by mouth at bedtime as needed. For sleep      . aspirin EC 81 MG tablet Take 81 mg by mouth daily.        Marland Kitchen atorvastatin (LIPITOR) 20 MG tablet daily.      . beclomethasone (QVAR) 80 MCG/ACT inhaler Inhale 1 puff into the lungs 2 (two) times daily.      Marland Kitchen docusate sodium (COLACE) 100 MG capsule Take 1 capsule (100 mg total) by mouth every 12 (twelve) hours.  60 capsule  0  . esomeprazole (NEXIUM) 40 MG capsule Take 1 capsule (40 mg total) by mouth daily before breakfast.  30 capsule  2  . FREESTYLE LITE test strip       . glipiZIDE (GLUCOTROL XL) 5 MG 24 hr tablet Take 2 tablets (10 mg total) by mouth daily.  30 tablet  0  . hydrocortisone (ANUSOL-HC) 2.5 % rectal cream Place 1 application rectally 2 (two) times daily.  30 g  3  . hydrOXYzine (ATARAX/VISTARIL) 25 MG tablet Take 25 mg by mouth 3 (three) times daily as needed. For itching      . ibuprofen (ADVIL,MOTRIN) 200 MG tablet Take 400 mg  by mouth every 6 (six) hours as needed. For pain.      . Lancets (FREESTYLE) lancets       . lidocaine (XYLOCAINE) 5 % ointment as needed.      . lidocaine-hydrocortisone (ANAMANTEL HC) 3-0.5 % CREA Place 1 Applicatorful rectally 2 (two) times daily.  7 g  0  . magnesium hydroxide (MILK OF MAGNESIA) 400 MG/5ML suspension Take 15 mLs by mouth daily as needed. For constipation.      . nitroGLYCERIN (NITROGLYN) 2 % ointment Place 1 inch onto the skin every 6 (six) hours.  30 g  2  . olmesartan-hydrochlorothiazide (BENICAR HCT) 20-12.5 MG per tablet Take 1 tablet by mouth daily.  30 tablet  0  . phenylephrine-shark liver oil-mineral oil-petrolatum (PREPARATION H) 0.25-3-14-71.9 % rectal ointment Place 1 application rectally 2 (two) times daily as needed. For hemorrhoids      . Saxagliptin-Metformin  (KOMBIGLYZE XR) 12-998 MG TB24 Take 1 tablet by mouth daily.  30 tablet  0  . traMADol (ULTRAM) 50 MG tablet Take 50 mg by mouth every 6 (six) hours as needed. For pain      . Witch Hazel (PREPARATION H EX) Apply 1 application topically every 6 (six) hours as needed. For hemorrhoids      . pantoprazole (PROTONIX) 40 MG tablet        No current facility-administered medications for this visit.   REVIEW OF SYSTEMS: [name] ] denotes positive finding; [  ] denotes negative finding  CARDIOVASCULAR:  [ ]  chest pain   [ ]  chest pressure   [ ]  palpitations   [ ]  orthopnea   [ ]  dyspnea on exertion   [name] ] claudication   [name] ] rest pain ?  [ ]  DVT   [ ]  phlebitis PULMONARY:   [ ]  productive cough   [ ]  asthma   [ ]  wheezing NEUROLOGIC:   [ ]  weakness  [ ]  paresthesias  [ ]  aphasia  [ ]  amaurosis  [ ]  dizziness HEMATOLOGIC:   [ ]  bleeding problems   [ ]  clotting disorders MUSCULOSKELETAL:  [ ]  joint pain   [ ]  joint swelling [ ]  leg swelling GASTROINTESTINAL: [ ]   blood in stool  [ ]   hematemesis GENITOURINARY:  [ ]   dysuria  [ ]   hematuria PSYCHIATRIC:  [ ]  history of major depression INTEGUMENTARY:  [ ]  rashes  [ ]  ulcers CONSTITUTIONAL:  [ ]  fever   [ ]  chills  PHYSICAL EXAM: Filed Vitals:   01/27/13 1309  BP: 91/63  Pulse: 102  Resp: 18  Height: 6' (1.829 m)  Weight: 218 lb (98.884 kg)  SpO2: 98%   Body mass index is 29.56 kg/(m^2). GENERAL: The patient is a well-nourished male, in no acute distress. The vital signs are documented above. CARDIOVASCULAR: There is a regular rate and rhythm. He has a palpable right femoral pulse. I cannot palpate a left femoral pulse. I cannot palpate pedal pulses on the left. PULMONARY: There is good air exchange bilaterally without wheezing or rales. ABDOMEN: Soft and non-tender with normal pitched bowel sounds.  MUSCULOSKELETAL: There are no major deformities or cyanosis. NEUROLOGIC: No focal weakness or paresthesias are detected. SKIN: There are no ulcers  or rashes noted. PSYCHIATRIC: The patient has a normal affect.  DATA:  I reviewed his Doppler study from 12/23/2012 which shows an ABI of her percent on the right with triphasic signals in the dorsalis pedis and posterior tibial positions. He has an ABI  of 69% on the left monophasic signals in the left foot.  MEDICAL ISSUES: Given that his claudication symptoms are stable, I think we can continue to follow this for now. We have again discussed the importance of tobacco cessation and he is smoking one pack per day currently. He is on aspirin and also on Lipitor. I've explained to him that I would recommend proceeding with the procedure on his eye it is felt that is indicated and once this issue is resolved he should proceed with his colonoscopy given his history of polyps. I'll plan on seeing him back in 2 months to follow his claudication symptoms. If his symptoms progress adequately to proceed with arteriography. If he does require angioplasty and stenting of an iliac stenosis on the left he would need to be on Plavix after the procedure which is all the more recent to try to get as I taking care of and also his colonoscopy done. He knows to call sooner if he has problems.  General Wearing S [facility] [place] [facility]: [phone]

## 2013-03-23 ENCOUNTER — Telehealth: Payer: Self-pay | Admitting: Vascular Surgery

## 2013-03-23 NOTE — Telephone Encounter (Signed)
Message copied by Jena Gauss on Tue Mar 23, 2013  2:35 PM ------      Message from: Melene Plan      Created: Mon Mar 22, 2013  5:08 PM       This pt's wife called and wants to set up a dye study. According to Dr CSD's note on 01/27/13 he is suppose to be seen in 2 months; which is this month. Looking at the schedule he is not scheduled til Jan 2015. Can you call & give him an office visit with Dr CSD ASAP; no study?jjk ------

## 2013-03-31 ENCOUNTER — Encounter: Payer: Self-pay | Admitting: Vascular Surgery

## 2013-03-31 ENCOUNTER — Ambulatory Visit (INDEPENDENT_AMBULATORY_CARE_PROVIDER_SITE_OTHER): Payer: No Typology Code available for payment source | Admitting: Vascular Surgery

## 2013-03-31 VITALS — BP 122/78 | HR 88 | Resp 20 | Ht 72.0 in | Wt 219.0 lb

## 2013-03-31 DIAGNOSIS — I739 Peripheral vascular disease, unspecified: Secondary | ICD-10-CM | POA: Insufficient documentation

## 2013-03-31 NOTE — Progress Notes (Signed)
Vascular and Vein Specialist of [facility]  Patient name: Alejandro Jones MRN: [id number] DOB: 13-Feb-1956 Sex: male  REASON FOR VISIT: Follow up of peripheral vascular disease.  [name] is a 57 y.o. male who I last saw on 01/27/2013. An arterial Doppler study on 12/23/2012 showed triphasic Doppler signals in the right foot. On the left side had monophasic signals with ABI of 69%. Given that his claudication symptoms were stable we were following this for now. Discussed the importance of tobacco cessation. We felt that if the symptoms progress we could consider arteriography.  The patient continues to have claudication in both lower extremities more significantly on the left side. He states this occurs at a short distance. The pain involves his calf and is brought on by ambulation and relieved with rest. He denies significant thigh are hip claudication. He states that he has had a previous iliac stent in [place] but cannot remember which side. He also notes some early rest pain in the left foot and paresthesias in the left foot.  He is scheduled to have a colonoscopy in mid September believe because of a history of polyps.  Past Medical History  Diagnosis Date  . Hypertension   . Diabetes mellitus   . Acid reflux   . Asthma   . Constipation   . Myocardial infarction   . Peripheral vascular disease     Family History  Problem Relation Age of Onset  . Diabetes Mother   . Diabetes Sister   . Cancer Brother   . Diabetes Brother     SOCIAL HISTORY: History  Substance Use Topics  . Smoking status: Current Every Day Smoker -- 1.00 packs/day for 45 years    Types: Cigarettes  . Smokeless tobacco: Never Used  . Alcohol Use: No    Allergies  Allergen Reactions  . Latex Itching    Current Outpatient Prescriptions  Medication Sig Dispense Refill  . albuterol (PROVENTIL HFA;VENTOLIN HFA) 108 (90 BASE) MCG/ACT inhaler Inhale 2 puffs into the lungs every 6  (six) hours as needed. For shortness of breath.      . ALPRAZolam (XANAX) 0.5 MG tablet Take 0.5 mg by mouth at bedtime as needed. For sleep      . aspirin EC 81 MG tablet Take 81 mg by mouth daily.        Marland Kitchen atorvastatin (LIPITOR) 20 MG tablet daily.      . beclomethasone (QVAR) 80 MCG/ACT inhaler Inhale 1 puff into the lungs 2 (two) times daily.      Marland Kitchen docusate sodium (COLACE) 100 MG capsule Take 1 capsule (100 mg total) by mouth every 12 (twelve) hours.  60 capsule  0  . esomeprazole (NEXIUM) 40 MG capsule Take 1 capsule (40 mg total) by mouth daily before breakfast.  30 capsule  2  . FREESTYLE LITE test strip       . glipiZIDE (GLUCOTROL XL) 5 MG 24 hr tablet Take 2 tablets (10 mg total) by mouth daily.  30 tablet  0  . hydrocortisone (ANUSOL-HC) 2.5 % rectal cream Place 1 application rectally 2 (two) times daily.  30 g  3  . hydrOXYzine (ATARAX/VISTARIL) 25 MG tablet Take 25 mg by mouth 3 (three) times daily as needed. For itching      . ibuprofen (ADVIL,MOTRIN) 200 MG tablet Take 400 mg by mouth every 6 (six) hours as needed. For pain.      . Lancets (FREESTYLE) lancets       .  lidocaine (XYLOCAINE) 5 % ointment as needed.      . lidocaine-hydrocortisone (ANAMANTEL HC) 3-0.5 % CREA Place 1 Applicatorful rectally 2 (two) times daily.  7 g  0  . magnesium hydroxide (MILK OF MAGNESIA) 400 MG/5ML suspension Take 15 mLs by mouth daily as needed. For constipation.      . nitroGLYCERIN (NITROGLYN) 2 % ointment Place 1 inch onto the skin every 6 (six) hours.  30 g  2  . olmesartan-hydrochlorothiazide (BENICAR HCT) 20-12.5 MG per tablet Take 1 tablet by mouth daily.  30 tablet  0  . pantoprazole (PROTONIX) 40 MG tablet       . phenylephrine-shark liver oil-mineral oil-petrolatum (PREPARATION H) 0.25-3-14-71.9 % rectal ointment Place 1 application rectally 2 (two) times daily as needed. For hemorrhoids      . Saxagliptin-Metformin (KOMBIGLYZE XR) 12-998 MG TB24 Take 1 tablet by mouth daily.  30 tablet   0  . traMADol (ULTRAM) 50 MG tablet Take 50 mg by mouth every 6 (six) hours as needed. For pain      . Witch Hazel (PREPARATION H EX) Apply 1 application topically every 6 (six) hours as needed. For hemorrhoids       No current facility-administered medications for this visit.    REVIEW OF SYSTEMS: [name] ] denotes positive finding; [  ] denotes negative finding  CARDIOVASCULAR:  [ ]  chest pain   [ ]  chest pressure   [ ]  palpitations   [name] ] orthopnea   [name] ] dyspnea on exertion   [name] ] claudication   [name] ] rest pain   [name] ] DVT   [ ]  phlebitis PULMONARY:   [name] ] productive cough   [ ]  asthma   [ ]  wheezing NEUROLOGIC:   [name] ] weakness  [ ]  paresthesias  [ ]  aphasia  [ ]  amaurosis  [name] ] dizziness HEMATOLOGIC:   [ ]  bleeding problems   [ ]  clotting disorders MUSCULOSKELETAL:  [ ]  joint pain   [ ]  joint swelling [ ]  leg swelling GASTROINTESTINAL: [ ]   blood in stool  [ ]   hematemesis GENITOURINARY:  [ ]   dysuria  [ ]   hematuria PSYCHIATRIC:  [ ]  history of major depression INTEGUMENTARY:  [ ]  rashes  [ ]  ulcers CONSTITUTIONAL:  [ ]  fever   [ ]  chills  PHYSICAL EXAM: Filed Vitals:   03/31/13 1106  BP: 122/78  Pulse: 88  Resp: 20  Height: 6' (1.829 m)  Weight: 219 lb (99.338 kg)   Body mass index is 29.7 kg/(m^2). GENERAL: The patient is a well-nourished male, in no acute distress. The vital signs are documented above. CARDIOVASCULAR: There is a regular rate and rhythm. I do not detect carotid bruits. He has a palpable right femoral pulse. I cannot palpate a left femoral pulse. I cannot palpate pedal pulses. I my Doppler exam he has monophasic Doppler signals in the left dorsalis pedis and posterior tibial positions with a biphasic right posterior tibial signal and a monophasic right anterior tibial signal. PULMONARY: There is good air exchange bilaterally without wheezing or rales. ABDOMEN: Soft and non-tender with normal pitched bowel sounds.  MUSCULOSKELETAL: There are no major deformities or  cyanosis. NEUROLOGIC: No focal weakness or paresthesias are detected. SKIN: There are no ulcers or rashes noted. PSYCHIATRIC: The patient has a normal affect.  DATA:  He did bring a MRI of the neck which was done by [facility]. 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  MEDICAL ISSUES:  PERIPHERAL VASCULAR DISEASE: The patient continues to smoke a pack per day of cigarettes. We have had a long discussion about the importance of tobacco cessation.I have discussed with the patient the nature of atherosclerosis, and emphasized the importance of maximal medical management including control of blood pressure, blood glucose, and cholesterol levels, antiplatelet agents, obtaining regular exercise, and cessation of smoking. The patient is aware that without maximal medical management the underlying atherosclerotic disease process will progress and also limit the benefit of any interventions. Given that his symptoms have progressed he would like to consider arteriography. However he is scheduled for a colonoscopy in September and given that we would might have to put him on Plavix after the procedure if we place a stent we would prefer to do this in October after his colonoscopy. I have reviewed with the patient the indications for arteriography. In addition, I have reviewed the potential complications of arteriography including but not limited to: Bleeding, arterial injury, arterial thrombosis, dye action, renal insufficiency, or other unpredictable medical problems. I have explained to the patient that if we find disease amenable to angioplasty we could potentially address this at the same time. I have discussed the potential complications of angioplasty and stenting, including but not limited to: Bleeding, arterial thrombosis,  arterial injury, dissection, or the need for surgical intervention. We will make further recommendations pending results of his arteriogram. Of note he is on aspirin and he is on Lipitor.  Domonique Cothran S [facility] [place] [facility]: [phone]

## 2013-04-12 ENCOUNTER — Other Ambulatory Visit: Payer: Self-pay

## 2013-05-04 ENCOUNTER — Encounter (HOSPITAL_COMMUNITY): Payer: Self-pay | Admitting: Pharmacy Technician

## 2013-05-09 MED ORDER — SODIUM CHLORIDE 0.9 % IV SOLN
INTRAVENOUS | Status: DC
  Administered 2013-05-10: 09:00:00 via INTRAVENOUS

## 2013-05-10 ENCOUNTER — Ambulatory Visit (HOSPITAL_COMMUNITY)
Admission: RE | Admit: 2013-05-10 | Discharge: 2013-05-10 | Disposition: A | Discharge status: Home / Self Care | Payer: No Typology Code available for payment source | Source: Ambulatory Visit | Attending: Vascular Surgery | Admitting: Vascular Surgery

## 2013-05-10 ENCOUNTER — Encounter (HOSPITAL_COMMUNITY): Payer: Self-pay | Admitting: Pharmacy Technician

## 2013-05-10 ENCOUNTER — Other Ambulatory Visit: Payer: Self-pay | Admitting: *Deleted

## 2013-05-10 ENCOUNTER — Encounter (HOSPITAL_COMMUNITY)
Admission: RE | Disposition: A | Discharge status: Home / Self Care | Payer: Self-pay | Source: Ambulatory Visit | Attending: Vascular Surgery

## 2013-05-10 ENCOUNTER — Telehealth: Payer: Self-pay | Admitting: Vascular Surgery

## 2013-05-10 DIAGNOSIS — I739 Peripheral vascular disease, unspecified: Secondary | ICD-10-CM

## 2013-05-10 DIAGNOSIS — I745 Embolism and thrombosis of iliac artery: Secondary | ICD-10-CM | POA: Insufficient documentation

## 2013-05-10 DIAGNOSIS — I70219 Atherosclerosis of native arteries of extremities with intermittent claudication, unspecified extremity: Secondary | ICD-10-CM

## 2013-05-10 HISTORY — PX: LOWER EXTREMITY ANGIOGRAM: SHX5508

## 2013-05-10 HISTORY — PX: ABDOMINAL AORTAGRAM: SHX5454

## 2013-05-10 LAB — POCT I-STAT, CHEM 8
BUN: 12 mg/dL (ref 6–23)
Calcium, Ion: 1.26 mmol/L — ABNORMAL HIGH (ref 1.12–1.23)
Chloride: 98 mEq/L (ref 96–112)

## 2013-05-10 LAB — GLUCOSE, CAPILLARY: Glucose-Capillary: 199 mg/dL — ABNORMAL HIGH (ref 70–99)

## 2013-05-10 SURGERY — ABDOMINAL AORTAGRAM
Anesthesia: LOCAL

## 2013-05-10 MED ORDER — HEPARIN (PORCINE) IN NACL 2-0.9 UNIT/ML-% IJ SOLN
INTRAMUSCULAR | Status: AC
  Filled 2013-05-10: qty 1000

## 2013-05-10 MED ORDER — OXYCODONE-ACETAMINOPHEN 5-325 MG PO TABS
1.0000 | ORAL_TABLET | ORAL | Status: DC | PRN
  Administered 2013-05-10: 2 via ORAL

## 2013-05-10 MED ORDER — SODIUM CHLORIDE 0.9 % IV SOLN: 1.0000 mL/kg/h | INTRAVENOUS | Status: DC

## 2013-05-10 MED ORDER — MIDAZOLAM HCL 2 MG/2ML IJ SOLN
INTRAMUSCULAR | Status: AC
  Filled 2013-05-10: qty 2

## 2013-05-10 MED ORDER — FENTANYL CITRATE 0.05 MG/ML IJ SOLN
INTRAMUSCULAR | Status: AC
  Filled 2013-05-10: qty 2

## 2013-05-10 MED ORDER — ONDANSETRON HCL 4 MG/2ML IJ SOLN: 4.0000 mg | Freq: Four times a day (QID) | INTRAMUSCULAR | Status: DC | PRN

## 2013-05-10 MED ORDER — OXYCODONE-ACETAMINOPHEN 5-325 MG PO TABS
ORAL_TABLET | ORAL | Status: AC
  Filled 2013-05-10: qty 2

## 2013-05-10 MED ORDER — LIDOCAINE HCL (PF) 1 % IJ SOLN
INTRAMUSCULAR | Status: AC
  Filled 2013-05-10: qty 30

## 2013-05-10 MED ORDER — ACETAMINOPHEN 325 MG PO TABS: 650.0000 mg | ORAL_TABLET | ORAL | Status: DC | PRN

## 2013-05-10 NOTE — Telephone Encounter (Addendum)
Message copied by Rosalyn Charters on Mon May 10, 2013  4:35 PM ------      Message from: Melene Plan      Created: Mon May 10, 2013 11:25 AM      Regarding: FW: charge                   ----- Message -----         From: Chuck Hint, MD         Sent: 05/10/2013  11:06 AM           To: Reuel Derby, Melene Plan, RN, #      Subject: charge                                                   PROCEDURE:       1. Ultrasound-guided access the right common femoral artery.      2. Aortogram with bilateral iliac arteriogram and bilateral lower extremity runoff            SURGEON: Di Kindle. Edilia Bo, MD, FACS            He will need a follow up visit in 3 months with ABIs. Thank you. CD ------ notified patient's wife of fu appt. on 08-11-13 3:30

## 2013-05-10 NOTE — H&P (Signed)
[facility]ist of [facility]   Patient name: Alejandro Jones MRN: [id number] DOB: 1955/09/15 Sex: male   REASON FOR VISIT: Follow up of peripheral vascular disease.   HPI:  Alejandro Jones is a 57 y.o. male who I had evaluated on 01/27/2013. An arterial Doppler study on 12/23/2012 showed triphasic Doppler signals in the right foot. On the left side had monophasic signals with ABI of 69%. Given that his claudication symptoms were stable we were following this for now. We discussed the importance of tobacco cessation. We felt that if the symptoms progress we could consider arteriography. I saw him on 03/31/13. He was continuing to have claudication in both lower extremities more significantly on the left side. He states this occurs at a short distance. The pain involves his calf and is brought on by ambulation and relieved with rest. He denies significant thigh are hip claudication. He states that he has had a previous iliac stent in [place] but cannot remember which side. He also notes some early rest pain in the left foot and paresthesias in the left foot.   He was scheduled to have a colonoscopy in mid September believe because of a history of polyps.   Past Medical History   Diagnosis  Date   .  Hypertension    .  Diabetes mellitus    .  Acid reflux    .  Asthma    .  Constipation    .  Myocardial infarction    .  Peripheral vascular disease     Family History   Problem  Relation  Age of Onset   .  Diabetes  Mother    .  Diabetes  Sister    .  Cancer  Brother    .  Diabetes  Brother     SOCIAL HISTORY:  History   Substance Use Topics   .  Smoking status:  Current Every Day Smoker -- 1.00 packs/day for 45 years     Types:  Cigarettes   .  Smokeless tobacco:  Never Used   .  Alcohol Use:  No    Allergies   Allergen  Reactions   .  Latex  Itching    Current Outpatient Prescriptions   Medication  Sig  Dispense  Refill   .  albuterol (PROVENTIL  HFA;VENTOLIN HFA) 108 (90 BASE) MCG/ACT inhaler  Inhale 2 puffs into the lungs every 6 (six) hours as needed. For shortness of breath.     .  ALPRAZolam (XANAX) 0.5 MG tablet  Take 0.5 mg by mouth at bedtime as needed. For sleep     .  aspirin EC 81 MG tablet  Take 81 mg by mouth daily.     Marland Kitchen  atorvastatin (LIPITOR) 20 MG tablet  daily.     .  beclomethasone (QVAR) 80 MCG/ACT inhaler  Inhale 1 puff into the lungs 2 (two) times daily.     Marland Kitchen  docusate sodium (COLACE) 100 MG capsule  Take 1 capsule (100 mg total) by mouth every 12 (twelve) hours.  60 capsule  0   .  esomeprazole (NEXIUM) 40 MG capsule  Take 1 capsule (40 mg total) by mouth daily before breakfast.  30 capsule  2   .  FREESTYLE LITE test strip      .  glipiZIDE (GLUCOTROL XL) 5 MG 24 hr tablet  Take 2 tablets (10 mg total) by mouth daily.  30 tablet  0   .  hydrocortisone (  ANUSOL-HC) 2.5 % rectal cream  Place 1 application rectally 2 (two) times daily.  30 g  3   .  hydrOXYzine (ATARAX/VISTARIL) 25 MG tablet  Take 25 mg by mouth 3 (three) times daily as needed. For itching     .  ibuprofen (ADVIL,MOTRIN) 200 MG tablet  Take 400 mg by mouth every 6 (six) hours as needed. For pain.     .  Lancets (FREESTYLE) lancets      .  lidocaine (XYLOCAINE) 5 % ointment  as needed.     .  lidocaine-hydrocortisone (ANAMANTEL HC) 3-0.5 % CREA  Place 1 Applicatorful rectally 2 (two) times daily.  7 g  0   .  magnesium hydroxide (MILK OF MAGNESIA) 400 MG/5ML suspension  Take 15 mLs by mouth daily as needed. For constipation.     .  nitroGLYCERIN (NITROGLYN) 2 % ointment  Place 1 inch onto the skin every 6 (six) hours.  30 g  2   .  olmesartan-hydrochlorothiazide (BENICAR HCT) 20-12.5 MG per tablet  Take 1 tablet by mouth daily.  30 tablet  0   .  pantoprazole (PROTONIX) 40 MG tablet      .  phenylephrine-shark liver oil-mineral oil-petrolatum (PREPARATION H) 0.25-3-14-71.9 % rectal ointment  Place 1 application rectally 2 (two) times daily as needed.  For hemorrhoids     .  Saxagliptin-Metformin (KOMBIGLYZE XR) 12-998 MG TB24  Take 1 tablet by mouth daily.  30 tablet  0   .  traMADol (ULTRAM) 50 MG tablet  Take 50 mg by mouth every 6 (six) hours as needed. For pain     .  Witch Hazel (PREPARATION H EX)  Apply 1 application topically every 6 (six) hours as needed. For hemorrhoids      No current facility-administered medications for this visit.    REVIEW OF SYSTEMS: [name] ] denotes positive finding; [ ]  denotes negative finding  CARDIOVASCULAR: [ ]  chest pain [ ]  chest pressure [ ]  palpitations [name] ] orthopnea  [name] ] dyspnea on exertion [name] ] claudication [name] ] rest pain [name] ] DVT [ ]  phlebitis  PULMONARY: [name] ] productive cough [ ]  asthma [ ]  wheezing  NEUROLOGIC: [name] ] weakness [ ]  paresthesias [ ]  aphasia [ ]  amaurosis [name] ] dizziness  HEMATOLOGIC: [ ]  bleeding problems [ ]  clotting disorders  MUSCULOSKELETAL: [ ]  joint pain [ ]  joint swelling [ ]  leg swelling  GASTROINTESTINAL: [ ]  blood in stool [ ]  hematemesis  GENITOURINARY: [ ]  dysuria [ ]  hematuria  PSYCHIATRIC: [ ]  history of major depression  INTEGUMENTARY: [ ]  rashes [ ]  ulcers  CONSTITUTIONAL: [ ]  fever [ ]  chills   PHYSICAL EXAM:  Filed Vitals:      BP:  122/78   Pulse:  88   Resp:  20   Height:  6' (1.829 m)   Weight:  219 lb (99.338 kg)   Body mass index is 29.7 kg/(m^2).  GENERAL: The patient is a well-nourished male, in no acute distress. The vital signs are documented above.  CARDIOVASCULAR: There is a regular rate and rhythm. I do not detect carotid bruits. He has a palpable right femoral pulse. I cannot palpate a left femoral pulse. I cannot palpate pedal pulses. I my Doppler exam he has monophasic Doppler signals in the left dorsalis pedis and posterior tibial positions with a biphasic right posterior tibial signal and a monophasic right anterior tibial signal.  PULMONARY: There is good air exchange bilaterally without  wheezing or rales.  ABDOMEN: Soft and non-tender with  normal pitched bowel sounds.  MUSCULOSKELETAL: There are no major deformities or cyanosis.  NEUROLOGIC: No focal weakness or paresthesias are detected.  SKIN: There are no ulcers or rashes noted.  PSYCHIATRIC: The patient has a normal affect.   DATA:  He did bring a MRI of the neck which was done by [facility]. This did show in plate degeneration at C4-C5 through C6-C7 which resulted in spinal stenosis at these levels was also some disc per surgeon that was maximal at the C6-C7 level which did produce some foraminal stenosis.   MEDICAL ISSUES:  PERIPHERAL VASCULAR DISEASE: The patient continues to smoke a pack per day of cigarettes. We have had a long discussion about the importance of tobacco cessation.I have discussed with the patient the nature of atherosclerosis, and emphasized the importance of maximal medical management including control of blood pressure, blood glucose, and cholesterol levels, antiplatelet agents, obtaining regular exercise, and cessation of smoking. The patient is aware that without maximal medical management the underlying atherosclerotic disease process will progress and also limit the benefit of any interventions. Given that his symptoms have progressed he would like to consider arteriography. However he was scheduled for a colonoscopy in September and given that we would might have to put him on Plavix after the procedure if we place a stent we elected to do this in October after his colonoscopy. I have reviewed with the patient the indications for arteriography. In addition, I have reviewed the potential complications of arteriography including but not limited to: Bleeding, arterial injury, arterial thrombosis, dye action, renal insufficiency, or other unpredictable medical problems. I have explained to the patient that if we find disease amenable to angioplasty we could potentially address this at the same time. I have discussed the potential complications of  angioplasty and stenting, including but not limited to: Bleeding, arterial thrombosis, arterial injury, dissection, or the need for surgical intervention. We will make further recommendations pending results of his arteriogram. Of note he is on aspirin and he is on Lipitor.   Antania Hoefling S  [facility] [place]  Beeper: [phone]

## 2013-05-10 NOTE — Op Note (Signed)
PATIENT: Alejandro Jones   MRN: [id number] DOB: May 21, 1956    DATE OF PROCEDURE: 05/10/2013  INDICATIONS: Eschol Auxier is a 57 y.o. male who has a long history of left lower extremity claudication. His symptoms progress and he wished to consider revascularization. He had undergone previous placement of a left common iliac artery stent in [place].  PROCEDURE:  1. Ultrasound-guided access the right common femoral artery. 2. Aortogram with bilateral iliac arteriogram and bilateral lower extremity runoff  SURGEON: Di Kindle. Edilia Bo, MD, FACS  ANESTHESIA: local with sedation   EBL: minimal  TECHNIQUE: The patient was taken to the peripheral vascular lab and sedated with 1 mg of Versed and 50 mcg of fentanyl. Both groins were prepped and draped in usual sterile fashion. Under ultrasound guidance, after the skin was anesthetized, the right common femoral artery was cannulated and a guidewire introduced into the infrarenal aorta under fluoroscopic control. A 5 French sheath was introduced over the wire. A pigtail catheter was positioned at the L1 vertebral body a flush aortogram obtained. Catheter was positioned at the aortic bifurcation and oblique iliac projections were obtained. Bilateral lower extremity runoff films were obtained. Next I obtained an aortic pressure through the pigtail catheter. I then measured the pressure in the right femoral sheath after the catheter was removed over a wire. There was no pressure gradient at rest.  FINDINGS:  1. There are single renal arteries bilaterally with no significant renal artery stenosis identified. 2. The infrarenal aorta is widely patent. The right common iliac artery is patent with some irregularity proximally but no significant stenosis and no resting gradient. The right external iliac artery and hypogastric artery are patent. On the left side, the left common iliac artery is occluded where he has had a previously placed stent. Is  reconstitution of the left external iliac artery via hypogastric collaterals. 3. On the right side, the common femoral, superficial femoral, and deep femoral arteries are patent. There is some irregularity of the adductor canal but no critical stenosis. The popliteal artery, anterior tibial, tibial peroneal trunk, peroneal, and posterior tibial arteries are patent. There is poor visualization of the anterior tibial artery and peroneal artery distally. 4. On the left side, the common femoral, deep femoral, superficial femoral, popliteal, anterior tibial, tibial peroneal trunk, peroneal, and posterior tibial arteries are patent. This poor visualization the tibials distally because of the proximal occlusion.  Waverly Ferrari, MD, FACS [facility]  DATE OF DICTATION:   05/10/2013

## 2013-05-10 NOTE — Progress Notes (Signed)
UP AND WALKED AND TOL WELL; RIGHT GROIN STABLE,NO BLEEDING OR HEMATOMA 

## 2013-06-22 ENCOUNTER — Other Ambulatory Visit: Payer: Self-pay | Admitting: Vascular Surgery

## 2013-06-22 DIAGNOSIS — I739 Peripheral vascular disease, unspecified: Secondary | ICD-10-CM

## 2013-06-22 DIAGNOSIS — Z48812 Encounter for surgical aftercare following surgery on the circulatory system: Secondary | ICD-10-CM

## 2013-08-10 ENCOUNTER — Encounter: Payer: Self-pay | Admitting: Vascular Surgery

## 2013-08-11 ENCOUNTER — Ambulatory Visit (INDEPENDENT_AMBULATORY_CARE_PROVIDER_SITE_OTHER): Payer: No Typology Code available for payment source | Admitting: Vascular Surgery

## 2013-08-11 ENCOUNTER — Ambulatory Visit: Payer: Self-pay | Admitting: Vascular Surgery

## 2013-08-11 ENCOUNTER — Ambulatory Visit (HOSPITAL_COMMUNITY)
Admission: RE | Admit: 2013-08-11 | Discharge: 2013-08-11 | Disposition: A | Discharge status: Home / Self Care | Payer: No Typology Code available for payment source | Source: Ambulatory Visit | Attending: Vascular Surgery | Admitting: Vascular Surgery

## 2013-08-11 ENCOUNTER — Encounter (HOSPITAL_COMMUNITY): Payer: Self-pay

## 2013-08-11 ENCOUNTER — Encounter: Payer: Self-pay | Admitting: Vascular Surgery

## 2013-08-11 VITALS — BP 118/81 | HR 113 | Ht 72.0 in | Wt 223.0 lb

## 2013-08-11 DIAGNOSIS — I739 Peripheral vascular disease, unspecified: Secondary | ICD-10-CM

## 2013-08-11 DIAGNOSIS — I70219 Atherosclerosis of native arteries of extremities with intermittent claudication, unspecified extremity: Secondary | ICD-10-CM

## 2013-08-11 DIAGNOSIS — Z48812 Encounter for surgical aftercare following surgery on the circulatory system: Secondary | ICD-10-CM | POA: Insufficient documentation

## 2013-08-11 NOTE — Assessment & Plan Note (Signed)
This patient has a left common iliac artery occlusion with stable left lower extremity claudication. He is in the process of trying to quit smoking. I've also encouraged him to stay as active as possible. He has a normal ABI on the right with a palpable right posterior tibial pulse. Therefore I do not think his symptoms in the right leg to be attributed to peripheral vascular disease. I have ordered follow up ABIs in 6 months and I'll see him back at that time. He knows to call sooner if he has problems.

## 2013-08-11 NOTE — Progress Notes (Signed)
   Patient name: Alejandro Jones MRN: [id number] DOB: 04/21/1956 Sex: male  REASON FOR VISIT: Follow up of peripheral Jones disease  HPI: Alejandro Jones is a 58 y.o. male who had undergone previous stenting of the left common iliac artery in [place]. He presented with progressive symptoms of the left lower Alejandro Jones. He had pain in the left leg brought on by ambulation and relieved with rest. Given the progression of his symptoms he wished to pursue arteriography. He underwent an arteriogram on 05/10/2013.  His arteriogram showed that the infrarenal aorta was widely patent as was the right common iliac artery. There was a mild irregularity proximally but no stenosis and no resting gradient. Also the right external iliac artery was patent. On the left side he had a chronic left common iliac artery occlusion.  The patient states that he has pain in both legs but more significantly on the left side. He is in the process of trying to quit smoking and is currently taking Chantix. He states that the pain the right leg is worse when the weather changes which may sleeping but this could possibly be arthritic pain.   REVIEW OF SYSTEMS: Alejandro Jones[X ] denotes positive finding; [  ] denotes negative finding  CARDIOVASCULAR:  [ ]  chest pain   [ ]  dyspnea on exertion    CONSTITUTIONAL:  [ ]  fever   [ ]  chills  PHYSICAL EXAM: Filed Vitals:   08/11/13 1613  BP: 118/81  Pulse: 113  Height: 6' (1.829 m)  Weight: 223 lb (101.152 kg)  SpO2: 97%   Body mass index is 30.24 kg/(m^2). GENERAL: The patient is a well-nourished male, in no acute distress. The vital signs are documented above. CARDIOVASCULAR: There is a regular rate and rhythm. I do not detect carotid bruits. He has a palpable right femoral pulse. I cannot palpate a left femoral pulse. I cannot palpate pedal pulses on the left. He has a palpable posterior tibial pulse on the right. PULMONARY: There is good air exchange bilaterally without wheezing or  rales. Abdomen is soft and nontender.  ARTERIAL DOPPLER STUDY: I have independently interpreted his arterial Doppler study which shows monophasic Doppler signals in the left foot in the posterior tibial and dorsalis pedis position with an ABI of 68%. Toe pressure on the left is 55 mmHg. On the right side, he has a biphasic posterior tibial signal with a monophasic dorsalis pedis signal and an ABI of 100%. Toe pressure on the right is 125 mmHg.  MEDICAL ISSUES:  Atherosclerosis of native arteries of the extremities with intermittent claudication This patient has a left common iliac artery occlusion with stable left lower extremity claudication. He is in the process of trying to quit smoking. I've also encouraged him to stay as active as possible. He has a normal ABI on the right with a palpable right posterior tibial pulse. Therefore I do not think his symptoms in the right leg to be attributed to peripheral Jones disease. I have ordered follow up ABIs in 6 months and I'll see him back at that time. He knows to call sooner if he has problems.   Alejandro Jones Alejandro Jones and Alejandro Jones of [place] Beeper: [phone]

## 2014-01-14 DIAGNOSIS — K602 Anal fissure, unspecified: Secondary | ICD-10-CM | POA: Insufficient documentation

## 2014-02-09 ENCOUNTER — Encounter (HOSPITAL_COMMUNITY): Payer: Self-pay

## 2014-02-09 ENCOUNTER — Ambulatory Visit: Payer: Self-pay | Admitting: Vascular Surgery

## 2014-02-23 ENCOUNTER — Encounter (HOSPITAL_COMMUNITY): Payer: Self-pay

## 2014-02-23 ENCOUNTER — Ambulatory Visit: Payer: Self-pay | Admitting: Vascular Surgery

## 2014-03-03 DIAGNOSIS — K6289 Other specified diseases of anus and rectum: Secondary | ICD-10-CM | POA: Insufficient documentation

## 2014-03-22 ENCOUNTER — Emergency Department (HOSPITAL_BASED_OUTPATIENT_CLINIC_OR_DEPARTMENT_OTHER)
Admission: EM | Admit: 2014-03-22 | Discharge: 2014-03-22 | Discharge status: Against Medical Advice | Payer: Medicare HMO | Attending: Emergency Medicine | Admitting: Emergency Medicine

## 2014-03-22 ENCOUNTER — Emergency Department (HOSPITAL_BASED_OUTPATIENT_CLINIC_OR_DEPARTMENT_OTHER): Payer: Medicare HMO

## 2014-03-22 ENCOUNTER — Encounter (HOSPITAL_BASED_OUTPATIENT_CLINIC_OR_DEPARTMENT_OTHER): Payer: Self-pay | Admitting: Radiology

## 2014-03-22 DIAGNOSIS — J159 Unspecified bacterial pneumonia: Secondary | ICD-10-CM | POA: Insufficient documentation

## 2014-03-22 DIAGNOSIS — R197 Diarrhea, unspecified: Secondary | ICD-10-CM | POA: Insufficient documentation

## 2014-03-22 DIAGNOSIS — K219 Gastro-esophageal reflux disease without esophagitis: Secondary | ICD-10-CM | POA: Insufficient documentation

## 2014-03-22 DIAGNOSIS — R109 Unspecified abdominal pain: Secondary | ICD-10-CM | POA: Diagnosis not present

## 2014-03-22 DIAGNOSIS — R509 Fever, unspecified: Secondary | ICD-10-CM | POA: Diagnosis present

## 2014-03-22 DIAGNOSIS — E119 Type 2 diabetes mellitus without complications: Secondary | ICD-10-CM | POA: Insufficient documentation

## 2014-03-22 DIAGNOSIS — IMO0002 Reserved for concepts with insufficient information to code with codable children: Secondary | ICD-10-CM | POA: Insufficient documentation

## 2014-03-22 DIAGNOSIS — I10 Essential (primary) hypertension: Secondary | ICD-10-CM | POA: Diagnosis not present

## 2014-03-22 DIAGNOSIS — J45901 Unspecified asthma with (acute) exacerbation: Secondary | ICD-10-CM | POA: Insufficient documentation

## 2014-03-22 DIAGNOSIS — Z7982 Long term (current) use of aspirin: Secondary | ICD-10-CM | POA: Insufficient documentation

## 2014-03-22 DIAGNOSIS — A419 Sepsis, unspecified organism: Secondary | ICD-10-CM | POA: Diagnosis not present

## 2014-03-22 DIAGNOSIS — R0902 Hypoxemia: Secondary | ICD-10-CM | POA: Insufficient documentation

## 2014-03-22 DIAGNOSIS — Z79899 Other long term (current) drug therapy: Secondary | ICD-10-CM | POA: Insufficient documentation

## 2014-03-22 DIAGNOSIS — F17200 Nicotine dependence, unspecified, uncomplicated: Secondary | ICD-10-CM | POA: Diagnosis not present

## 2014-03-22 DIAGNOSIS — J189 Pneumonia, unspecified organism: Secondary | ICD-10-CM

## 2014-03-22 DIAGNOSIS — Z91040 Latex allergy status: Secondary | ICD-10-CM | POA: Diagnosis not present

## 2014-03-22 DIAGNOSIS — I252 Old myocardial infarction: Secondary | ICD-10-CM | POA: Insufficient documentation

## 2014-03-22 LAB — CBC WITH DIFFERENTIAL/PLATELET
BASOS ABS: 0 10*3/uL (ref 0.0–0.1)
BASOS PCT: 0 % (ref 0–1)
EOS ABS: 0 10*3/uL (ref 0.0–0.7)
Eosinophils Relative: 0 % (ref 0–5)
HCT: 42.2 % (ref 39.0–52.0)
Hemoglobin: 14.1 g/dL (ref 13.0–17.0)
Lymphocytes Relative: 3 % — ABNORMAL LOW (ref 12–46)
Lymphs Abs: 0.6 10*3/uL — ABNORMAL LOW (ref 0.7–4.0)
MCH: 30.7 pg (ref 26.0–34.0)
MCHC: 33.4 g/dL (ref 30.0–36.0)
MCV: 91.7 fL (ref 78.0–100.0)
Monocytes Absolute: 0.9 10*3/uL (ref 0.1–1.0)
Monocytes Relative: 4 % (ref 3–12)
NEUTROS ABS: 21.1 10*3/uL — AB (ref 1.7–7.7)
NEUTROS PCT: 93 % — AB (ref 43–77)
PLATELETS: 338 10*3/uL (ref 150–400)
RBC: 4.6 MIL/uL (ref 4.22–5.81)
RDW: 14.4 % (ref 11.5–15.5)
WBC: 22.6 10*3/uL — ABNORMAL HIGH (ref 4.0–10.5)

## 2014-03-22 LAB — URINALYSIS, ROUTINE W REFLEX MICROSCOPIC
Bilirubin Urine: NEGATIVE
Glucose, UA: NEGATIVE mg/dL
HGB URINE DIPSTICK: NEGATIVE
Ketones, ur: NEGATIVE mg/dL
Leukocytes, UA: NEGATIVE
Nitrite: NEGATIVE
PROTEIN: NEGATIVE mg/dL
Specific Gravity, Urine: 1.02 (ref 1.005–1.030)
UROBILINOGEN UA: 1 mg/dL (ref 0.0–1.0)
pH: 6.5 (ref 5.0–8.0)

## 2014-03-22 LAB — LIPASE, BLOOD: Lipase: 15 U/L (ref 11–59)

## 2014-03-22 LAB — COMPREHENSIVE METABOLIC PANEL
ALBUMIN: 3.6 g/dL (ref 3.5–5.2)
ALK PHOS: 61 U/L (ref 39–117)
ALT: 11 U/L (ref 0–53)
AST: 13 U/L (ref 0–37)
Anion gap: 12 (ref 5–15)
BILIRUBIN TOTAL: 0.5 mg/dL (ref 0.3–1.2)
BUN: 15 mg/dL (ref 6–23)
CHLORIDE: 96 meq/L (ref 96–112)
CO2: 30 mEq/L (ref 19–32)
Calcium: 9.9 mg/dL (ref 8.4–10.5)
Creatinine, Ser: 1 mg/dL (ref 0.50–1.35)
GFR calc Af Amer: >90 mL/min (ref >90)
GFR calc non Af Amer: 80 mL/min — ABNORMAL LOW (ref >90)
Glucose, Bld: 201 mg/dL — ABNORMAL HIGH (ref 70–99)
POTASSIUM: 3.7 meq/L (ref 3.7–5.3)
SODIUM: 138 meq/L (ref 137–147)
Total Protein: 7.7 g/dL (ref 6.0–8.3)

## 2014-03-22 LAB — I-STAT CG4 LACTIC ACID, ED: Lactic Acid, Venous: 1.2 mmol/L (ref 0.5–2.2)

## 2014-03-22 LAB — CBG MONITORING, ED: GLUCOSE-CAPILLARY: 188 mg/dL — AB (ref 70–99)

## 2014-03-22 LAB — TROPONIN I: Troponin I: <0.3 ng/mL (ref <0.30)

## 2014-03-22 MED ORDER — DEXTROSE 5 % IV SOLN
500.0000 mg | Freq: Once | INTRAVENOUS | Status: AC
  Administered 2014-03-22: 500 mg via INTRAVENOUS
  Filled 2014-03-22: qty 500

## 2014-03-22 MED ORDER — DEXTROSE 5 % IV SOLN
1.0000 g | Freq: Once | INTRAVENOUS | Status: AC
  Administered 2014-03-22: 1 g via INTRAVENOUS

## 2014-03-22 MED ORDER — SODIUM CHLORIDE 0.9 % IV BOLUS (SEPSIS)
1000.0000 mL | INTRAVENOUS | Status: AC
  Administered 2014-03-22: 1000 mL via INTRAVENOUS

## 2014-03-22 MED ORDER — ACETAMINOPHEN 325 MG PO TABS
650.0000 mg | ORAL_TABLET | Freq: Once | ORAL | Status: AC
  Administered 2014-03-22: 650 mg via ORAL
  Filled 2014-03-22: qty 2

## 2014-03-22 MED ORDER — IOHEXOL 300 MG/ML  SOLN
100.0000 mL | Freq: Once | INTRAMUSCULAR | Status: AC | PRN
  Administered 2014-03-22: 100 mL via INTRAVENOUS

## 2014-03-22 MED ORDER — IOHEXOL 300 MG/ML  SOLN
50.0000 mL | Freq: Once | INTRAMUSCULAR | Status: AC | PRN
  Administered 2014-03-22: 50 mL via ORAL

## 2014-03-22 MED ORDER — CEFTRIAXONE SODIUM 1 G IJ SOLR
INTRAMUSCULAR | Status: DC
Start: 2014-03-22 — End: 2014-03-23
  Filled 2014-03-22: qty 10

## 2014-03-22 MED ORDER — ALBUTEROL SULFATE (2.5 MG/3ML) 0.083% IN NEBU
5.0000 mg | INHALATION_SOLUTION | Freq: Once | RESPIRATORY_TRACT | Status: AC
  Administered 2014-03-22: 5 mg via RESPIRATORY_TRACT
  Filled 2014-03-22: qty 6

## 2014-03-22 MED ORDER — DOXYCYCLINE HYCLATE 100 MG PO CAPS: 100.0000 mg | ORAL_CAPSULE | Freq: Two times a day (BID) | ORAL | Status: DC

## 2014-03-22 NOTE — ED Notes (Signed)
Trail off O2 SpO2 dropped to 87-90%.  MD aware 2l NCO2 resumed.  SpO2 now 93%

## 2014-03-22 NOTE — ED Provider Notes (Signed)
CSN: [id number]     Arrival date & time 03/22/14  1646 History   First MD Initiated Contact with Patient 03/22/14 1707     Chief Complaint  Patient presents with  . Fever     (Consider location/radiation/quality/duration/timing/severity/associated sxs/prior Treatment) Patient is a 58 y.o. male presenting with fever and abdominal pain. The history is provided by the patient.  Fever Temp source:  Oral Severity:  Moderate Onset quality:  Sudden Duration:  6 hours Timing:  Constant Progression:  Improving Chronicity:  New Relieved by:  Acetaminophen Worsened by:  Nothing tried Ineffective treatments:  None tried Associated symptoms: cough and diarrhea   Associated symptoms: no chest pain, no dysuria, no headaches, no nausea, no rhinorrhea and no vomiting   Abdominal Pain Pain location:  L flank Pain quality: aching   Pain radiates to:  Does not radiate Pain severity:  Mild Onset quality:  Gradual Duration:  8 hours Timing:  Intermittent Progression:  Improving Chronicity:  New Context comment:  At rest Relieved by:  Nothing Worsened by:  Nothing tried Ineffective treatments:  None tried Associated symptoms: cough and diarrhea   Associated symptoms: no chest pain, no dysuria, no fever, no hematuria, no nausea, no shortness of breath and no vomiting     Past Medical History  Diagnosis Date  . Hypertension   . Diabetes mellitus   . Acid reflux   . Asthma   . Constipation   . Myocardial infarction   . Peripheral vascular disease    Past Surgical History  Procedure Laterality Date  . Ankle surgery    . Iliac artery stent     Family History  Problem Relation Age of Onset  . Diabetes Mother   . Diabetes Sister   . Cancer Brother   . Diabetes Brother    History  Substance Use Topics  . Smoking status: Current Every Day Smoker -- 1.00 packs/day for 45 years    Types: Cigarettes  . Smokeless tobacco: Never Used     Comment: pt states that he is now taking  chantix and he is slowly cutting back  . Alcohol Use: No    Review of Systems  Constitutional: Negative for fever.  HENT: Negative for drooling and rhinorrhea.   Eyes: Negative for pain.  Respiratory: Positive for cough. Negative for shortness of breath.   Cardiovascular: Negative for chest pain and leg swelling.  Gastrointestinal: Positive for diarrhea. Negative for nausea, vomiting and abdominal pain.  Genitourinary: Negative for dysuria and hematuria.  Musculoskeletal: Negative for gait problem and neck pain.  Skin: Negative for color change.  Neurological: Negative for numbness and headaches.  Hematological: Negative for adenopathy.  Psychiatric/Behavioral: Negative for behavioral problems.  All other systems reviewed and are negative.     Allergies  Latex  Home Medications   Prior to Admission medications   Medication Sig Start Date End Date Taking? Authorizing Provider  ALPRAZolam Prudy Feeler) 0.5 MG tablet Take 0.5 mg by mouth at bedtime as needed. For sleep    Historical Provider, MD  aspirin EC 81 MG tablet Take 81 mg by mouth daily.      Historical Provider, MD  dextromethorphan (DELSYM) 30 MG/5ML liquid Take 60 mg by mouth as needed for cough.    Historical Provider, MD  DM-Doxylamine-Acetaminophen (NYQUIL COLD & FLU PO) Take 30 mLs by mouth daily as needed (for cough).     Historical Provider, MD  docusate sodium (COLACE) 100 MG capsule Take 300 mg by mouth at bedtime.  Historical Provider, MD  esomeprazole (NEXIUM) 40 MG capsule Take 1 capsule (40 mg total) by mouth daily before breakfast. 10/05/12   Richarda Overlie, MD  fluocinonide cream (LIDEX) 0.05 % Apply 1 application topically daily as needed (for sores).    Historical Provider, MD  fluticasone (FLOVENT HFA) 110 MCG/ACT inhaler Inhale 2 puffs into the lungs 2 (two) times daily.    Historical Provider, MD  glipiZIDE (GLUCOTROL XL) 5 MG 24 hr tablet Take 2 tablets (10 mg total) by mouth daily. 10/05/12   Richarda Overlie, MD   hydrocortisone (ANUSOL-HC) 2.5 % rectal cream Place 1 application rectally 2 (two) times daily. 10/05/12   Richarda Overlie, MD  ibuprofen (ADVIL,MOTRIN) 200 MG tablet Take 600 mg by mouth every 6 (six) hours as needed. For pain.    Historical Provider, MD  lidocaine (XYLOCAINE) 5 % ointment Apply 1 application topically as needed (for hemmorhoids).  11/18/12   Historical Provider, MD  losartan-hydrochlorothiazide (HYZAAR) 100-12.5 MG per tablet Take 1 tablet by mouth daily.    Historical Provider, MD  Menthol, Topical Analgesic, 5 % PADS Apply 1 patch topically at bedtime as needed (for leg pain).     Historical Provider, MD  nitroGLYCERIN (NITROGLYN) 2 % ointment Place 1 inch onto the skin every 6 (six) hours. 10/05/12   Richarda Overlie, MD  omeprazole (PRILOSEC) 20 MG capsule Take 20 mg by mouth daily.    Historical Provider, MD  Saxagliptin-Metformin (KOMBIGLYZE XR) 12-998 MG TB24 Take 1 tablet by mouth daily. 10/05/12   Richarda Overlie, MD  tiotropium (SPIRIVA) 18 MCG inhalation capsule Place 18 mcg into inhaler and inhale daily.    Historical Provider, MD  traMADol (ULTRAM) 50 MG tablet Take 50 mg by mouth every 6 (six) hours as needed. For pain    Historical Provider, MD  varenicline (CHANTIX) 0.5 MG tablet Take 0.5 mg by mouth daily.    Historical Provider, MD   BP 101/67  Pulse 116  Temp(Src) 100.4 F (38 C) (Oral)  Resp 20  Ht 6' (1.829 m)  Wt 215 lb (97.523 kg)  BMI 29.15 kg/m2  SpO2 90% Physical Exam  Nursing note and vitals reviewed. Constitutional: He is oriented to person, place, and time. He appears well-developed and well-nourished.  HENT:  Head: Normocephalic and atraumatic.  Right Ear: External ear normal.  Left Ear: External ear normal.  Nose: Nose normal.  Mouth/Throat: Oropharynx is clear and moist. No oropharyngeal exudate.  Eyes: Conjunctivae and EOM are normal. Pupils are equal, round, and reactive to light.  Neck: Normal range of motion. Neck supple.  Cardiovascular:  Regular rhythm, normal heart sounds and intact distal pulses.  Exam reveals no gallop and no friction rub.   No murmur heard. HR 116  Pulmonary/Chest: He is in respiratory distress (mild tachypnea). He has wheezes.  Faint expiratory wheeze and mild rhonchi bilaterally.  Abdominal: Soft. Bowel sounds are normal. He exhibits no distension. There is no tenderness. There is no rebound and no guarding.  Musculoskeletal: Normal range of motion. He exhibits no edema and no tenderness.  Neurological: He is alert and oriented to person, place, and time.  Skin: Skin is warm and dry.  Psychiatric: He has a normal mood and affect. His behavior is normal.    ED Course  Procedures (including critical care time) Labs Review Labs Reviewed  CBC WITH DIFFERENTIAL - Abnormal; Notable for the following:    WBC 22.6 (*)    Neutrophils Relative % 93 (*)    Neutro  Abs 21.1 (*)    Lymphocytes Relative 3 (*)    Lymphs Abs 0.6 (*)    All other components within normal limits  COMPREHENSIVE METABOLIC PANEL - Abnormal; Notable for the following:    Glucose, Bld 201 (*)    GFR calc non Af Amer 80 (*)    All other components within normal limits  CBG MONITORING, ED - Abnormal; Notable for the following:    Glucose-Capillary 188 (*)    All other components within normal limits  CULTURE, BLOOD (ROUTINE X 2)  URINE CULTURE  URINALYSIS, ROUTINE W REFLEX MICROSCOPIC  LIPASE, BLOOD  TROPONIN I  I-STAT CG4 LACTIC ACID, ED    Imaging Review Dg Chest Port 1 View  03/22/2014   CLINICAL DATA:  Cough, fever, and shortness of breath ; history of diabetes, asthma, and previous MI  EXAM: PORTABLE CHEST - 1 VIEW  COMPARISON:  PA and lateral chest x-ray dated October 05, 2011  FINDINGS: The lungs are adequately inflated. The interstitial markings are increased bilaterally. There is confluent density lateral to the left cardiac apex. The cardiac silhouette is mildly enlarged. The pulmonary vascularity is prominent centrally.  There is no pleural effusion.  IMPRESSION: Reactive airway disease with superimposed subsegmental atelectasis or developing pneumonia at the left lung base. There is no overt evidence of CHF. A followup PA and lateral chest x-ray would be of value.   Electronically Signed   By: David  [place]   On: 03/22/2014 17:24     EKG Interpretation   Date/Time:  Tuesday March 22 2014 17:48:44 EDT Ventricular Rate:  116 PR Interval:  140 QRS Duration: 82 QT Interval:  344 QTC Calculation: 478 R Axis:   72 Text Interpretation:  Sinus tachycardia Possible Anterior infarct , age  undetermined No significant change since last tracing Confirmed by  Fareeha Evon  MD, Mauricio Dahlen ([id number]) on 03/22/2014 5:58:38 PM      MDM   Final diagnoses:  CAP (community acquired pneumonia)  Hypoxia  Sepsis, due to unspecified organism    5:34 PM 58 y.o. male w hx of HTN, DM, MI, PVD who pw fever and left flank pain. His wife notes that he began complaining of the flank pain earlier today and she noted that he had a temperature of 102 at home. He states that he has had some diarrhea but denies any vomiting. He denies any shortness of breath but is hypoxic to 88% on room air on exam. She states that he has a chronic cough which is not significantly changed. He has no focal tenderness to palpation of his abdomen on exam. He has a low-grade temperature here and is mildly tachycardic. Blood pressure is 101/67. Will perform septic workup. Initial lab work prior to my evaluation shows an elevated white blood cell count 20.6 and concern for pneumonia on his chest x-ray. Will treat for CAP with Rocephin and Azithro.  Pt would like to leave AMA. I strongly encouraged him to stay given the CAP, hypoxia, and sepsis which was explained to him and his partner. I informed him that by leaving he could become more sob leading to respiratory arrest, develop worsening sx, undesired consequences, disability, or death. I informed him that he needed  oxygen and admission for IV abx and fluids. He has decision making capacity and is alert/oriented/ambulatory. He refuses as he does not want to pay for an inpt stay. He did get IV abx here and will send home on doxycycline. Welcomed his return at any time.  8:59 PM:  I have discussed the diagnosis/risks/treatment options with the patient and family and would recommend he f/u w/ his pcp tomorrow given his leaving AMA. We also discussed returning to the ED immediately if new or worsening sx occur. We discussed the sx which are most concerning (e.g., worsening sob, cp, abd pain) that necessitate immediate return. Medications administered to the patient during their visit and any new prescriptions provided to the patient are listed below.  Medications given during this visit Medications  sodium chloride 0.9 % bolus 1,000 mL (1,000 mLs Intravenous New Bag/Given 03/22/14 2008)  cefTRIAXone (ROCEPHIN) 1 G injection (  Not Given 03/22/14 2008)  albuterol (PROVENTIL) (2.5 MG/3ML) 0.083% nebulizer solution 5 mg (5 mg Nebulization Given 03/22/14 1729)  cefTRIAXone (ROCEPHIN) 1 g in dextrose 5 % 50 mL IVPB (1 g Intravenous New Bag/Given 03/22/14 2007)  azithromycin (ZITHROMAX) 500 mg in dextrose 5 % 250 mL IVPB (0 mg Intravenous Stopped 03/22/14 2008)  acetaminophen (TYLENOL) tablet 650 mg (650 mg Oral Given 03/22/14 1856)  sodium chloride 0.9 % bolus 1,000 mL (0 mLs Intravenous Stopped 03/22/14 2008)  iohexol (OMNIPAQUE) 300 MG/ML solution 50 mL (50 mLs Oral Contrast Given 03/22/14 1800)  iohexol (OMNIPAQUE) 300 MG/ML solution 100 mL (100 mLs Intravenous Contrast Given 03/22/14 1838)    Discharge Medication List as of 03/22/2014  9:01 PM    START taking these medications   Details  doxycycline (VIBRAMYCIN) 100 MG capsule Take 1 capsule (100 mg total) by mouth 2 (two) times daily. One po bid x 7 days, Starting 03/22/2014, Until Discontinued, Print         Purvis Sheffield, MD 03/23/14 1104

## 2014-03-22 NOTE — ED Notes (Signed)
Patient holding his left chest at time with "spasms" to his left chest and ribs area under his axilla region. The patient is gray in color, and warm to touch.

## 2014-03-22 NOTE — ED Notes (Signed)
Pt wife reports the pt had a fever today around 1430. Pt also having lower abdominal pain and diarrhea since yesterday.

## 2014-03-24 LAB — URINE CULTURE
Colony Count: NO GROWTH
Culture: NO GROWTH

## 2014-03-28 LAB — CULTURE, BLOOD (ROUTINE X 2): Culture: NO GROWTH

## 2014-03-30 ENCOUNTER — Encounter (HOSPITAL_COMMUNITY): Payer: Self-pay

## 2014-03-30 ENCOUNTER — Ambulatory Visit: Payer: Self-pay | Admitting: Vascular Surgery

## 2014-06-15 ENCOUNTER — Encounter (HOSPITAL_COMMUNITY): Payer: Self-pay

## 2014-06-15 ENCOUNTER — Ambulatory Visit: Payer: Self-pay | Admitting: Vascular Surgery

## 2014-06-21 ENCOUNTER — Encounter: Payer: Self-pay | Admitting: Vascular Surgery

## 2014-06-22 ENCOUNTER — Ambulatory Visit (HOSPITAL_COMMUNITY)
Admission: RE | Admit: 2014-06-22 | Discharge: 2014-06-22 | Disposition: A | Discharge status: Home / Self Care | Payer: Medicare HMO | Source: Ambulatory Visit | Attending: Vascular Surgery | Admitting: Vascular Surgery

## 2014-06-22 ENCOUNTER — Ambulatory Visit: Payer: Self-pay | Admitting: Vascular Surgery

## 2014-06-22 ENCOUNTER — Encounter (HOSPITAL_COMMUNITY): Payer: Self-pay

## 2014-06-22 ENCOUNTER — Ambulatory Visit (INDEPENDENT_AMBULATORY_CARE_PROVIDER_SITE_OTHER): Payer: Medicare HMO | Admitting: Vascular Surgery

## 2014-06-22 ENCOUNTER — Encounter: Payer: Self-pay | Admitting: Vascular Surgery

## 2014-06-22 VITALS — BP 104/67 | HR 100 | Resp 18 | Ht 72.0 in | Wt 224.4 lb

## 2014-06-22 DIAGNOSIS — Z720 Tobacco use: Secondary | ICD-10-CM | POA: Diagnosis not present

## 2014-06-22 DIAGNOSIS — E119 Type 2 diabetes mellitus without complications: Secondary | ICD-10-CM | POA: Diagnosis not present

## 2014-06-22 DIAGNOSIS — I739 Peripheral vascular disease, unspecified: Secondary | ICD-10-CM | POA: Diagnosis present

## 2014-06-22 DIAGNOSIS — I10 Essential (primary) hypertension: Secondary | ICD-10-CM | POA: Diagnosis not present

## 2014-06-22 DIAGNOSIS — I70219 Atherosclerosis of native arteries of extremities with intermittent claudication, unspecified extremity: Secondary | ICD-10-CM | POA: Insufficient documentation

## 2014-06-22 NOTE — Assessment & Plan Note (Signed)
This patient has a known left common iliac artery occlusion. His ABIs are stable and his symptoms have not changed. I have again discussed with him the importance of tobacco cessation. I've encouraged him to stay as active as possible. I ordered follow up ABIs and one urinalysis and back at that time. He knows to call sooner if he has problems.

## 2014-06-22 NOTE — Progress Notes (Signed)
[facility]  Patient name: Alejandro Jones MRN: [id number] DOB: 01/29/1956 Sex: male  REASON FOR VISIT: Follow up of peripheral vascular disease.  HPI: Alejandro Jones is a 58 y.o. male who I last saw on 08/11/2013. He had undergone a previous left common iliac artery stent in [place]. He presented with left lower extremity claudication. He underwent an arteriogram on 05/10/2013 that showed that the infrarenal aorta was widely patent as was the right common iliac artery. On the left side, he had a chronic left common iliac artery occlusion. When I saw him last he was trying to quit smoking. At that time, ABI on the left was 68%. Toe pressure was 55 mmHg. He had a normal ABI on the right. Our plan was to continue conservative treatment with attempts at tobacco cessation any structured walking program. He comes in for a 6 month follow up visit.  Since I saw him last he does complain of bilateral calf claudication. His symptoms are more significant on the left side. The pain is brought on by ambulation relieved with rest. I do not get any history consistent with rest pain. He denies any history of nonhealing ulcers on his feet. He did fracture his right great toe but this healed without difficulty.  His only other complaint has been some wounds on his back and he has a difficult dermatologic issue and [place] [facility] [place]  Past Medical History  Diagnosis Date  . Hypertension   . Diabetes mellitus   . Acid reflux   . Asthma   . Constipation   . Myocardial infarction   . Peripheral vascular disease   . COPD (chronic obstructive pulmonary disease)    Family History  Problem Relation Age of Onset  . Diabetes Mother   . Diabetes Sister   . Cancer Brother   . Diabetes Brother    SOCIAL HISTORY: History  Substance Use Topics  . Smoking status: Current Every Day Smoker -- 1.00 packs/day for 45 years    Types: Cigarettes  .  Smokeless tobacco: Never Used     Comment: pt states that he is now taking chantix and he is slowly cutting back  . Alcohol Use: No   Allergies  Allergen Reactions  . Latex Itching  . Chantix [Varenicline]     Extreme anger and depression    Current Outpatient Prescriptions  Medication Sig Dispense Refill  . aspirin EC 81 MG tablet Take 81 mg by mouth daily.      Marland Kitchen. atorvastatin (LIPITOR) 20 MG tablet Take 20 mg by mouth daily.    . cetirizine (ZYRTEC) 10 MG tablet Take 10 mg by mouth at bedtime.    . cyproheptadine (PERIACTIN) 4 MG tablet Take 4 mg by mouth daily.    Marland Kitchen. dextromethorphan (DELSYM) 30 MG/5ML liquid Take 60 mg by mouth as needed for cough.    . DM-Doxylamine-Acetaminophen (NYQUIL COLD & FLU PO) Take 30 mLs by mouth daily as needed (for cough).     . docusate sodium (COLACE) 100 MG capsule Take 300 mg by mouth at bedtime.    . fluocinonide cream (LIDEX) 0.05 % Apply 1 application topically daily as needed (for sores).    Marland Kitchen. glipiZIDE (GLUCOTROL XL) 5 MG 24 hr tablet Take 2 tablets (10 mg total) by mouth daily. 30 tablet 0  . hydrocortisone (ANUSOL-HC) 2.5 % rectal cream Place 1 application rectally 2 (two) times daily. 30 g 3  . ibuprofen (  ADVIL,MOTRIN) 200 MG tablet Take 600 mg by mouth every 6 (six) hours as needed. For pain.    Marland Kitchen. lidocaine (XYLOCAINE) 5 % ointment Apply 1 application topically as needed (for hemmorhoids).     . losartan-hydrochlorothiazide (HYZAAR) 100-12.5 MG per tablet Take 1 tablet by mouth daily.    . Menthol, Topical Analgesic, 5 % PADS Apply 1 patch topically at bedtime as needed (for leg pain).     Marland Kitchen. omeprazole (PRILOSEC) 20 MG capsule Take 20 mg by mouth daily.    . pioglitazone (ACTOS) 30 MG tablet Take 30 mg by mouth daily.    . Saxagliptin-Metformin (KOMBIGLYZE XR) 12-998 MG TB24 Take 1 tablet by mouth daily. 30 tablet 0  . traMADol (ULTRAM) 50 MG tablet Take 50 mg by mouth every 6 (six) hours as needed. For pain    . Umeclidinium-Vilanterol  (ANORO ELLIPTA) 62.5-25 MCG/INH AEPB Inhale into the lungs as needed.    . ALPRAZolam (XANAX) 0.5 MG tablet Take 0.5 mg by mouth at bedtime as needed. For sleep    . doxycycline (VIBRAMYCIN) 100 MG capsule Take 1 capsule (100 mg total) by mouth 2 (two) times daily. One po bid x 7 days 14 capsule 0  . esomeprazole (NEXIUM) 40 MG capsule Take 1 capsule (40 mg total) by mouth daily before breakfast. 30 capsule 2  . fluticasone (FLOVENT HFA) 110 MCG/ACT inhaler Inhale 2 puffs into the lungs 2 (two) times daily.    . nitroGLYCERIN (NITROGLYN) 2 % ointment Place 1 inch onto the skin every 6 (six) hours. 30 g 2  . tiotropium (SPIRIVA) 18 MCG inhalation capsule Place 18 mcg into inhaler and inhale daily.    . varenicline (CHANTIX) 0.5 MG tablet Take 0.5 mg by mouth daily.     No current facility-administered medications for this visit.   REVIEW OF SYSTEMS: [name][X ] denotes positive finding; [  ] denotes negative finding  CARDIOVASCULAR:  [name][X ] chest pain   [ ]  chest pressure   [ ]  palpitations   [name][X ] orthopnea   [name][X ] dyspnea on exertion   [ ]  claudication   [ ]  rest pain   [ ]  DVT   [ ]  phlebitis PULMONARY:   [name][X ] productive cough   [name][X ] asthma   [name][X ] wheezing NEUROLOGIC:   [ ]  weakness  [ ]  paresthesias  [ ]  aphasia  [ ]  amaurosis  [ ]  dizziness HEMATOLOGIC:   [ ]  bleeding problems   [ ]  clotting disorders MUSCULOSKELETAL:  [ ]  joint pain   [ ]  joint swelling [ ]  leg swelling GASTROINTESTINAL: [ ]   blood in stool  [ ]   hematemesis GENITOURINARY:  [ ]   dysuria  [ ]   hematuria PSYCHIATRIC:  [ ]  history of major depression INTEGUMENTARY:  [name][X ] rashes  [ ]  ulcers CONSTITUTIONAL:  [ ]  fever   [ ]  chills  PHYSICAL EXAM: Filed Vitals:   06/22/14 1451  BP: 104/67  Pulse: 100  Resp: 18  Height: 6' (1.829 m)  Weight: 224 lb 6.4 oz (101.787 kg)   Body mass index is 30.43 kg/(m^2). GENERAL: The patient is a well-nourished male, in no acute distress. The vital signs are documented above. CARDIOVASCULAR:  There is a regular rate and rhythm. I do not detect carotid bruits. He has palpable femoral pulses. I cannot palpate popliteal or pedal pulses. He has no significant lower extremity swelling. PULMONARY: There is good air exchange bilaterally without wheezing or rales. ABDOMEN: Soft  and non-tender with normal pitched bowel sounds.  MUSCULOSKELETAL: There are no major deformities or cyanosis. NEUROLOGIC: No focal weakness or paresthesias are detected. SKIN: there are no wounds on his feet. He does have some full-thickness wounds on his back which are being followed by the dermatologist.PSYCHIATRIC: The patient has a normal affect.  DATA:  ARTERIAL DOPPLER STUDY: I have independently interpreted his arterial Doppler study. On the right side he has a normal ABI 100% with a triphasic posterior tibial signal and a biphasic dorsalis pedis signal. Toprol on the right is 100 mmHg. On the left side, his ABI is 63% down slightly from 68%. This does not appear to be significant. He has monophasic signals in the left posterior tibial and dorsalis pedis positions within toe pressure of 69 mmHg.  MEDICAL ISSUES: Atherosclerosis of native arteries of the extremities with intermittent claudication This patient has a known left common iliac artery occlusion. His ABIs are stable and his symptoms have not changed. I have again discussed with him the importance of tobacco cessation. I've encouraged him to stay as active as possible. I ordered follow up ABIs and one urinalysis and back at that time. He knows to call sooner if he has problems.     Return in about 1 year (around 06/23/2015).   Kordae Buonocore S [facility] [place] Beeper: [phone]

## 2014-06-22 NOTE — Addendum Note (Signed)
Addended by: Sharee PimpleMCCHESNEY, MARILYN K on: 06/22/2014 03:46 PM   Modules accepted: Orders

## 2014-06-22 NOTE — Addendum Note (Signed)
Addended by: Sharee PimpleMCCHESNEY, Drina Jobst K on: 06/22/2014 03:44 PM   Modules accepted: Orders

## 2014-07-14 ENCOUNTER — Encounter (HOSPITAL_COMMUNITY): Payer: Self-pay | Admitting: Vascular Surgery

## 2014-11-07 DIAGNOSIS — E119 Type 2 diabetes mellitus without complications: Secondary | ICD-10-CM | POA: Insufficient documentation

## 2014-11-07 DIAGNOSIS — H40033 Anatomical narrow angle, bilateral: Secondary | ICD-10-CM | POA: Insufficient documentation

## 2014-11-07 DIAGNOSIS — H2513 Age-related nuclear cataract, bilateral: Secondary | ICD-10-CM | POA: Insufficient documentation

## 2015-01-08 ENCOUNTER — Encounter (HOSPITAL_COMMUNITY): Payer: Self-pay | Admitting: *Deleted

## 2015-01-08 ENCOUNTER — Emergency Department (HOSPITAL_COMMUNITY): Payer: Medicare Other

## 2015-01-08 ENCOUNTER — Emergency Department (HOSPITAL_COMMUNITY)
Admission: EM | Admit: 2015-01-08 | Discharge: 2015-01-08 | Discharge status: Against Medical Advice | Payer: Medicare Other | Attending: Emergency Medicine | Admitting: Emergency Medicine

## 2015-01-08 DIAGNOSIS — R000 Tachycardia, unspecified: Secondary | ICD-10-CM | POA: Diagnosis not present

## 2015-01-08 DIAGNOSIS — Z7982 Long term (current) use of aspirin: Secondary | ICD-10-CM | POA: Diagnosis not present

## 2015-01-08 DIAGNOSIS — J441 Chronic obstructive pulmonary disease with (acute) exacerbation: Secondary | ICD-10-CM | POA: Diagnosis not present

## 2015-01-08 DIAGNOSIS — Z792 Long term (current) use of antibiotics: Secondary | ICD-10-CM | POA: Diagnosis not present

## 2015-01-08 DIAGNOSIS — Z91040 Latex allergy status: Secondary | ICD-10-CM | POA: Diagnosis not present

## 2015-01-08 DIAGNOSIS — Z7951 Long term (current) use of inhaled steroids: Secondary | ICD-10-CM | POA: Insufficient documentation

## 2015-01-08 DIAGNOSIS — E119 Type 2 diabetes mellitus without complications: Secondary | ICD-10-CM | POA: Diagnosis not present

## 2015-01-08 DIAGNOSIS — K219 Gastro-esophageal reflux disease without esophagitis: Secondary | ICD-10-CM | POA: Diagnosis not present

## 2015-01-08 DIAGNOSIS — Z7952 Long term (current) use of systemic steroids: Secondary | ICD-10-CM | POA: Insufficient documentation

## 2015-01-08 DIAGNOSIS — K5900 Constipation, unspecified: Secondary | ICD-10-CM | POA: Insufficient documentation

## 2015-01-08 DIAGNOSIS — I252 Old myocardial infarction: Secondary | ICD-10-CM | POA: Diagnosis not present

## 2015-01-08 DIAGNOSIS — I10 Essential (primary) hypertension: Secondary | ICD-10-CM | POA: Insufficient documentation

## 2015-01-08 DIAGNOSIS — Z79899 Other long term (current) drug therapy: Secondary | ICD-10-CM | POA: Diagnosis not present

## 2015-01-08 DIAGNOSIS — Z720 Tobacco use: Secondary | ICD-10-CM | POA: Diagnosis not present

## 2015-01-08 DIAGNOSIS — R079 Chest pain, unspecified: Secondary | ICD-10-CM | POA: Diagnosis present

## 2015-01-08 LAB — BASIC METABOLIC PANEL
Anion gap: 9 (ref 5–15)
BUN: 12 mg/dL (ref 6–20)
CALCIUM: 8.8 mg/dL — AB (ref 8.9–10.3)
CO2: 29 mmol/L (ref 22–32)
CREATININE: 0.84 mg/dL (ref 0.61–1.24)
Chloride: 93 mmol/L — ABNORMAL LOW (ref 101–111)
GFR calc Af Amer: >60 mL/min (ref >60)
GFR calc non Af Amer: >60 mL/min (ref >60)
Glucose, Bld: 80 mg/dL (ref 65–99)
POTASSIUM: 3.5 mmol/L (ref 3.5–5.1)
SODIUM: 131 mmol/L — AB (ref 135–145)

## 2015-01-08 LAB — I-STAT TROPONIN, ED: TROPONIN I, POC: 0 ng/mL (ref 0.00–0.08)

## 2015-01-08 LAB — CBC
HCT: 36.9 % — ABNORMAL LOW (ref 39.0–52.0)
HEMOGLOBIN: 12 g/dL — AB (ref 13.0–17.0)
MCH: 28.7 pg (ref 26.0–34.0)
MCHC: 32.5 g/dL (ref 30.0–36.0)
MCV: 88.3 fL (ref 78.0–100.0)
Platelets: 352 10*3/uL (ref 150–400)
RBC: 4.18 MIL/uL — ABNORMAL LOW (ref 4.22–5.81)
RDW: 15.7 % — ABNORMAL HIGH (ref 11.5–15.5)
WBC: 11.5 10*3/uL — ABNORMAL HIGH (ref 4.0–10.5)

## 2015-01-08 LAB — BRAIN NATRIURETIC PEPTIDE: B Natriuretic Peptide: 78.9 pg/mL (ref 0.0–100.0)

## 2015-01-08 MED ORDER — METHYLPREDNISOLONE SODIUM SUCC 125 MG IJ SOLR
125.0000 mg | Freq: Once | INTRAMUSCULAR | Status: AC
  Administered 2015-01-08: 125 mg via INTRAVENOUS
  Filled 2015-01-08: qty 2

## 2015-01-08 MED ORDER — IPRATROPIUM BROMIDE 0.02 % IN SOLN
0.5000 mg | Freq: Once | RESPIRATORY_TRACT | Status: AC
  Administered 2015-01-08: 0.5 mg via RESPIRATORY_TRACT
  Filled 2015-01-08: qty 2.5

## 2015-01-08 MED ORDER — PREDNISONE 20 MG PO TABS: 40.0000 mg | ORAL_TABLET | Freq: Every day | ORAL | Status: DC

## 2015-01-08 MED ORDER — ALBUTEROL (5 MG/ML) CONTINUOUS INHALATION SOLN
10.0000 mg/h | INHALATION_SOLUTION | Freq: Once | RESPIRATORY_TRACT | Status: AC
  Administered 2015-01-08: 10 mg/h via RESPIRATORY_TRACT
  Filled 2015-01-08: qty 20

## 2015-01-08 NOTE — ED Provider Notes (Signed)
CSN: [id number]     Arrival date & time 01/08/15  [id number] History   First MD Initiated Contact with Patient 01/08/15 0845     Chief Complaint  Patient presents with  . Chest Pain     (Consider location/radiation/quality/duration/timing/severity/associated sxs/prior Treatment) HPI Comments: Patient here complaining of persistent left-sided chest pain characterized as sharp 5 hours. He is also noted increased cough with wheezing. He has been dyspneic but denies any fever or chills. Symptoms are worse with certain movements as well as coughing. Notes some increased leg edema. Has a history of COPD and is not on home oxygen. Symptoms have persisted and better with rest. No treatment for this use prior to arrival  Patient is a 59 y.o. male presenting with chest pain. The history is provided by the patient and the spouse.  Chest Pain   Past Medical History  Diagnosis Date  . Hypertension   . Diabetes mellitus   . Acid reflux   . Asthma   . Constipation   . Myocardial infarction   . Peripheral vascular disease   . COPD (chronic obstructive pulmonary disease)    Past Surgical History  Procedure Laterality Date  . Ankle surgery    . Iliac artery stent    . Abdominal aortagram N/A 05/10/2013    Procedure: ABDOMINAL Ronny FlurryAORTAGRAM;  Surgeon: Chuck Hinthristopher S Dickson, MD;  [facility];  Service: Cardiovascular;  Laterality: N/A;  . Lower extremity angiogram Bilateral 05/10/2013    Procedure: LOWER EXTREMITY ANGIOGRAM;  Surgeon: Chuck Hinthristopher S Dickson, MD;  [facility];  Service: Cardiovascular;  Laterality: Bilateral;   Family History  Problem Relation Age of Onset  . Diabetes Mother   . Diabetes Sister   . Cancer Brother   . Diabetes Brother    History  Substance Use Topics  . Smoking status: Current Every Day Smoker -- 3.00 packs/day for 45 years    Types: Cigarettes  . Smokeless tobacco: Never Used     Comment: pt states that he is now taking chantix and he is slowly  cutting back  . Alcohol Use: No    Review of Systems  Cardiovascular: Positive for chest pain.  All other systems reviewed and are negative.     Allergies  Latex and Chantix  Home Medications   Prior to Admission medications   Medication Sig Start Date End Date Taking? Authorizing Provider  ALPRAZolam Prudy Feeler(XANAX) 0.5 MG tablet Take 0.5 mg by mouth at bedtime as needed. For sleep    Historical Provider, MD  aspirin EC 81 MG tablet Take 81 mg by mouth daily.      Historical Provider, MD  atorvastatin (LIPITOR) 20 MG tablet Take 20 mg by mouth daily.    Historical Provider, MD  cetirizine (ZYRTEC) 10 MG tablet Take 10 mg by mouth at bedtime.    Historical Provider, MD  cyproheptadine (PERIACTIN) 4 MG tablet Take 4 mg by mouth daily.    Historical Provider, MD  dextromethorphan (DELSYM) 30 MG/5ML liquid Take 60 mg by mouth as needed for cough.    Historical Provider, MD  DM-Doxylamine-Acetaminophen (NYQUIL COLD & FLU PO) Take 30 mLs by mouth daily as needed (for cough).     Historical Provider, MD  docusate sodium (COLACE) 100 MG capsule Take 300 mg by mouth at bedtime.    Historical Provider, MD  doxycycline (VIBRAMYCIN) 100 MG capsule Take 1 capsule (100 mg total) by mouth 2 (two) times daily. One po bid x 7 days 03/22/14  Purvis Sheffield, MD  esomeprazole (NEXIUM) 40 MG capsule Take 1 capsule (40 mg total) by mouth daily before breakfast. 10/05/12   Richarda Overlie, MD  fluocinonide cream (LIDEX) 0.05 % Apply 1 application topically daily as needed (for sores).    Historical Provider, MD  fluticasone (FLOVENT HFA) 110 MCG/ACT inhaler Inhale 2 puffs into the lungs 2 (two) times daily.    Historical Provider, MD  glipiZIDE (GLUCOTROL XL) 5 MG 24 hr tablet Take 2 tablets (10 mg total) by mouth daily. 10/05/12   Richarda Overlie, MD  hydrocortisone (ANUSOL-HC) 2.5 % rectal cream Place 1 application rectally 2 (two) times daily. 10/05/12   Richarda Overlie, MD  ibuprofen (ADVIL,MOTRIN) 200 MG tablet Take 600  mg by mouth every 6 (six) hours as needed. For pain.    Historical Provider, MD  lidocaine (XYLOCAINE) 5 % ointment Apply 1 application topically as needed (for hemmorhoids).  11/18/12   Historical Provider, MD  losartan-hydrochlorothiazide (HYZAAR) 100-12.5 MG per tablet Take 1 tablet by mouth daily.    Historical Provider, MD  Menthol, Topical Analgesic, 5 % PADS Apply 1 patch topically at bedtime as needed (for leg pain).     Historical Provider, MD  nitroGLYCERIN (NITROGLYN) 2 % ointment Place 1 inch onto the skin every 6 (six) hours. 10/05/12   Richarda Overlie, MD  omeprazole (PRILOSEC) 20 MG capsule Take 20 mg by mouth daily.    Historical Provider, MD  pioglitazone (ACTOS) 30 MG tablet Take 30 mg by mouth daily.    Historical Provider, MD  Saxagliptin-Metformin (KOMBIGLYZE XR) 12-998 MG TB24 Take 1 tablet by mouth daily. 10/05/12   Richarda Overlie, MD  tiotropium (SPIRIVA) 18 MCG inhalation capsule Place 18 mcg into inhaler and inhale daily.    Historical Provider, MD  traMADol (ULTRAM) 50 MG tablet Take 50 mg by mouth every 6 (six) hours as needed. For pain    Historical Provider, MD  Umeclidinium-Vilanterol (ANORO ELLIPTA) 62.5-25 MCG/INH AEPB Inhale into the lungs as needed.    Historical Provider, MD  varenicline (CHANTIX) 0.5 MG tablet Take 0.5 mg by mouth daily.    Historical Provider, MD   BP 118/72 mmHg  Pulse 119  Temp(Src) 98.3 F (36.8 C) (Oral)  Ht 6' (1.829 m)  Wt 237 lb (107.502 kg)  BMI 32.14 kg/m2  SpO2 97% Physical Exam  Constitutional: He is oriented to person, place, and time. He appears well-developed and well-nourished.  Non-toxic appearance. No distress.  HENT:  Head: Normocephalic and atraumatic.  Eyes: Conjunctivae, EOM and lids are normal. Pupils are equal, round, and reactive to light.  Neck: Normal range of motion. Neck supple. No tracheal deviation present. No thyroid mass present.  Cardiovascular: Regular rhythm and normal heart sounds.  Tachycardia present.  Exam  reveals no gallop.   No murmur heard. Pulmonary/Chest: Effort normal. No stridor. No respiratory distress. He has decreased breath sounds. He has wheezes. He has no rhonchi. He has no rales.    Abdominal: Soft. Normal appearance and bowel sounds are normal. He exhibits no distension. There is no tenderness. There is no rebound and no CVA tenderness.  Musculoskeletal: Normal range of motion. He exhibits no edema or tenderness.  Neurological: He is alert and oriented to person, place, and time. He has normal strength. No cranial nerve deficit or sensory deficit. GCS eye subscore is 4. GCS verbal subscore is 5. GCS motor subscore is 6.  Skin: Skin is warm and dry. No abrasion and no rash noted.  Psychiatric: He  has a normal mood and affect. His speech is normal and behavior is normal.  Nursing note and vitals reviewed.   ED Course  Procedures (including critical care time) Labs Review Labs Reviewed  CBC  BASIC METABOLIC PANEL  BRAIN NATRIURETIC PEPTIDE  I-STAT TROPOININ, ED    Imaging Review No results found.   EKG Interpretation   Date/Time:  Sunday January 08 2015 08:44:52 EDT Ventricular Rate:  98 PR Interval:  145 QRS Duration: 88 QT Interval:  366 QTC Calculation: 467 R Axis:   47 Text Interpretation:  Sinus rhythm Baseline wander in lead(s) I Confirmed  by Amrit Erck  MD, Julane Crock ([id number]) on 01/08/2015 9:04:55 AM      MDM   Final diagnoses:  None     Patient given Solu-Medrol and albuterol treatment here. Continues to wheeze and look dyspneic. However he was maintaining appropriately. Does not appear to be anoxic. Offered admission but she has refused. Patient has decided to leave AMA. This was discussed in front of his family member   Lorre Nick, MD 01/08/15 [phone]

## 2015-01-08 NOTE — ED Notes (Addendum)
Pt with L sided chest pain since yesterday.  Hx of MI and copd.  Audible wheezing. Sats of 88% on RA.

## 2015-01-08 NOTE — ED Notes (Signed)
Pt was sleeping and maintained an O2 sat of 72% with good waveform.  RN woke up pt and placed on 2L .  Pt O2 sat is now maintaining at 92%.  Freida BusmanAllen MD made aware.

## 2015-01-08 NOTE — Discharge Instructions (Signed)
You were offered admission for treatment of your chest pain as well as breathing issue. You have decided to not stay and be admitted. The risk of sudden death or permanent disability has been explained to you in the front of your family member. Please take the prednisone as directed You are always welcome to return here if you change your mind and please follow-up with your doctor Chronic Obstructive Pulmonary Disease Chronic obstructive pulmonary disease (COPD) is a common lung condition in which airflow from the lungs is limited. COPD is a general term that can be used to describe many different lung problems that limit airflow, including both chronic bronchitis and emphysema. If you have COPD, your lung function will probably never return to normal, but there are measures you can take to improve lung function and make yourself feel better.  CAUSES   Smoking (common).   Exposure to secondhand smoke.   Genetic problems.  Chronic inflammatory lung diseases or recurrent infections. SYMPTOMS   Shortness of breath, especially with physical activity.   Deep, persistent (chronic) cough with a large amount of thick mucus.   Wheezing.   Rapid breaths (tachypnea).   Gray or bluish discoloration (cyanosis) of the skin, especially in fingers, toes, or lips.   Fatigue.   Weight loss.   Frequent infections or episodes when breathing symptoms become much worse (exacerbations).   Chest tightness. DIAGNOSIS  Your health care provider will take a medical history and perform a physical examination to make the initial diagnosis. Additional tests for COPD may include:   Lung (pulmonary) function tests.  Chest X-ray.  CT scan.  Blood tests. TREATMENT  Treatment available to help you feel better when you have COPD includes:   Inhaler and nebulizer medicines. These help manage the symptoms of COPD and make your breathing more comfortable.  Supplemental oxygen. Supplemental oxygen  is only helpful if you have a low oxygen level in your blood.   Exercise and physical activity. These are beneficial for nearly all people with COPD. Some people may also benefit from a pulmonary rehabilitation program. HOME CARE INSTRUCTIONS   Take all medicines (inhaled or pills) as directed by your health care provider.  Avoid over-the-counter medicines or cough syrups that dry up your airway (such as antihistamines) and slow down the elimination of secretions unless instructed otherwise by your health care provider.   If you are a smoker, the most important thing that you can do is stop smoking. Continuing to smoke will cause further lung damage and breathing trouble. Ask your health care provider for help with quitting smoking. He or she can direct you to community resources or hospitals that provide support.  Avoid exposure to irritants such as smoke, chemicals, and fumes that aggravate your breathing.  Use oxygen therapy and pulmonary rehabilitation if directed by your health care provider. If you require home oxygen therapy, ask your health care provider whether you should purchase a pulse oximeter to measure your oxygen level at home.   Avoid contact with individuals who have a contagious illness.  Avoid extreme temperature and humidity changes.  Eat healthy foods. Eating smaller, more frequent meals and resting before meals may help you maintain your strength.  Stay active, but balance activity with periods of rest. Exercise and physical activity will help you maintain your ability to do things you want to do.  Preventing infection and hospitalization is very important when you have COPD. Make sure to receive all the vaccines your health care provider recommends,  especially the pneumococcal and influenza vaccines. Ask your health care provider whether you need a pneumonia vaccine.  Learn and use relaxation techniques to manage stress.  Learn and use controlled breathing  techniques as directed by your health care provider. Controlled breathing techniques include:   Pursed lip breathing. Start by breathing in (inhaling) through your nose for 1 second. Then, purse your lips as if you were going to whistle and breathe out (exhale) through the pursed lips for 2 seconds.   Diaphragmatic breathing. Start by putting one hand on your abdomen just above your waist. Inhale slowly through your nose. The hand on your abdomen should move out. Then purse your lips and exhale slowly. You should be able to feel the hand on your abdomen moving in as you exhale.   Learn and use controlled coughing to clear mucus from your lungs. Controlled coughing is a series of short, progressive coughs. The steps of controlled coughing are:  1. Lean your head slightly forward.  2. Breathe in deeply using diaphragmatic breathing.  3. Try to hold your breath for 3 seconds.  4. Keep your mouth slightly open while coughing twice.  5. Spit any mucus out into a tissue.  6. Rest and repeat the steps once or twice as needed. SEEK MEDICAL CARE IF:   You are coughing up more mucus than usual.   There is a change in the color or thickness of your mucus.   Your breathing is more labored than usual.   Your breathing is faster than usual.  SEEK IMMEDIATE MEDICAL CARE IF:   You have shortness of breath while you are resting.   You have shortness of breath that prevents you from:  Being able to talk.   Performing your usual physical activities.   You have chest pain lasting longer than 5 minutes.   Your skin color is more cyanotic than usual.  You measure low oxygen saturations for longer than 5 minutes with a pulse oximeter. MAKE SURE YOU:   Understand these instructions.  Will watch your condition.  Will get help right away if you are not doing well or get worse. Document Released: 05/01/2005 Document Revised: 12/06/2013 Document Reviewed: 03/18/2013 [facility]  Patient Information 2015 [employer], [place]. This information is not intended to replace advice given to you by your health care provider. Make sure you discuss any questions you have with your health care provider.

## 2015-01-20 ENCOUNTER — Ambulatory Visit (INDEPENDENT_AMBULATORY_CARE_PROVIDER_SITE_OTHER): Payer: Medicare HMO | Admitting: Podiatry

## 2015-01-20 ENCOUNTER — Encounter: Payer: Self-pay | Admitting: Podiatry

## 2015-01-20 DIAGNOSIS — E1140 Type 2 diabetes mellitus with diabetic neuropathy, unspecified: Secondary | ICD-10-CM

## 2015-01-20 DIAGNOSIS — M79675 Pain in left toe(s): Secondary | ICD-10-CM

## 2015-01-20 DIAGNOSIS — B351 Tinea unguium: Secondary | ICD-10-CM

## 2015-01-20 DIAGNOSIS — M79674 Pain in right toe(s): Secondary | ICD-10-CM

## 2015-01-20 DIAGNOSIS — M79676 Pain in unspecified toe(s): Secondary | ICD-10-CM

## 2015-01-20 DIAGNOSIS — E1151 Type 2 diabetes mellitus with diabetic peripheral angiopathy without gangrene: Secondary | ICD-10-CM | POA: Diagnosis not present

## 2015-01-21 NOTE — Progress Notes (Signed)
Patient ID: Alejandro Jones, male   DOB: 10/20/1955, 59 y.o.   MRN: [id number] HPI  Complaint:  Visit Type: Patient returns to my office for continued preventative foot care services. Complaint: Patient states" my nails have grown long and thick and become painful to walk and wear shoes" Patient has been diagnosed with DM with vascular and neuropathy. . He presents for preventative foot care services. No changes to ROS  Podiatric Exam: Vascular: dorsalis pedis and posterior tibial pulses are negative. Capillary return is immediate. Temperature gradient is negative. Skin turgor WNL,   Sensorium: Absent Semmes Weinstein monofilament test.   Nail Exam: Pt has thick disfigured discolored nails with subungual debris noted bilateral entire nail hallux through fifth toenails Ulcer Exam: There is no evidence of ulcer or pre-ulcerative changes or infection. Orthopedic Exam: Muscle tone and strength are WNL. No limitations in general ROM. No crepitus or effusions noted. Foot type and digits show no abnormalities. Bony prominences are unremarkable. Skin: No Porokeratosis. No infection or ulcers  Diagnosis:  Tinea unguium, Pain in right toe, pain in left toes  Treatment & Plan Procedures and Treatment: Consent by patient was obtained for treatment procedures. The patient understood the discussion of treatment and procedures well. All questions were answered thoroughly reviewed. Debridement of mycotic and hypertrophic toenails, 1 through 5 bilateral and clearing of subungual debris. No ulceration, no infection noted.  Return Visit-Office Procedure: Patient instructed to return to the office for a follow up visit  4months for continued evaluation and treatment.  

## 2015-04-27 ENCOUNTER — Encounter (HOSPITAL_BASED_OUTPATIENT_CLINIC_OR_DEPARTMENT_OTHER): Payer: Medicare Other | Attending: Internal Medicine

## 2015-04-27 DIAGNOSIS — S2190XA Unspecified open wound of unspecified part of thorax, initial encounter: Secondary | ICD-10-CM | POA: Diagnosis not present

## 2015-04-27 DIAGNOSIS — E1140 Type 2 diabetes mellitus with diabetic neuropathy, unspecified: Secondary | ICD-10-CM | POA: Insufficient documentation

## 2015-04-27 DIAGNOSIS — S21209A Unspecified open wound of unspecified back wall of thorax without penetration into thoracic cavity, initial encounter: Secondary | ICD-10-CM | POA: Insufficient documentation

## 2015-04-27 DIAGNOSIS — X58XXXA Exposure to other specified factors, initial encounter: Secondary | ICD-10-CM | POA: Diagnosis not present

## 2015-04-27 DIAGNOSIS — S41102A Unspecified open wound of left upper arm, initial encounter: Secondary | ICD-10-CM | POA: Diagnosis not present

## 2015-04-27 DIAGNOSIS — S31000A Unspecified open wound of lower back and pelvis without penetration into retroperitoneum, initial encounter: Secondary | ICD-10-CM | POA: Insufficient documentation

## 2015-04-27 DIAGNOSIS — M1990 Unspecified osteoarthritis, unspecified site: Secondary | ICD-10-CM | POA: Diagnosis not present

## 2015-04-27 DIAGNOSIS — L298 Other pruritus: Secondary | ICD-10-CM | POA: Diagnosis not present

## 2015-04-27 DIAGNOSIS — G4730 Sleep apnea, unspecified: Secondary | ICD-10-CM | POA: Insufficient documentation

## 2015-04-27 DIAGNOSIS — J45909 Unspecified asthma, uncomplicated: Secondary | ICD-10-CM | POA: Diagnosis not present

## 2015-04-27 DIAGNOSIS — S31819A Unspecified open wound of right buttock, initial encounter: Secondary | ICD-10-CM | POA: Insufficient documentation

## 2015-04-27 DIAGNOSIS — S0100XA Unspecified open wound of scalp, initial encounter: Secondary | ICD-10-CM | POA: Insufficient documentation

## 2015-04-27 DIAGNOSIS — I10 Essential (primary) hypertension: Secondary | ICD-10-CM | POA: Insufficient documentation

## 2015-04-27 DIAGNOSIS — F17200 Nicotine dependence, unspecified, uncomplicated: Secondary | ICD-10-CM | POA: Insufficient documentation

## 2015-04-27 DIAGNOSIS — I499 Cardiac arrhythmia, unspecified: Secondary | ICD-10-CM | POA: Insufficient documentation

## 2015-04-27 DIAGNOSIS — E1151 Type 2 diabetes mellitus with diabetic peripheral angiopathy without gangrene: Secondary | ICD-10-CM | POA: Diagnosis not present

## 2015-04-27 DIAGNOSIS — J449 Chronic obstructive pulmonary disease, unspecified: Secondary | ICD-10-CM | POA: Diagnosis not present

## 2015-04-27 DIAGNOSIS — S1190XA Unspecified open wound of unspecified part of neck, initial encounter: Secondary | ICD-10-CM | POA: Diagnosis not present

## 2015-05-01 ENCOUNTER — Emergency Department (HOSPITAL_COMMUNITY)
Admission: EM | Admit: 2015-05-01 | Discharge: 2015-05-01 | Disposition: A | Discharge status: Home / Self Care | Payer: Medicare Other | Attending: Emergency Medicine | Admitting: Emergency Medicine

## 2015-05-01 ENCOUNTER — Emergency Department (HOSPITAL_COMMUNITY): Payer: Medicare Other

## 2015-05-01 ENCOUNTER — Encounter (HOSPITAL_COMMUNITY): Payer: Self-pay | Admitting: Cardiology

## 2015-05-01 DIAGNOSIS — Z79899 Other long term (current) drug therapy: Secondary | ICD-10-CM | POA: Diagnosis not present

## 2015-05-01 DIAGNOSIS — J441 Chronic obstructive pulmonary disease with (acute) exacerbation: Secondary | ICD-10-CM | POA: Diagnosis not present

## 2015-05-01 DIAGNOSIS — E119 Type 2 diabetes mellitus without complications: Secondary | ICD-10-CM | POA: Insufficient documentation

## 2015-05-01 DIAGNOSIS — R079 Chest pain, unspecified: Secondary | ICD-10-CM

## 2015-05-01 DIAGNOSIS — I10 Essential (primary) hypertension: Secondary | ICD-10-CM | POA: Diagnosis not present

## 2015-05-01 DIAGNOSIS — Z720 Tobacco use: Secondary | ICD-10-CM | POA: Insufficient documentation

## 2015-05-01 DIAGNOSIS — Z91040 Latex allergy status: Secondary | ICD-10-CM | POA: Insufficient documentation

## 2015-05-01 DIAGNOSIS — Z7982 Long term (current) use of aspirin: Secondary | ICD-10-CM | POA: Insufficient documentation

## 2015-05-01 LAB — CBC
HEMATOCRIT: 38.9 % — AB (ref 39.0–52.0)
HEMOGLOBIN: 12.4 g/dL — AB (ref 13.0–17.0)
MCH: 28.2 pg (ref 26.0–34.0)
MCHC: 31.9 g/dL (ref 30.0–36.0)
MCV: 88.4 fL (ref 78.0–100.0)
Platelets: 351 10*3/uL (ref 150–400)
RBC: 4.4 MIL/uL (ref 4.22–5.81)
RDW: 16.5 % — ABNORMAL HIGH (ref 11.5–15.5)
WBC: 8.4 10*3/uL (ref 4.0–10.5)

## 2015-05-01 LAB — BASIC METABOLIC PANEL
ANION GAP: 9 (ref 5–15)
BUN: 7 mg/dL (ref 6–20)
CALCIUM: 9.1 mg/dL (ref 8.9–10.3)
CHLORIDE: 95 mmol/L — AB (ref 101–111)
CO2: 32 mmol/L (ref 22–32)
Creatinine, Ser: 1.09 mg/dL (ref 0.61–1.24)
GFR calc non Af Amer: >60 mL/min (ref >60)
GLUCOSE: 142 mg/dL — AB (ref 65–99)
POTASSIUM: 3.5 mmol/L (ref 3.5–5.1)
Sodium: 136 mmol/L (ref 135–145)

## 2015-05-01 LAB — TROPONIN I
TROPONIN I: 0.04 ng/mL — AB (ref <0.031)
Troponin I: <0.03 ng/mL (ref <0.031)

## 2015-05-01 MED ORDER — LORAZEPAM 1 MG PO TABS
1.0000 mg | ORAL_TABLET | Freq: Once | ORAL | Status: AC
  Administered 2015-05-01: 1 mg via ORAL
  Filled 2015-05-01: qty 1

## 2015-05-01 NOTE — ED Notes (Signed)
Pt chest reports chest pain, in left chest, lateral aspect.  Pt reports pain was an 8 out of 10.  Pt denies current chest pain.  Pt reports headache, 10 out of 10 on front/left.  Pt denies SOB.  Pt reports had a similar chest pain last week, and had perfuse sweating with that occurrence.  Pt denies sweating with current episode.  Pt reports multiple falls in the past 6 months.  Pt reports he is usually able to catch himself, but has fallen to the floor.  Pt denies fall to floor in the past month, but has had several gait, loss of balance episodes.  Pt denies nausea with chest pain, but nausea yesterday.  Pt reports a fever of 100.3 at approximately 8pm last night.  Pt reports having a cold for approximately a week.  Pt is coughing clear thin fluid.

## 2015-05-01 NOTE — ED Notes (Signed)
Pt educated on high fall risk, and need to stay in bed.  Pt refused to comply at this time.  Bed rail down per pt request.  Pt educated on risk.  Family member at bedside. Family member encouraged to keep pt safe.

## 2015-05-01 NOTE — ED Notes (Signed)
Pt becoming agitated about length of stay in ED.  Pts spouse reports pt to be a chronic smoker; Pt's wife stated: "he usually has a cigarette every 15 minutes, he's been several hours without one now." Pt threatening to leave if results not back soon.  MD Adriana Simas notified.

## 2015-05-01 NOTE — ED Provider Notes (Signed)
CSN: [id number]     Arrival date & time 05/01/15  1439 History   First MD Initiated Contact with Patient 05/01/15 1555     Chief Complaint  Patient presents with  . Chest Pain  . Shortness of Breath     (Consider location/radiation/quality/duration/timing/severity/associated sxs/prior Treatment) HPI..... Patient reports history of left-sided chest pain for unknown length of time with associated dyspnea and diaphoresis. He is symptom-free at this time. He has multiple health problems including hypertension, diabetes, cigarette smoking, peripheral plaster disease, COPD. He goes to [facility] for primary care. Status post stents 2 in his left lower extremity. He has been treated recently for a skin infection on his back and scalp with "antibiotic and prednisone".  Past Medical History  Diagnosis Date  . Hypertension   . Diabetes mellitus   . Acid reflux   . Asthma   . Constipation   . Peripheral vascular disease   . COPD (chronic obstructive pulmonary disease)    Past Surgical History  Procedure Laterality Date  . Ankle surgery    . Iliac artery stent    . Abdominal aortagram N/A 05/10/2013    Procedure: ABDOMINAL Ronny Flurry;  Surgeon: Chuck Hint, MD;  Location: [facility] CATH LAB;  Service: Cardiovascular;  Laterality: N/A;  . Lower extremity angiogram Bilateral 05/10/2013    Procedure: LOWER EXTREMITY ANGIOGRAM;  Surgeon: Chuck Hint, MD;  Location: [facility] CATH LAB;  Service: Cardiovascular;  Laterality: Bilateral;   Family History  Problem Relation Age of Onset  . Diabetes Mother   . Diabetes Sister   . Cancer Brother   . Diabetes Brother    Social History  Substance Use Topics  . Smoking status: Current Every Day Smoker -- 3.00 packs/day for 45 years    Types: Cigarettes  . Smokeless tobacco: Never Used     Comment: pt states that he is now taking chantix and he is slowly cutting back  . Alcohol Use: No    Review of Systems  All other systems reviewed and  are negative.     Allergies  Chantix and Latex  Home Medications   Prior to Admission medications   Medication Sig Start Date End Date Taking? Authorizing Provider  acetaminophen (TYLENOL) 500 MG tablet Take 1,000 mg by mouth every 6 (six) hours as needed for moderate pain.   Yes Historical Provider, MD  albuterol (PROVENTIL) (2.5 MG/3ML) 0.083% nebulizer solution Take 2.5 mg by nebulization every 6 (six) hours as needed for wheezing or shortness of breath.   Yes Historical Provider, MD  aspirin EC 81 MG tablet Take 81 mg by mouth daily.     Yes Historical Provider, MD  atorvastatin (LIPITOR) 20 MG tablet Take 20 mg by mouth daily at 6 PM.    Yes Historical Provider, MD  dextromethorphan (DELSYM) 30 MG/5ML liquid Take 60 mg by mouth as needed for cough.   Yes Historical Provider, MD  DM-Doxylamine-Acetaminophen (NYQUIL COLD & FLU PO) Take 30 mLs by mouth daily as needed (for cough).    Yes Historical Provider, MD  Fluticasone Furoate-Vilanterol (BREO ELLIPTA) 100-25 MCG/INH AEPB Inhale 1 puff into the lungs daily as needed (for shortness of breath).   Yes Historical Provider, MD  glipiZIDE (GLUCOTROL XL) 5 MG 24 hr tablet Take 2 tablets (10 mg total) by mouth daily. 10/05/12  Yes Richarda Overlie, MD  glipiZIDE (GLUCOTROL) 10 MG tablet Take 10 mg by mouth daily before breakfast.   Yes Historical Provider, MD  ibuprofen (ADVIL,MOTRIN) 200 MG tablet  Take 600 mg by mouth every 6 (six) hours as needed. For pain.   Yes Historical Provider, MD  lidocaine (LIDODERM) 5 % Place 2 patches onto the skin daily. Remove & Discard patch within 12 hours or as directed by MD   Yes Historical Provider, MD  losartan-hydrochlorothiazide (HYZAAR) 100-12.5 MG per tablet Take 1 tablet by mouth daily.   Yes Historical Provider, MD  Menthol, Topical Analgesic, 5 % PADS Apply 1 patch topically at bedtime as needed (for leg pain).    Yes Historical Provider, MD  omeprazole (PRILOSEC) 20 MG capsule Take 20 mg by mouth daily.    Yes Historical Provider, MD  pioglitazone (ACTOS) 30 MG tablet Take 30 mg by mouth daily.   Yes Historical Provider, MD  Saxagliptin-Metformin (KOMBIGLYZE XR) 2.12-998 MG TB24 Take 1 tablet by mouth 2 (two) times daily.   Yes Historical Provider, MD  sennosides-docusate sodium (SENOKOT-S) 8.6-50 MG tablet Take 3 tablets by mouth at bedtime.   Yes Historical Provider, MD  traMADol (ULTRAM) 50 MG tablet Take 50 mg by mouth every 6 (six) hours as needed. For pain   Yes Historical Provider, MD  doxycycline (VIBRAMYCIN) 100 MG capsule Take 1 capsule (100 mg total) by mouth 2 (two) times daily. One po bid x 7 days 03/22/14   Purvis Sheffield, MD  esomeprazole (NEXIUM) 40 MG capsule Take 1 capsule (40 mg total) by mouth daily before breakfast. 10/05/12   Richarda Overlie, MD  hydrocortisone (ANUSOL-HC) 2.5 % rectal cream Place 1 application rectally 2 (two) times daily. 10/05/12   Richarda Overlie, MD  predniSONE (DELTASONE) 20 MG tablet Take 2 tablets (40 mg total) by mouth daily with breakfast. 01/08/15   Lorre Nick, MD  Saxagliptin-Metformin (KOMBIGLYZE XR) 12-998 MG TB24 Take 1 tablet by mouth daily. 10/05/12   Richarda Overlie, MD   BP 81/58 mmHg  Pulse 63  Temp(Src) 97.8 F (36.6 C) (Oral)  Resp 23  Wt 236 lb (107.049 kg)  SpO2 96% Physical Exam  Constitutional: He is oriented to person, place, and time. He appears well-developed and well-nourished.  HENT:  Head: Normocephalic and atraumatic.  Eyes: Conjunctivae and EOM are normal. Pupils are equal, round, and reactive to light.  Neck: Normal range of motion. Neck supple.  Cardiovascular: Normal rate and regular rhythm.   Pulmonary/Chest: Effort normal.  Bilateral expiratory wheeze  Abdominal: Soft. Bowel sounds are normal.  Musculoskeletal: Normal range of motion.  Neurological: He is alert and oriented to person, place, and time.  Skin:  Multiple sites of inflamed appearing skin on his back and upper scalp  Psychiatric: He has a normal mood and  affect. His behavior is normal.  Nursing note and vitals reviewed.   ED Course  Procedures (including critical care time) Labs Review Labs Reviewed  BASIC METABOLIC PANEL - Abnormal; Notable for the following:    Chloride 95 (*)    Glucose, Bld 142 (*)    All other components within normal limits  CBC - Abnormal; Notable for the following:    Hemoglobin 12.4 (*)    HCT 38.9 (*)    RDW 16.5 (*)    All other components within normal limits  TROPONIN I - Abnormal; Notable for the following:    Troponin I 0.04 (*)    All other components within normal limits  TROPONIN I    Imaging Review Dg Chest 2 View  05/01/2015   CLINICAL DATA:  LEFT-sided chest pain. Onset of symptoms today. Hypertension.  EXAM: CHEST  2 VIEW  COMPARISON:  Multiple priors dating back to 10/05/2010.  FINDINGS: Calcified granulomata present in the RIGHT upper lobe, unchanged since 2013. Cardiopericardial silhouette within normal limits. Mediastinal contours normal. Trachea midline. No airspace disease or effusion. Aortic arch atherosclerosis. Lower thoracic spondylosis.  IMPRESSION: No active cardiopulmonary disease.   Electronically Signed   By: Andreas Newport M.D.   On: 05/01/2015 15:26   I have personally reviewed and evaluated these images and lab results as part of my medical decision-making.   EKG Interpretation   Date/Time:  Monday May 01 2015 14:44:26 EDT Ventricular Rate:  116 PR Interval:  122 QRS Duration: 80 QT Interval:  352 QTC Calculation: 489 R Axis:   71 Text Interpretation:  Sinus tachycardia with Premature atrial complexes  Possible Anterior infarct , age undetermined Abnormal ECG Confirmed by  COOK  MD, BRIAN ([id number]) on 05/01/2015 4:36:47 PM      MDM   Final diagnoses:  Chest pain, unspecified chest pain type    Patient has a host of cardiovascular risk factors. Second troponin is negative. EKG shows no acute changes. Pulse has normalized. One low blood pressure reading.  Patient is adamant that he does not want to be admitted to the hospital for any reason. He understands to get primary care follow-up and return if worse. I discussed the severity of his health condition, the high likelihood of coronary artery disease, and the fact that he needs to stop smoking.    Donnetta Hutching, MD 05/01/15 [phone]

## 2015-05-01 NOTE — ED Notes (Signed)
Phlebotomy at the bedside  

## 2015-05-01 NOTE — ED Notes (Signed)
Pt reports chest pain that started a couple of days ago. Reports recently placed on Predinsone and antibiotics but is still feeling SOB.

## 2015-05-01 NOTE — ED Notes (Signed)
MD at the bedside. Patient refused IV placement and admission at this time.

## 2015-05-01 NOTE — ED Notes (Signed)
Pt stable, ambulatory, states understanding of discharge instructions 

## 2015-05-01 NOTE — Discharge Instructions (Signed)
Second heart chemical tests showed no evidence of a heart attack. You must stop smoking or your will continue to have severe heart disease. Follow-up your primary care doctor.

## 2015-05-07 DIAGNOSIS — R0600 Dyspnea, unspecified: Secondary | ICD-10-CM | POA: Insufficient documentation

## 2015-05-07 DIAGNOSIS — M549 Dorsalgia, unspecified: Secondary | ICD-10-CM | POA: Insufficient documentation

## 2015-05-19 ENCOUNTER — Ambulatory Visit: Payer: Medicare HMO | Admitting: Podiatry

## 2015-05-25 ENCOUNTER — Ambulatory Visit (INDEPENDENT_AMBULATORY_CARE_PROVIDER_SITE_OTHER): Payer: Medicare Other | Admitting: Podiatry

## 2015-05-25 ENCOUNTER — Encounter: Payer: Self-pay | Admitting: Podiatry

## 2015-05-25 DIAGNOSIS — M79676 Pain in unspecified toe(s): Secondary | ICD-10-CM

## 2015-05-25 DIAGNOSIS — E1151 Type 2 diabetes mellitus with diabetic peripheral angiopathy without gangrene: Secondary | ICD-10-CM

## 2015-05-25 DIAGNOSIS — B351 Tinea unguium: Secondary | ICD-10-CM

## 2015-05-25 DIAGNOSIS — M79675 Pain in left toe(s): Secondary | ICD-10-CM

## 2015-05-25 DIAGNOSIS — M79674 Pain in right toe(s): Secondary | ICD-10-CM

## 2015-05-25 DIAGNOSIS — E1140 Type 2 diabetes mellitus with diabetic neuropathy, unspecified: Secondary | ICD-10-CM

## 2015-05-25 NOTE — Progress Notes (Signed)
Patient ID: Alejandro Jones, male   DOB: 03/18/1956, 59 y.o.   MRN: [id number] HPI  Complaint:  Visit Type: Patient returns to my office for continued preventative foot care services. Complaint: Patient states" my nails have grown long and thick and become painful to walk and wear shoes" Patient has been diagnosed with DM with vascular and neuropathy. Marland Kitchen. He presents for preventative foot care services. No changes to ROS  Podiatric Exam: Vascular: dorsalis pedis and posterior tibial pulses are negative. Capillary return is immediate. Temperature gradient is negative. Skin turgor WNL,   Sensorium: Absent Semmes Weinstein monofilament test.   Nail Exam: Pt has thick disfigured discolored nails with subungual debris noted bilateral entire nail hallux through fifth toenails Ulcer Exam: There is no evidence of ulcer or pre-ulcerative changes or infection. Orthopedic Exam: Muscle tone and strength are WNL. No limitations in general ROM. No crepitus or effusions noted. Foot type and digits show no abnormalities. Bony prominences are unremarkable. Skin: No Porokeratosis. No infection or ulcers  Diagnosis:  Tinea unguium, Pain in right toe, pain in left toes  Treatment & Plan Procedures and Treatment: Consent by patient was obtained for treatment procedures. The patient understood the discussion of treatment and procedures well. All questions were answered thoroughly reviewed. Debridement of mycotic and hypertrophic toenails, 1 through 5 bilateral and clearing of subungual debris. No ulceration, no infection noted.  Return Visit-Office Procedure: Patient instructed to return to the office for a follow up visit  4months for continued evaluation and treatment.

## 2015-05-29 DIAGNOSIS — F17210 Nicotine dependence, cigarettes, uncomplicated: Secondary | ICD-10-CM | POA: Insufficient documentation

## 2015-05-29 DIAGNOSIS — E785 Hyperlipidemia, unspecified: Secondary | ICD-10-CM | POA: Insufficient documentation

## 2015-06-01 ENCOUNTER — Encounter (HOSPITAL_BASED_OUTPATIENT_CLINIC_OR_DEPARTMENT_OTHER): Payer: Medicare Other | Attending: Internal Medicine

## 2015-06-01 ENCOUNTER — Emergency Department (HOSPITAL_COMMUNITY)
Admission: EM | Admit: 2015-06-01 | Discharge: 2015-06-01 | Disposition: A | Discharge status: Home / Self Care | Payer: Medicare Other | Attending: Emergency Medicine | Admitting: Emergency Medicine

## 2015-06-01 ENCOUNTER — Emergency Department (HOSPITAL_COMMUNITY): Payer: Medicare Other

## 2015-06-01 ENCOUNTER — Encounter (HOSPITAL_COMMUNITY): Payer: Self-pay | Admitting: *Deleted

## 2015-06-01 DIAGNOSIS — K219 Gastro-esophageal reflux disease without esophagitis: Secondary | ICD-10-CM | POA: Diagnosis not present

## 2015-06-01 DIAGNOSIS — Z720 Tobacco use: Secondary | ICD-10-CM | POA: Diagnosis not present

## 2015-06-01 DIAGNOSIS — I10 Essential (primary) hypertension: Secondary | ICD-10-CM | POA: Diagnosis not present

## 2015-06-01 DIAGNOSIS — J449 Chronic obstructive pulmonary disease, unspecified: Secondary | ICD-10-CM | POA: Insufficient documentation

## 2015-06-01 DIAGNOSIS — S31000D Unspecified open wound of lower back and pelvis without penetration into retroperitoneum, subsequent encounter: Secondary | ICD-10-CM | POA: Insufficient documentation

## 2015-06-01 DIAGNOSIS — S41102D Unspecified open wound of left upper arm, subsequent encounter: Secondary | ICD-10-CM | POA: Insufficient documentation

## 2015-06-01 DIAGNOSIS — X58XXXD Exposure to other specified factors, subsequent encounter: Secondary | ICD-10-CM | POA: Diagnosis not present

## 2015-06-01 DIAGNOSIS — Z7982 Long term (current) use of aspirin: Secondary | ICD-10-CM | POA: Insufficient documentation

## 2015-06-01 DIAGNOSIS — Z91040 Latex allergy status: Secondary | ICD-10-CM | POA: Diagnosis not present

## 2015-06-01 DIAGNOSIS — G4730 Sleep apnea, unspecified: Secondary | ICD-10-CM | POA: Diagnosis not present

## 2015-06-01 DIAGNOSIS — Z79899 Other long term (current) drug therapy: Secondary | ICD-10-CM | POA: Diagnosis not present

## 2015-06-01 DIAGNOSIS — J45909 Unspecified asthma, uncomplicated: Secondary | ICD-10-CM | POA: Insufficient documentation

## 2015-06-01 DIAGNOSIS — S1190XD Unspecified open wound of unspecified part of neck, subsequent encounter: Secondary | ICD-10-CM | POA: Diagnosis not present

## 2015-06-01 DIAGNOSIS — I739 Peripheral vascular disease, unspecified: Secondary | ICD-10-CM | POA: Insufficient documentation

## 2015-06-01 DIAGNOSIS — E1140 Type 2 diabetes mellitus with diabetic neuropathy, unspecified: Secondary | ICD-10-CM | POA: Diagnosis not present

## 2015-06-01 DIAGNOSIS — S2190XD Unspecified open wound of unspecified part of thorax, subsequent encounter: Secondary | ICD-10-CM | POA: Insufficient documentation

## 2015-06-01 DIAGNOSIS — S0100XD Unspecified open wound of scalp, subsequent encounter: Secondary | ICD-10-CM | POA: Insufficient documentation

## 2015-06-01 DIAGNOSIS — E119 Type 2 diabetes mellitus without complications: Secondary | ICD-10-CM | POA: Diagnosis not present

## 2015-06-01 DIAGNOSIS — S31819D Unspecified open wound of right buttock, subsequent encounter: Secondary | ICD-10-CM | POA: Diagnosis not present

## 2015-06-01 DIAGNOSIS — R509 Fever, unspecified: Secondary | ICD-10-CM | POA: Diagnosis present

## 2015-06-01 DIAGNOSIS — T148XXA Other injury of unspecified body region, initial encounter: Secondary | ICD-10-CM

## 2015-06-01 DIAGNOSIS — M1990 Unspecified osteoarthritis, unspecified site: Secondary | ICD-10-CM | POA: Insufficient documentation

## 2015-06-01 DIAGNOSIS — L298 Other pruritus: Secondary | ICD-10-CM | POA: Insufficient documentation

## 2015-06-01 DIAGNOSIS — L089 Local infection of the skin and subcutaneous tissue, unspecified: Secondary | ICD-10-CM

## 2015-06-01 LAB — COMPREHENSIVE METABOLIC PANEL
ALBUMIN: 2.9 g/dL — AB (ref 3.5–5.0)
ALK PHOS: 56 U/L (ref 38–126)
ALT: 13 U/L — ABNORMAL LOW (ref 17–63)
ANION GAP: 11 (ref 5–15)
AST: 19 U/L (ref 15–41)
BUN: 11 mg/dL (ref 6–20)
CO2: 29 mmol/L (ref 22–32)
Calcium: 9.5 mg/dL (ref 8.9–10.3)
Chloride: 93 mmol/L — ABNORMAL LOW (ref 101–111)
Creatinine, Ser: 1 mg/dL (ref 0.61–1.24)
GFR calc Af Amer: >60 mL/min (ref >60)
GFR calc non Af Amer: >60 mL/min (ref >60)
GLUCOSE: 163 mg/dL — AB (ref 65–99)
POTASSIUM: 3.4 mmol/L — AB (ref 3.5–5.1)
SODIUM: 133 mmol/L — AB (ref 135–145)
Total Bilirubin: 0.5 mg/dL (ref 0.3–1.2)
Total Protein: 6.7 g/dL (ref 6.5–8.1)

## 2015-06-01 LAB — URINALYSIS, ROUTINE W REFLEX MICROSCOPIC
Bilirubin Urine: NEGATIVE
GLUCOSE, UA: NEGATIVE mg/dL
HGB URINE DIPSTICK: NEGATIVE
KETONES UR: NEGATIVE mg/dL
LEUKOCYTES UA: NEGATIVE
Nitrite: NEGATIVE
PH: 5 (ref 5.0–8.0)
PROTEIN: NEGATIVE mg/dL
Specific Gravity, Urine: 1.02 (ref 1.005–1.030)
Urobilinogen, UA: 1 mg/dL (ref 0.0–1.0)

## 2015-06-01 LAB — CBC
HEMATOCRIT: 37.1 % — AB (ref 39.0–52.0)
HEMOGLOBIN: 11.9 g/dL — AB (ref 13.0–17.0)
MCH: 28 pg (ref 26.0–34.0)
MCHC: 32.1 g/dL (ref 30.0–36.0)
MCV: 87.3 fL (ref 78.0–100.0)
Platelets: 471 10*3/uL — ABNORMAL HIGH (ref 150–400)
RBC: 4.25 MIL/uL (ref 4.22–5.81)
RDW: 16.9 % — ABNORMAL HIGH (ref 11.5–15.5)
WBC: 13.4 10*3/uL — ABNORMAL HIGH (ref 4.0–10.5)

## 2015-06-01 LAB — LIPASE, BLOOD: Lipase: 20 U/L (ref 11–51)

## 2015-06-01 MED ORDER — CEPHALEXIN 500 MG PO CAPS: 500.0000 mg | ORAL_CAPSULE | Freq: Four times a day (QID) | ORAL | Status: DC

## 2015-06-01 MED ORDER — ACETAMINOPHEN 325 MG PO TABS
650.0000 mg | ORAL_TABLET | Freq: Once | ORAL | Status: AC
  Administered 2015-06-01: 650 mg via ORAL
  Filled 2015-06-01: qty 2

## 2015-06-01 MED ORDER — SODIUM CHLORIDE 0.9 % IV BOLUS (SEPSIS)
1000.0000 mL | Freq: Once | INTRAVENOUS | Status: AC
  Administered 2015-06-01: 1000 mL via INTRAVENOUS

## 2015-06-01 MED ORDER — CEFAZOLIN SODIUM-DEXTROSE 2-3 GM-% IV SOLR
2.0000 g | Freq: Once | INTRAVENOUS | Status: AC
  Administered 2015-06-01: 2 g via INTRAVENOUS
  Filled 2015-06-01: qty 50

## 2015-06-01 MED ORDER — ACETAMINOPHEN 325 MG PO TABS
325.0000 mg | ORAL_TABLET | Freq: Once | ORAL | Status: DC
  Filled 2015-06-01: qty 1

## 2015-06-01 NOTE — ED Notes (Signed)
Pt to xray

## 2015-06-01 NOTE — ED Notes (Signed)
Pt requesting to go, EDP notified

## 2015-06-01 NOTE — ED Notes (Signed)
Pt refused rectal temp, explained importance and accuracy of having rectal vs oral temp, pt still refusing

## 2015-06-01 NOTE — ED Notes (Signed)
Pt c/o nausea, chills, and fever or 100.5 at home. Pt was seen wound care center earlier today for sores head, back, arms, neck, bottom. Pt states he itches all over.

## 2015-06-01 NOTE — ED Notes (Signed)
Pt back from x-ray.

## 2015-06-01 NOTE — Discharge Instructions (Signed)
Follow-up with your primary care physician and wound care center.  New prescription Keflex. Take as directed until completed.  Return to ER if any new or worsening symptoms.

## 2015-06-05 ENCOUNTER — Emergency Department (HOSPITAL_COMMUNITY): Payer: Medicare Other

## 2015-06-05 ENCOUNTER — Emergency Department (HOSPITAL_COMMUNITY)
Admission: EM | Admit: 2015-06-05 | Discharge: 2015-06-05 | Disposition: A | Discharge status: Home / Self Care | Payer: Medicare Other | Attending: Emergency Medicine | Admitting: Emergency Medicine

## 2015-06-05 ENCOUNTER — Encounter (HOSPITAL_COMMUNITY): Payer: Self-pay | Admitting: Emergency Medicine

## 2015-06-05 DIAGNOSIS — F17210 Nicotine dependence, cigarettes, uncomplicated: Secondary | ICD-10-CM | POA: Insufficient documentation

## 2015-06-05 DIAGNOSIS — Z792 Long term (current) use of antibiotics: Secondary | ICD-10-CM | POA: Diagnosis not present

## 2015-06-05 DIAGNOSIS — R079 Chest pain, unspecified: Secondary | ICD-10-CM

## 2015-06-05 DIAGNOSIS — Z91040 Latex allergy status: Secondary | ICD-10-CM | POA: Insufficient documentation

## 2015-06-05 DIAGNOSIS — J441 Chronic obstructive pulmonary disease with (acute) exacerbation: Secondary | ICD-10-CM | POA: Insufficient documentation

## 2015-06-05 DIAGNOSIS — K5900 Constipation, unspecified: Secondary | ICD-10-CM | POA: Diagnosis not present

## 2015-06-05 DIAGNOSIS — E119 Type 2 diabetes mellitus without complications: Secondary | ICD-10-CM | POA: Diagnosis not present

## 2015-06-05 DIAGNOSIS — R000 Tachycardia, unspecified: Secondary | ICD-10-CM | POA: Diagnosis not present

## 2015-06-05 DIAGNOSIS — Z7982 Long term (current) use of aspirin: Secondary | ICD-10-CM | POA: Diagnosis not present

## 2015-06-05 DIAGNOSIS — I10 Essential (primary) hypertension: Secondary | ICD-10-CM | POA: Diagnosis not present

## 2015-06-05 DIAGNOSIS — K219 Gastro-esophageal reflux disease without esophagitis: Secondary | ICD-10-CM | POA: Diagnosis not present

## 2015-06-05 DIAGNOSIS — Z79899 Other long term (current) drug therapy: Secondary | ICD-10-CM | POA: Diagnosis not present

## 2015-06-05 LAB — BASIC METABOLIC PANEL
ANION GAP: 9 (ref 5–15)
BUN: 6 mg/dL (ref 6–20)
CALCIUM: 9.5 mg/dL (ref 8.9–10.3)
CO2: 32 mmol/L (ref 22–32)
Chloride: 100 mmol/L — ABNORMAL LOW (ref 101–111)
Creatinine, Ser: 0.97 mg/dL (ref 0.61–1.24)
GFR calc Af Amer: >60 mL/min (ref >60)
GFR calc non Af Amer: >60 mL/min (ref >60)
GLUCOSE: 185 mg/dL — AB (ref 65–99)
Potassium: 3.7 mmol/L (ref 3.5–5.1)
Sodium: 141 mmol/L (ref 135–145)

## 2015-06-05 LAB — I-STAT TROPONIN, ED
TROPONIN I, POC: 0 ng/mL (ref 0.00–0.08)
Troponin i, poc: 0 ng/mL (ref 0.00–0.08)

## 2015-06-05 LAB — CBC
HCT: 37.1 % — ABNORMAL LOW (ref 39.0–52.0)
HEMOGLOBIN: 11.6 g/dL — AB (ref 13.0–17.0)
MCH: 28.1 pg (ref 26.0–34.0)
MCHC: 31.3 g/dL (ref 30.0–36.0)
MCV: 89.8 fL (ref 78.0–100.0)
PLATELETS: 524 10*3/uL — AB (ref 150–400)
RBC: 4.13 MIL/uL — ABNORMAL LOW (ref 4.22–5.81)
RDW: 17 % — ABNORMAL HIGH (ref 11.5–15.5)
WBC: 8.4 10*3/uL (ref 4.0–10.5)

## 2015-06-05 MED ORDER — IPRATROPIUM BROMIDE 0.02 % IN SOLN
0.5000 mg | Freq: Once | RESPIRATORY_TRACT | Status: AC
  Administered 2015-06-05: 0.5 mg via RESPIRATORY_TRACT
  Filled 2015-06-05: qty 2.5

## 2015-06-05 MED ORDER — NITROGLYCERIN 0.4 MG SL SUBL: 0.4000 mg | SUBLINGUAL_TABLET | SUBLINGUAL | Status: DC | PRN

## 2015-06-05 MED ORDER — ACETAMINOPHEN 500 MG PO TABS
1000.0000 mg | ORAL_TABLET | Freq: Once | ORAL | Status: AC
  Administered 2015-06-05: 1000 mg via ORAL
  Filled 2015-06-05: qty 2

## 2015-06-05 MED ORDER — ALBUTEROL (5 MG/ML) CONTINUOUS INHALATION SOLN
10.0000 mg/h | INHALATION_SOLUTION | Freq: Once | RESPIRATORY_TRACT | Status: AC
  Administered 2015-06-05: 10 mg/h via RESPIRATORY_TRACT
  Filled 2015-06-05: qty 20

## 2015-06-05 MED ORDER — IOHEXOL 350 MG/ML SOLN
100.0000 mL | Freq: Once | INTRAVENOUS | Status: AC | PRN
  Administered 2015-06-05: 100 mL via INTRAVENOUS

## 2015-06-05 NOTE — ED Provider Notes (Signed)
CSN: [id number]     Arrival date & time 06/05/15  1521 History   First MD Initiated Contact with Patient 06/05/15 1545     Chief Complaint  Patient presents with  . Chest Pain     (Consider location/radiation/quality/duration/timing/severity/associated sxs/prior Treatment) HPI Comments: 59 year old male here with chest pain. He states left-sided sharp chest pain for months that has been intermittent, it has worsened over the past several days and is more intense but is the same character of sharp. Last 2-3 minutes. These episodes have been getting longer since it started. He denies any fevers, but he has been coughing. He has a history of COPD but doesn't take his medications like he is supposed to. He denies any nausea or vomiting. He was recently treated with Keflex and antibiotics for sores on his neck in the ED.  Patient is a 59 y.o. male presenting with chest pain. The history is provided by the patient.  Chest Pain Pain location:  L chest Pain quality: sharp   Pain radiates to:  Does not radiate Pain radiates to the back: no   Pain severity:  Moderate Onset quality:  Gradual Timing:  Intermittent Progression:  Unchanged Chronicity:  New Context: at rest   Relieved by:  Nothing Worsened by:  Nothing tried Associated symptoms: no altered mental status, no back pain, no cough, no dizziness, no fever, no nausea, no near-syncope, no shortness of breath and not vomiting     Past Medical History  Diagnosis Date  . Hypertension   . Diabetes mellitus   . Acid reflux   . Asthma   . Constipation   . Peripheral vascular disease (HCC)   . COPD (chronic obstructive pulmonary disease) [facility])    Past Surgical History  Procedure Laterality Date  . Ankle surgery    . Iliac artery stent    . Abdominal aortagram N/A 05/10/2013    Procedure: ABDOMINAL Ronny Flurry;  Surgeon: Chuck Hint, MD;  Location: [facility] CATH LAB;  Service: Cardiovascular;  Laterality: N/A;  . Lower extremity  angiogram Bilateral 05/10/2013    Procedure: LOWER EXTREMITY ANGIOGRAM;  Surgeon: Chuck Hint, MD;  Location: [facility] CATH LAB;  Service: Cardiovascular;  Laterality: Bilateral;   Family History  Problem Relation Age of Onset  . Diabetes Mother   . Diabetes Sister   . Cancer Brother   . Diabetes Brother    Social History  Substance Use Topics  . Smoking status: Current Every Day Smoker -- 3.00 packs/day for 45 years    Types: Cigarettes  . Smokeless tobacco: Never Used     Comment: pt states that he is now taking chantix and he is slowly cutting back  . Alcohol Use: No    Review of Systems  Constitutional: Negative for fever.  Respiratory: Negative for cough and shortness of breath.   Cardiovascular: Positive for chest pain. Negative for near-syncope.  Gastrointestinal: Negative for nausea and vomiting.  Musculoskeletal: Negative for back pain.  Neurological: Negative for dizziness.  All other systems reviewed and are negative.     Allergies  Chantix and Latex  Home Medications   Prior to Admission medications   Medication Sig Start Date End Date Taking? Authorizing Provider  cephALEXin (KEFLEX) 500 MG capsule Take 1 capsule (500 mg total) by mouth 4 (four) times daily. 06/01/15  Yes Rolland Porter, MD  acetaminophen (TYLENOL) 500 MG tablet Take 1,000 mg by mouth every 6 (six) hours as needed for moderate pain.    Historical  Provider, MD  albuterol (PROVENTIL) (2.5 MG/3ML) 0.083% nebulizer solution Take 2.5 mg by nebulization every 6 (six) hours as needed for wheezing or shortness of breath.    Historical Provider, MD  aspirin EC 81 MG tablet Take 81 mg by mouth daily.      Historical Provider, MD  atorvastatin (LIPITOR) 20 MG tablet Take 20 mg by mouth daily at 6 PM.     Historical Provider, MD  dextromethorphan (DELSYM) 30 MG/5ML liquid Take 60 mg by mouth as needed for cough.    Historical Provider, MD  doxycycline (VIBRAMYCIN) 100 MG capsule Take 1 capsule (100 mg  total) by mouth 2 (two) times daily. One po bid x 7 days Patient not taking: Reported on 06/01/2015 03/22/14   Purvis Sheffield, MD  esomeprazole (NEXIUM) 40 MG capsule Take 1 capsule (40 mg total) by mouth daily before breakfast. Patient not taking: Reported on 06/01/2015 10/05/12   Richarda Overlie, MD  Fluticasone Furoate-Vilanterol (BREO ELLIPTA) 100-25 MCG/INH AEPB Inhale 1 puff into the lungs daily as needed (for shortness of breath).    Historical Provider, MD  glipiZIDE (GLUCOTROL XL) 5 MG 24 hr tablet Take 2 tablets (10 mg total) by mouth daily. Patient not taking: Reported on 06/01/2015 10/05/12   Richarda Overlie, MD  glipiZIDE (GLUCOTROL) 10 MG tablet Take 10 mg by mouth daily before breakfast.    Historical Provider, MD  hydrocortisone (ANUSOL-HC) 2.5 % rectal cream Place 1 application rectally 2 (two) times daily. Patient not taking: Reported on 06/01/2015 10/05/12   Richarda Overlie, MD  ibuprofen (ADVIL,MOTRIN) 200 MG tablet Take 600 mg by mouth every 6 (six) hours as needed. For pain.    Historical Provider, MD  lidocaine (LIDODERM) 5 % Place 2 patches onto the skin daily. Remove & Discard patch within 12 hours or as directed by MD    Historical Provider, MD  losartan-hydrochlorothiazide (HYZAAR) 100-12.5 MG per tablet Take 1 tablet by mouth daily.    Historical Provider, MD  Menthol, Topical Analgesic, 5 % PADS Apply 1 patch topically at bedtime as needed (for leg pain).     Historical Provider, MD  NITROSTAT 0.4 MG SL tablet Place 0.4 mg under the tongue every 5 (five) minutes as needed for chest pain.  05/08/15   Historical Provider, MD  omeprazole (PRILOSEC) 20 MG capsule Take 20 mg by mouth daily.    Historical Provider, MD  pioglitazone (ACTOS) 30 MG tablet Take 30 mg by mouth daily.    Historical Provider, MD  predniSONE (DELTASONE) 20 MG tablet Take 2 tablets (40 mg total) by mouth daily with breakfast. Patient not taking: Reported on 06/01/2015 01/08/15   Lorre Nick, MD   Saxagliptin-Metformin (KOMBIGLYZE XR) 2.12-998 MG TB24 Take 1 tablet by mouth 2 (two) times daily.    Historical Provider, MD  Saxagliptin-Metformin (KOMBIGLYZE XR) 12-998 MG TB24 Take 1 tablet by mouth daily. Patient not taking: Reported on 06/01/2015 10/05/12   Richarda Overlie, MD  sennosides-docusate sodium (SENOKOT-S) 8.6-50 MG tablet Take 3 tablets by mouth at bedtime.    Historical Provider, MD  traMADol (ULTRAM) 50 MG tablet Take 50 mg by mouth every 6 (six) hours as needed. For pain    Historical Provider, MD   BP 110/66 mmHg  Pulse 119  Temp(Src) 98.2 F (36.8 C) (Oral)  Resp 22  Ht 6' (1.829 m)  Wt 225 lb 8 oz (102.286 kg)  BMI 30.58 kg/m2  SpO2 96% Physical Exam  Constitutional: He is oriented to person, place, and  time. He appears well-developed and well-nourished. No distress.  HENT:  Head: Normocephalic and atraumatic.  Mouth/Throat: No oropharyngeal exudate.  Eyes: EOM are normal. Pupils are equal, round, and reactive to light.  Neck: Normal range of motion. Neck supple.  Cardiovascular: Normal rate and regular rhythm.  Exam reveals no friction rub.   No murmur heard. Pulmonary/Chest: Effort normal. No respiratory distress. He has wheezes (diffuse). He has no rales.  Abdominal: He exhibits no distension. There is no tenderness. There is no rebound.  Musculoskeletal: Normal range of motion. He exhibits no edema.  Neurological: He is alert and oriented to person, place, and time.  Skin: No rash noted. He is not diaphoretic.  Nursing note and vitals reviewed.   ED Course  Procedures (including critical care time) Labs Review Labs Reviewed  BASIC METABOLIC PANEL - Abnormal; Notable for the following:    Chloride 100 (*)    Glucose, Bld 185 (*)    All other components within normal limits  CBC - Abnormal; Notable for the following:    RBC 4.13 (*)    Hemoglobin 11.6 (*)    HCT 37.1 (*)    RDW 17.0 (*)    Platelets 524 (*)    All other components within normal  limits  I-STAT TROPOININ, ED    Imaging Review No results found. I have personally reviewed and evaluated these images and lab results as part of my medical decision-making.   EKG Interpretation   Date/Time:  Monday June 05 2015 15:44:07 EDT Ventricular Rate:  124 PR Interval:  124 QRS Duration: 80 QT Interval:  340 QTC Calculation: 488 R Axis:   67 Text Interpretation:  Sinus tachycardia with Premature atrial complexes  with Abberant conduction Otherwise normal ECG No significant change since  last tracing Confirmed by Gwendolyn GrantWALDEN  MD, Brett Soza ([id number]) on 06/05/2015 3:47:17  PM      MDM   Final diagnoses:  Chest pain, unspecified chest pain type    59 year old male here with chest pain. Left sided, sharp, intermittent. Has been present for several months, but has worsened. No fevers, but has been coughing. Here wheezing, tachycardic. Will give breathing treatment.  He states prior doctors have given him nitroglycerin, so I will give him some here. Chest pain doesn't seem cardiac in nature since it is sharp, but will check troponins.  Troponins okay. PET scan normal. Review of records shows patient has been refusing cath at this time. He is belligerent and would like to go home. Discharged to follow-up with his cardiologist.   Elwin MochaBlair Gleen Ripberger, MD 06/06/15 0006

## 2015-06-05 NOTE — ED Notes (Signed)
Pt refusing vital monitoring

## 2015-06-05 NOTE — ED Notes (Signed)
RT made aware of patient's need for breathing treatment

## 2015-06-05 NOTE — ED Notes (Signed)
Pt overheard in room complaining about how he keeps coming to the hospital and no one can figure out what is wrong with him. Dr. Gwendolyn GrantWalden made aware.

## 2015-06-05 NOTE — ED Notes (Signed)
Pt c.o cp that has been ongoing intermittently for "some time". Pain can last for up to 5 mins at times. Pt reports some relief with home nitro.

## 2015-06-05 NOTE — Discharge Instructions (Signed)

## 2015-06-05 NOTE — ED Notes (Signed)
Patient c/o of change in chest pain. Repeat EKG done

## 2015-06-07 NOTE — ED Provider Notes (Signed)
CSN: [id number]     Arrival date & time 06/01/15  1746 History   First MD Initiated Contact with Patient 06/01/15 1838     Chief Complaint  Patient presents with  . Fever  . Chills  . Nausea      HPI  Emergency evaluation multiple wounds. Follows at the wound care center. States that  he had some itching for several months. Several wounds or open skin. His been treated in a bike several times and improved.  Past Medical History  Diagnosis Date  . Hypertension   . Diabetes mellitus   . Acid reflux   . Asthma   . Constipation   . Peripheral vascular disease (HCC)   . COPD (chronic obstructive pulmonary disease) [facility](HCC)    Past Surgical History  Procedure Laterality Date  . Ankle surgery    . Iliac artery stent    . Abdominal aortagram N/A 05/10/2013    Procedure: ABDOMINAL Ronny FlurryAORTAGRAM;  Surgeon: Chuck Hinthristopher S Dickson, MD;  [facility];  Service: Cardiovascular;  Laterality: N/A;  . Lower extremity angiogram Bilateral 05/10/2013    Procedure: LOWER EXTREMITY ANGIOGRAM;  Surgeon: Chuck Hinthristopher S Dickson, MD;  [facility];  Service: Cardiovascular;  Laterality: Bilateral;   Family History  Problem Relation Age of Onset  . Diabetes Mother   . Diabetes Sister   . Cancer Brother   . Diabetes Brother    Social History  Substance Use Topics  . Smoking status: Current Every Day Smoker -- 3.00 packs/day for 45 years    Types: Cigarettes  . Smokeless tobacco: Never Used     Comment: pt states that he is now taking chantix and he is slowly cutting back  . Alcohol Use: No    Review of Systems  Constitutional: Negative for fever, chills, diaphoresis, appetite change and fatigue.  HENT: Negative for mouth sores, sore throat and trouble swallowing.   Eyes: Negative for visual disturbance.  Respiratory: Negative for cough, chest tightness, shortness of breath and wheezing.   Cardiovascular: Negative for chest pain.  Gastrointestinal: Negative for nausea, vomiting,  abdominal pain, diarrhea and abdominal distention.  Endocrine: Negative for polydipsia, polyphagia and polyuria.  Genitourinary: Negative for dysuria, frequency and hematuria.  Musculoskeletal: Negative for gait problem.  Skin: Positive for wound. Negative for color change, pallor and rash.  Neurological: Negative for dizziness, syncope, light-headedness and headaches.  Hematological: Does not bruise/bleed easily.  Psychiatric/Behavioral: Negative for behavioral problems and confusion.      Allergies  Chantix and Latex  Home Medications   Prior to Admission medications   Medication Sig Start Date End Date Taking? Authorizing Provider  acetaminophen (TYLENOL) 500 MG tablet Take 1,000 mg by mouth every 6 (six) hours as needed for moderate pain.   Yes Historical Provider, MD  albuterol (PROVENTIL) (2.5 MG/3ML) 0.083% nebulizer solution Take 2.5 mg by nebulization every 6 (six) hours as needed for wheezing or shortness of breath.   Yes Historical Provider, MD  aspirin EC 81 MG tablet Take 81 mg by mouth daily.     Yes Historical Provider, MD  atorvastatin (LIPITOR) 20 MG tablet Take 20 mg by mouth daily at 6 PM.    Yes Historical Provider, MD  dextromethorphan (DELSYM) 30 MG/5ML liquid Take 60 mg by mouth as needed for cough.   Yes Historical Provider, MD  Fluticasone Furoate-Vilanterol (BREO ELLIPTA) 100-25 MCG/INH AEPB Inhale 1 puff into the lungs daily as needed (for shortness of breath).   Yes Historical Provider, MD  glipiZIDE (GLUCOTROL) 10 MG tablet Take 10 mg by mouth daily before breakfast.   Yes Historical Provider, MD  ibuprofen (ADVIL,MOTRIN) 200 MG tablet Take 600 mg by mouth every 6 (six) hours as needed. For pain.   Yes Historical Provider, MD  lidocaine (LIDODERM) 5 % Place 2 patches onto the skin daily. Remove & Discard patch within 12 hours or as directed by MD   Yes Historical Provider, MD  losartan-hydrochlorothiazide (HYZAAR) 100-12.5 MG per tablet Take 1 tablet by mouth  daily.   Yes Historical Provider, MD  Menthol, Topical Analgesic, 5 % PADS Apply 1 patch topically at bedtime as needed (for leg pain).    Yes Historical Provider, MD  NITROSTAT 0.4 MG SL tablet Place 0.4 mg under the tongue every 5 (five) minutes as needed for chest pain.  05/08/15  Yes Historical Provider, MD  omeprazole (PRILOSEC) 20 MG capsule Take 20 mg by mouth daily.   Yes Historical Provider, MD  pioglitazone (ACTOS) 30 MG tablet Take 30 mg by mouth daily.   Yes Historical Provider, MD  Saxagliptin-Metformin (KOMBIGLYZE XR) 2.12-998 MG TB24 Take 1 tablet by mouth 2 (two) times daily.   Yes Historical Provider, MD  sennosides-docusate sodium (SENOKOT-S) 8.6-50 MG tablet Take 3 tablets by mouth at bedtime.   Yes Historical Provider, MD  traMADol (ULTRAM) 50 MG tablet Take 50 mg by mouth every 6 (six) hours as needed. For pain   Yes Historical Provider, MD  cephALEXin (KEFLEX) 500 MG capsule Take 1 capsule (500 mg total) by mouth 4 (four) times daily. 06/01/15   Rolland Porter, MD  doxycycline (VIBRAMYCIN) 100 MG capsule Take 1 capsule (100 mg total) by mouth 2 (two) times daily. One po bid x 7 days Patient not taking: Reported on 06/01/2015 03/22/14   Purvis Sheffield, MD  esomeprazole (NEXIUM) 40 MG capsule Take 1 capsule (40 mg total) by mouth daily before breakfast. Patient not taking: Reported on 06/01/2015 10/05/12   Richarda Overlie, MD  glipiZIDE (GLUCOTROL XL) 5 MG 24 hr tablet Take 2 tablets (10 mg total) by mouth daily. Patient not taking: Reported on 06/01/2015 10/05/12   Richarda Overlie, MD  hydrocortisone (ANUSOL-HC) 2.5 % rectal cream Place 1 application rectally 2 (two) times daily. Patient not taking: Reported on 06/01/2015 10/05/12   Richarda Overlie, MD  lidocaine (LMX) 4 % cream Apply 1 application topically as needed (FOR NECK/HEAD WOUND).    Historical Provider, MD  OVER THE COUNTER MEDICATION Apply 1 application topically daily as needed (WOUND CARE-THERA HONEY).    Historical Provider, MD   predniSONE (DELTASONE) 20 MG tablet Take 2 tablets (40 mg total) by mouth daily with breakfast. Patient not taking: Reported on 06/01/2015 01/08/15   Lorre Nick, MD  Saxagliptin-Metformin (KOMBIGLYZE XR) 12-998 MG TB24 Take 1 tablet by mouth daily. Patient not taking: Reported on 06/01/2015 10/05/12   Richarda Overlie, MD  triamcinolone cream (KENALOG) 0.1 % Apply 1 application topically daily as needed (FOR ITCHING).    Historical Provider, MD   BP 107/67 mmHg  Pulse 109  Temp(Src) 99.5 F (37.5 C) (Oral)  Resp 16  SpO2 98% Physical Exam  Constitutional: He is oriented to person, place, and time. He appears well-developed and well-nourished. No distress.  HENT:  Head: Normocephalic.  Eyes: Conjunctivae are normal. Pupils are equal, round, and reactive to light. No scleral icterus.  Neck: Normal range of motion. Neck supple. No thyromegaly present.  Cardiovascular: Normal rate and regular rhythm.  Exam reveals no gallop and no  friction rub.   No murmur heard. Pulmonary/Chest: Effort normal and breath sounds normal. No respiratory distress. He has no wheezes. He has no rales.  Abdominal: Soft. Bowel sounds are normal. He exhibits no distension. There is no tenderness. There is no rebound.  Musculoskeletal: Normal range of motion.  Neurological: He is alert and oriented to person, place, and time.  Skin: Skin is warm and dry. No rash noted.  Multiple areas of dressed skin wounds on the back, buttock, upper arms, leg. No obvious aortic changes.  Psychiatric: He has a normal mood and affect. His behavior is normal.    ED Course  Procedures (including critical care time) Labs Review Labs Reviewed  COMPREHENSIVE METABOLIC PANEL - Abnormal; Notable for the following:    Sodium 133 (*)    Potassium 3.4 (*)    Chloride 93 (*)    Glucose, Bld 163 (*)    Albumin 2.9 (*)    ALT 13 (*)    All other components within normal limits  CBC - Abnormal; Notable for the following:    WBC 13.4 (*)     Hemoglobin 11.9 (*)    HCT 37.1 (*)    RDW 16.9 (*)    Platelets 471 (*)    All other components within normal limits  LIPASE, BLOOD  URINALYSIS, ROUTINE W REFLEX MICROSCOPIC (NOT AT [facility])    Imaging Review Ct Angio Chest Pe W/cm &/or Wo Cm  06/05/2015  CLINICAL DATA:  Sharp chest pain and tachycardia with shortness of breath. EXAM: CT ANGIOGRAPHY CHEST WITH CONTRAST TECHNIQUE: Multidetector CT imaging of the chest was performed using the standard protocol during bolus administration of intravenous contrast. Multiplanar CT image reconstructions and MIPs were obtained to evaluate the vascular anatomy. CONTRAST:  OMNIPAQUE IOHEXOL 350 MG/ML SOLN COMPARISON:  Chest x-ray earlier today. FINDINGS: The pulmonary arteries are very well opacified. There is no evidence of pulmonary embolism. The thoracic aorta is also well opacified and shows normal caliber without evidence of aneurysm or dissection. Proximal great vessels are normally patent. There is evidence of coronary artery disease with calcified plaque noted in a 3 vessel distribution, most prominently in the distribution of the LAD. The heart is at the upper limits of normal in size. No pleural or pericardial fluid is seen. There is calcified pleural plaque along the diaphragmatic surface on the right. Calcified plaque is also noted higher in the right anterior hemithorax. Focus of pleural/subpleural calcification also noted in the left hemithorax. Lungs show no evidence of edema, infiltrate, nodule or pneumothorax. No enlarged lymph nodes seen. No evidence of airway obstruction. Visualized upper abdomen is unremarkable. Bony structures are unremarkable. Review of the MIP images confirms the above findings. IMPRESSION: 1. No evidence of pulmonary embolism. 2. Coronary atherosclerosis with calcified plaque in a 3 vessel distribution. The heart is upper limits of normal in size. 3. Evidence of calcified pleural plaque which may be consistent with  prior asbestos exposure. No significant chronic lung disease. Electronically Signed   By: Irish Lack M.D.   On: 06/05/2015 19:11   I have personally reviewed and evaluated these images and lab results as part of my medical decision-making.   EKG Interpretation None      MDM   Final diagnoses:  Wound infection [facility])      Patient stable for discharge. Per prescription for antibiotics. Continue wound care. Primary care follow-up.  Rolland Porter, MD 06/07/15 [phone]

## 2015-06-28 ENCOUNTER — Ambulatory Visit: Payer: Self-pay | Admitting: Vascular Surgery

## 2015-06-28 ENCOUNTER — Encounter (HOSPITAL_COMMUNITY): Payer: Self-pay

## 2015-07-03 ENCOUNTER — Encounter: Payer: Self-pay | Admitting: Vascular Surgery

## 2015-07-05 ENCOUNTER — Ambulatory Visit (HOSPITAL_COMMUNITY)
Admission: RE | Admit: 2015-07-05 | Discharge: 2015-07-05 | Disposition: A | Discharge status: Home / Self Care | Payer: Medicare Other | Source: Ambulatory Visit | Attending: Vascular Surgery | Admitting: Vascular Surgery

## 2015-07-05 ENCOUNTER — Ambulatory Visit (INDEPENDENT_AMBULATORY_CARE_PROVIDER_SITE_OTHER): Payer: Medicare Other | Admitting: Vascular Surgery

## 2015-07-05 ENCOUNTER — Encounter: Payer: Self-pay | Admitting: Vascular Surgery

## 2015-07-05 VITALS — BP 112/68 | HR 111 | Temp 98.5°F | Ht 72.0 in | Wt 222.8 lb

## 2015-07-05 DIAGNOSIS — I70219 Atherosclerosis of native arteries of extremities with intermittent claudication, unspecified extremity: Secondary | ICD-10-CM | POA: Diagnosis not present

## 2015-07-05 DIAGNOSIS — I739 Peripheral vascular disease, unspecified: Secondary | ICD-10-CM

## 2015-07-05 NOTE — Patient Instructions (Signed)
Please review the tobacco cessation information given to you today. It lists many hints that are useful in your effort to stop smoking. The [place] [facility]essation contact phone # is [phone] These nurses and advisors offer lots of FREE information and aids to help you quit.    The [place] Quit Smoking line #  800-784-8669, they will also assist you with programs designed to help you stop smoking.    Smoking Cessation, Tips for Success If you are ready to quit smoking, congratulations! You have chosen to help yourself be healthier. Cigarettes bring nicotine, tar, carbon monoxide, and other irritants into your body. Your lungs, heart, and blood vessels will be able to work better without these poisons. There are many different ways to quit smoking. Nicotine gum, nicotine patches, a nicotine inhaler, or nicotine nasal spray can help with physical craving. Hypnosis, support groups, and medicines help break the habit of smoking. WHAT THINGS CAN I DO TO MAKE QUITTING EASIER?  Here are some tips to help you quit for good:  Pick a date when you will quit smoking completely. Tell all of your friends and family about your plan to quit on that date.  Do not try to slowly cut down on the number of cigarettes you are smoking. Pick a quit date and quit smoking completely starting on that day.  Throw away all cigarettes.   Clean and remove all ashtrays from your home, work, and car.  On a card, write down your reasons for quitting. Carry the card with you and read it when you get the urge to smoke.  Cleanse your body of nicotine. Drink enough water and fluids to keep your urine clear or pale yellow. Do this after quitting to flush the nicotine from your body.  Learn to predict your moods. Do not let a bad situation be your excuse to have a cigarette. Some situations in your life might tempt you into wanting a cigarette.  Never have "just one" cigarette. It leads to wanting another and another. Remind  yourself of your decision to quit.  Change habits associated with smoking. If you smoked while driving or when feeling stressed, try other activities to replace smoking. Stand up when drinking your coffee. Brush your teeth after eating. Sit in a different chair when you read the paper. Avoid alcohol while trying to quit, and try to drink fewer caffeinated beverages. Alcohol and caffeine may urge you to smoke.  Avoid foods and drinks that can trigger a desire to smoke, such as sugary or spicy foods and alcohol.  Ask people who smoke not to smoke around you.  Have something planned to do right after eating or having a cup of coffee. For example, plan to take a walk or exercise.  Try a relaxation exercise to calm you down and decrease your stress. Remember, you may be tense and nervous for the first 2 weeks after you quit, but this will pass.  Find new activities to keep your hands busy. Play with a pen, coin, or rubber band. Doodle or draw things on paper.  Brush your teeth right after eating. This will help cut down on the craving for the taste of tobacco after meals. You can also try mouthwash.   Use oral substitutes in place of cigarettes. Try using lemon drops, carrots, cinnamon sticks, or chewing gum. Keep them handy so they are available when you have the urge to smoke.  When you have the urge to smoke, try deep breathing.  Designate   your home as a nonsmoking area.  If you are a heavy smoker, ask your health care provider about a prescription for nicotine chewing gum. It can ease your withdrawal from nicotine.  Reward yourself. Set aside the cigarette money you save and buy yourself something nice.  Look for support from others. Join a support group or smoking cessation program. Ask someone at home or at work to help you with your plan to quit smoking.  Always ask yourself, "Do I need this cigarette or is this just a reflex?" Tell yourself, "Today, I choose not to smoke," or "I do  not want to smoke." You are reminding yourself of your decision to quit.  Do not replace cigarette smoking with electronic cigarettes (commonly called e-cigarettes). The safety of e-cigarettes is unknown, and some may contain harmful chemicals.  If you relapse, do not give up! Plan ahead and think about what you will do the next time you get the urge to smoke. HOW WILL I FEEL WHEN I QUIT SMOKING? You may have symptoms of withdrawal because your body is used to nicotine (the addictive substance in cigarettes). You may crave cigarettes, be irritable, feel very hungry, cough often, get headaches, or have difficulty concentrating. The withdrawal symptoms are only temporary. They are strongest when you first quit but will go away within 10-14 days. When withdrawal symptoms occur, stay in control. Think about your reasons for quitting. Remind yourself that these are signs that your body is healing and getting used to being without cigarettes. Remember that withdrawal symptoms are easier to treat than the major diseases that smoking can cause.  Even after the withdrawal is over, expect periodic urges to smoke. However, these cravings are generally short lived and will go away whether you smoke or not. Do not smoke! WHAT RESOURCES ARE AVAILABLE TO HELP ME QUIT SMOKING? Your health care provider can direct you to community resources or hospitals for support, which may include:  Group support.  Education.  Hypnosis.  Therapy.   This information is not intended to replace advice given to you by your health care provider. Make sure you discuss any questions you have with your health care provider.   Document Released: 04/19/2004 Document Revised: 08/12/2014 Document Reviewed: 01/07/2013 [employer] Interactive Patient Education 2016 [employer]   

## 2015-07-05 NOTE — Progress Notes (Signed)
Vascular and Vein Specialist of [facility]  Patient name: Alejandro Jones MRN: [id number] DOB: [birth year] Sex: male  REASON FOR VISIT: Follow up of peripheral vascular disease  HPI: Alejandro Jones is a 59 y.o. male who I last saw on 06/22/2014 for follow up of his peripheral vascular disease. He had undergone a previous left common iliac artery stent in [place]. He underwent an arteriogram in 2014 which showed a patent right common iliac artery and a chronic left common iliac artery occlusion. When I saw him, he was complaining of bilateral calf claudication. His arterial Doppler study at that time showed an ABI of 100% on the right with triphasic Doppler signals in the right foot. On the left side the ABI was 63% which was down slightly compared to his previous study.  He continues to have left lower extremity claudication and a fairly short distance. He experienced pain in his thigh and calf which is brought on by ambulation and relieved with rest. I do not get any clear-cut history of rest pain. He denies any nonhealing wounds on his feet. He has had some wounds on his back.  He has had some chest pain and is being worked up for this. Because of his claudication he could not undergo an exercise test. Because he cannot stop smoking for 24 hours he could not undergo a nuclear stress test. He was being considered for heart catheterization. He also has significant pulmonary issues with a chronic cough and wheezing.  Past Medical History  Diagnosis Date  . Hypertension   . Diabetes mellitus   . Acid reflux   . Asthma   . Constipation   . Peripheral vascular disease (HCC)   . COPD (chronic obstructive pulmonary disease) (HCC)     Family History  Problem Relation Age of Onset  . Diabetes Mother   . Diabetes Sister   . Cancer Brother   . Diabetes Brother     SOCIAL HISTORY: Social History  Substance Use Topics  . Smoking status: Current Every Day Smoker -- 3.00 packs/day for 45  years    Types: Cigarettes  . Smokeless tobacco: Never Used     Comment: pt states that he is now taking chantix and he is slowly cutting back  . Alcohol Use: No    Allergies  Allergen Reactions  . Chantix [Varenicline] Other (See Comments)    Extreme anger and depression   . Latex Itching and Hives    Current Outpatient Prescriptions  Medication Sig Dispense Refill  . acetaminophen (TYLENOL) 500 MG tablet Take 1,000 mg by mouth every 6 (six) hours as needed for moderate pain.    Marland Kitchen albuterol (PROVENTIL) (2.5 MG/3ML) 0.083% nebulizer solution Take 2.5 mg by nebulization every 6 (six) hours as needed for wheezing or shortness of breath.    Marland Kitchen aspirin EC 81 MG tablet Take 81 mg by mouth daily.      Marland Kitchen atorvastatin (LIPITOR) 20 MG tablet Take 20 mg by mouth daily at 6 PM.     . dextromethorphan (DELSYM) 30 MG/5ML liquid Take 60 mg by mouth as needed for cough.    . Fluticasone Furoate-Vilanterol (BREO ELLIPTA) 100-25 MCG/INH AEPB Inhale 1 puff into the lungs daily as needed (for shortness of breath).    Marland Kitchen glipiZIDE (GLUCOTROL) 10 MG tablet Take 10 mg by mouth daily before breakfast.    . ibuprofen (ADVIL,MOTRIN) 200 MG tablet Take 600 mg by mouth every 6 (six) hours as needed. For pain.    Marland Kitchen  lidocaine (LIDODERM) 5 % Place 2 patches onto the skin daily. Remove & Discard patch within 12 hours or as directed by MD    . lidocaine (LMX) 4 % cream Apply 1 application topically as needed (FOR NECK/HEAD WOUND).    Marland Kitchen losartan-hydrochlorothiazide (HYZAAR) 100-12.5 MG per tablet Take 1 tablet by mouth daily.    . Menthol, Topical Analgesic, 5 % PADS Apply 1 patch topically at bedtime as needed (for leg pain).     . methocarbamol (ROBAXIN) 500 MG tablet Take 500 mg by mouth.   0  . NITROSTAT 0.4 MG SL tablet Place 0.4 mg under the tongue every 5 (five) minutes as needed for chest pain.   0  . omeprazole (PRILOSEC) 20 MG capsule Take 20 mg by mouth daily.    Marland Kitchen OVER THE COUNTER MEDICATION Apply 1  application topically daily as needed (WOUND CARE-THERA HONEY).    Marland Kitchen pioglitazone (ACTOS) 30 MG tablet Take 30 mg by mouth daily.    . Saxagliptin-Metformin (KOMBIGLYZE XR) 2.12-998 MG TB24 Take 1 tablet by mouth 2 (two) times daily.    . sennosides-docusate sodium (SENOKOT-S) 8.6-50 MG tablet Take 3 tablets by mouth at bedtime.    . traMADol (ULTRAM) 50 MG tablet Take 50 mg by mouth every 6 (six) hours as needed. For pain    . triamcinolone cream (KENALOG) 0.1 % Apply 1 application topically daily as needed (FOR ITCHING).    . cephALEXin (KEFLEX) 500 MG capsule Take 1 capsule (500 mg total) by mouth 4 (four) times daily. (Patient not taking: Reported on 07/05/2015) 40 capsule 0  . doxycycline (VIBRAMYCIN) 100 MG capsule Take 1 capsule (100 mg total) by mouth 2 (two) times daily. One po bid x 7 days (Patient not taking: Reported on 06/01/2015) 14 capsule 0  . esomeprazole (NEXIUM) 40 MG capsule Take 1 capsule (40 mg total) by mouth daily before breakfast. (Patient not taking: Reported on 06/01/2015) 30 capsule 2  . glipiZIDE (GLUCOTROL XL) 5 MG 24 hr tablet Take 2 tablets (10 mg total) by mouth daily. (Patient not taking: Reported on 06/01/2015) 30 tablet 0  . hydrocortisone (ANUSOL-HC) 2.5 % rectal cream Place 1 application rectally 2 (two) times daily. (Patient not taking: Reported on 06/01/2015) 30 g 3  . predniSONE (DELTASONE) 20 MG tablet Take 2 tablets (40 mg total) by mouth daily with breakfast. (Patient not taking: Reported on 06/01/2015) 10 tablet 0  . Saxagliptin-Metformin (KOMBIGLYZE XR) 12-998 MG TB24 Take 1 tablet by mouth daily. (Patient not taking: Reported on 06/01/2015) 30 tablet 0   No current facility-administered medications for this visit.    REVIEW OF SYSTEMS:   denotes positive finding,  denotes negative finding Cardiac  Comments:  Chest pain or chest pressure: X   Shortness of breath upon exertion: X   Short of breath when lying flat: X   Irregular heart rhythm: X        Vascular    Pain in calf, thigh, or hip brought on by ambulation: X   Pain in feet at night that wakes you up from your sleep:  X   Blood clot in your veins:    Leg swelling:  X       Pulmonary    Oxygen at home:    Productive cough:  X   Wheezing:  X       Neurologic    Sudden weakness in arms or legs:     Sudden numbness in arms or legs:  Sudden onset of difficulty speaking or slurred speech:    Temporary loss of vision in one eye:     Problems with dizziness:         Gastrointestinal    Blood in stool:     Vomited blood:         Genitourinary    Burning when urinating:     Blood in urine:        Psychiatric    Major depression:  X       Hematologic    Bleeding problems:    Problems with blood clotting too easily:        Skin    Rashes or ulcers: X       Constitutional    Fever or chills: X     PHYSICAL EXAM: Filed Vitals:   07/05/15 1356  BP: 112/68  Pulse: 111  Temp: 98.5 F (36.9 C)  TempSrc: Oral  Height: 6' (1.829 m)  Weight: 222 lb 12.8 oz (101.061 kg)  SpO2: 98%    GENERAL: The patient is a well-nourished male, in no acute distress. The vital signs are documented above. CARDIAC: There is a regular rate and rhythm.  VASCULAR: I do not detect carotid bruits On the right side, he has a palpable femoral pulse. The right foot is warm and well-perfused. On the left side, I cannot palpate a femoral pulse. The left foot appears adequately perfused. PULMONARY: There is good air exchange bilaterally. He has some expiratory wheezes bilaterally. ABDOMEN: Soft and non-tender with normal pitched bowel sounds.  MUSCULOSKELETAL: There are no major deformities or cyanosis. NEUROLOGIC: No focal weakness or paresthesias are detected. SKIN: he has some superficial ulcerations on the posterior aspect of his neck. PSYCHIATRIC: The patient has a normal affect.  DATA:   LOWER EXTREMITY ARTERIAL DOPPLER: I have independently interpreted his lower  extremity arterial Doppler study.  On the right side, he has a triphasic signal in the right dorsalis pedis and posterior tibial positions with an ABI of 100%.  On the left side, he has an ABI of 65%. He has monophasic signals in the left posterior tibial and dorsalis pedis positions.  MEDICAL ISSUES:  CHRONIC LEFT COMMON ILIAC ARTERY OCCLUSION: This patient has stable claudication of the left lower extremity related to his chronic left common iliac artery occlusion. We have again discussed the importance of tobacco cessation. In addition I have encouraged him to stay as active as possible. Given his multiple medical issues including his cardiac issues and pulmonary status, I would be very reluctant to consider a right to left femorofemoral bypass graft at this point. I plan on seeing him back in one year with follow up ABIs at that time which I have ordered. He knows to call sooner if he has problems.  Waverly Ferrariickson, Christopher [facility] [place] [facility]: [phone]

## 2015-07-06 ENCOUNTER — Encounter (HOSPITAL_BASED_OUTPATIENT_CLINIC_OR_DEPARTMENT_OTHER): Payer: Medicare Other | Attending: Internal Medicine

## 2015-07-21 ENCOUNTER — Encounter (HOSPITAL_BASED_OUTPATIENT_CLINIC_OR_DEPARTMENT_OTHER): Payer: Self-pay | Admitting: Emergency Medicine

## 2015-07-21 ENCOUNTER — Emergency Department (HOSPITAL_BASED_OUTPATIENT_CLINIC_OR_DEPARTMENT_OTHER)
Admission: EM | Admit: 2015-07-21 | Discharge: 2015-07-22 | Disposition: A | Discharge status: Home / Self Care | Payer: Medicare Other | Attending: Emergency Medicine | Admitting: Emergency Medicine

## 2015-07-21 DIAGNOSIS — K5900 Constipation, unspecified: Secondary | ICD-10-CM | POA: Insufficient documentation

## 2015-07-21 DIAGNOSIS — Z91040 Latex allergy status: Secondary | ICD-10-CM | POA: Diagnosis not present

## 2015-07-21 DIAGNOSIS — K219 Gastro-esophageal reflux disease without esophagitis: Secondary | ICD-10-CM | POA: Diagnosis not present

## 2015-07-21 DIAGNOSIS — L700 Acne vulgaris: Secondary | ICD-10-CM | POA: Insufficient documentation

## 2015-07-21 DIAGNOSIS — E119 Type 2 diabetes mellitus without complications: Secondary | ICD-10-CM | POA: Insufficient documentation

## 2015-07-21 DIAGNOSIS — Z79899 Other long term (current) drug therapy: Secondary | ICD-10-CM | POA: Insufficient documentation

## 2015-07-21 DIAGNOSIS — I10 Essential (primary) hypertension: Secondary | ICD-10-CM | POA: Insufficient documentation

## 2015-07-21 DIAGNOSIS — R21 Rash and other nonspecific skin eruption: Secondary | ICD-10-CM | POA: Diagnosis present

## 2015-07-21 DIAGNOSIS — J441 Chronic obstructive pulmonary disease with (acute) exacerbation: Secondary | ICD-10-CM | POA: Insufficient documentation

## 2015-07-21 DIAGNOSIS — Z7982 Long term (current) use of aspirin: Secondary | ICD-10-CM | POA: Diagnosis not present

## 2015-07-21 DIAGNOSIS — F17210 Nicotine dependence, cigarettes, uncomplicated: Secondary | ICD-10-CM | POA: Diagnosis not present

## 2015-07-21 MED ORDER — HYDROCODONE-ACETAMINOPHEN 5-325 MG PO TABS
1.0000 | ORAL_TABLET | Freq: Once | ORAL | Status: AC
  Administered 2015-07-22: 1 via ORAL
  Filled 2015-07-21: qty 1

## 2015-07-21 MED ORDER — IPRATROPIUM-ALBUTEROL 0.5-2.5 (3) MG/3ML IN SOLN
3.0000 mL | RESPIRATORY_TRACT | Status: DC
  Administered 2015-07-22: 3 mL via RESPIRATORY_TRACT
  Filled 2015-07-21: qty 3

## 2015-07-21 NOTE — ED Provider Notes (Signed)
CSN: [id number]     Arrival date & time 07/21/15  2329 History  By signing my name below, I, Budd PalmerVanessa Prueter, attest that this documentation has been prepared under the direction and in the presence of Paula LibraJohn Carmon Brigandi, MD. Electronically Signed: Budd PalmerVanessa Prueter, ED Scribe. 07/21/2015. 11:53 PM.    Chief Complaint  Patient presents with  . Rash   The history is provided by the patient. No language interpreter was used.   HPI Comments: Alejandro Jones is a 59 y.o. male who presents to the Emergency Department complaining of a worsening, diffuse, itchy, burning rash onset several years ago. Per wife, the rash has been progressively worsening, with the worst sores on pt's neck, which have been causing "extreme pain and frustration," especially because he has been scratching them vigorously. He has tried taking Advil and tylenol with no relief. He has an appointment scheduled with an allergist.   He is also having cough (baseline due to PMHx of COPD) and inability to sleep due to his pain. He has a home care nurse that comes to bandage his sores 2x per week.   Past Medical History  Diagnosis Date  . Hypertension   . Diabetes mellitus   . Acid reflux   . Asthma   . Constipation   . Peripheral vascular disease (HCC)   . COPD (chronic obstructive pulmonary disease) [facility](HCC)    Past Surgical History  Procedure Laterality Date  . Ankle surgery    . Iliac artery stent    . Abdominal aortagram N/A 05/10/2013    Procedure: ABDOMINAL Ronny FlurryAORTAGRAM;  Surgeon: Chuck Hinthristopher S Dickson, MD;  [facility];  Service: Cardiovascular;  Laterality: N/A;  . Lower extremity angiogram Bilateral 05/10/2013    Procedure: LOWER EXTREMITY ANGIOGRAM;  Surgeon: Chuck Hinthristopher S Dickson, MD;  [facility];  Service: Cardiovascular;  Laterality: Bilateral;   Family History  Problem Relation Age of Onset  . Diabetes Mother   . Diabetes Sister   . Cancer Brother   . Diabetes Brother    Social History   Substance Use Topics  . Smoking status: Current Every Day Smoker -- 3.00 packs/day for 45 years    Types: Cigarettes  . Smokeless tobacco: Never Used     Comment: pt states that he is now taking chantix and he is slowly cutting back  . Alcohol Use: No    Review of Systems  All other systems reviewed and are negative.   Allergies  Chantix and Latex  Home Medications   Prior to Admission medications   Medication Sig Start Date End Date Taking? Authorizing Provider  acetaminophen (TYLENOL) 500 MG tablet Take 1,000 mg by mouth every 6 (six) hours as needed for moderate pain.    Historical Provider, MD  albuterol (PROVENTIL) (2.5 MG/3ML) 0.083% nebulizer solution Take 2.5 mg by nebulization every 6 (six) hours as needed for wheezing or shortness of breath.    Historical Provider, MD  aspirin EC 81 MG tablet Take 81 mg by mouth daily.      Historical Provider, MD  atorvastatin (LIPITOR) 20 MG tablet Take 20 mg by mouth daily at 6 PM.     Historical Provider, MD  cephALEXin (KEFLEX) 500 MG capsule Take 1 capsule (500 mg total) by mouth 4 (four) times daily. Patient not taking: Reported on 07/05/2015 06/01/15   Rolland PorterMark James, MD  dextromethorphan [facility](DELSYM) 30 MG/5ML liquid Take 60 mg by mouth as needed for cough.    Historical Provider, MD  doxycycline (VIBRAMYCIN)  100 MG capsule Take 1 capsule (100 mg total) by mouth 2 (two) times daily. One po bid x 7 days Patient not taking: Reported on 06/01/2015 03/22/14   Purvis Sheffield, MD  esomeprazole (NEXIUM) 40 MG capsule Take 1 capsule (40 mg total) by mouth daily before breakfast. Patient not taking: Reported on 06/01/2015 10/05/12   Richarda Overlie, MD  Fluticasone Furoate-Vilanterol (BREO ELLIPTA) 100-25 MCG/INH AEPB Inhale 1 puff into the lungs daily as needed (for shortness of breath).    Historical Provider, MD  glipiZIDE (GLUCOTROL XL) 5 MG 24 hr tablet Take 2 tablets (10 mg total) by mouth daily. Patient not taking: Reported on 06/01/2015 10/05/12    Richarda Overlie, MD  glipiZIDE (GLUCOTROL) 10 MG tablet Take 10 mg by mouth daily before breakfast.    Historical Provider, MD  hydrocortisone (ANUSOL-HC) 2.5 % rectal cream Place 1 application rectally 2 (two) times daily. Patient not taking: Reported on 06/01/2015 10/05/12   Richarda Overlie, MD  ibuprofen (ADVIL,MOTRIN) 200 MG tablet Take 600 mg by mouth every 6 (six) hours as needed. For pain.    Historical Provider, MD  lidocaine (LIDODERM) 5 % Place 2 patches onto the skin daily. Remove & Discard patch within 12 hours or as directed by MD    Historical Provider, MD  lidocaine (LMX) 4 % cream Apply 1 application topically as needed (FOR NECK/HEAD WOUND).    Historical Provider, MD  losartan-hydrochlorothiazide (HYZAAR) 100-12.5 MG per tablet Take 1 tablet by mouth daily.    Historical Provider, MD  Menthol, Topical Analgesic, 5 % PADS Apply 1 patch topically at bedtime as needed (for leg pain).     Historical Provider, MD  methocarbamol (ROBAXIN) 500 MG tablet Take 500 mg by mouth.  07/03/15   Historical Provider, MD  NITROSTAT 0.4 MG SL tablet Place 0.4 mg under the tongue every 5 (five) minutes as needed for chest pain.  05/08/15   Historical Provider, MD  omeprazole (PRILOSEC) 20 MG capsule Take 20 mg by mouth daily.    Historical Provider, MD  OVER THE COUNTER MEDICATION Apply 1 application topically daily as needed (WOUND CARE-THERA HONEY).    Historical Provider, MD  pioglitazone (ACTOS) 30 MG tablet Take 30 mg by mouth daily.    Historical Provider, MD  predniSONE (DELTASONE) 20 MG tablet Take 2 tablets (40 mg total) by mouth daily with breakfast. Patient not taking: Reported on 06/01/2015 01/08/15   Lorre Nick, MD  Saxagliptin-Metformin (KOMBIGLYZE XR) 2.12-998 MG TB24 Take 1 tablet by mouth 2 (two) times daily.    Historical Provider, MD  Saxagliptin-Metformin (KOMBIGLYZE XR) 12-998 MG TB24 Take 1 tablet by mouth daily. Patient not taking: Reported on 06/01/2015 10/05/12   Richarda Overlie, MD   sennosides-docusate sodium (SENOKOT-S) 8.6-50 MG tablet Take 3 tablets by mouth at bedtime.    Historical Provider, MD  traMADol (ULTRAM) 50 MG tablet Take 50 mg by mouth every 6 (six) hours as needed. For pain    Historical Provider, MD  triamcinolone cream (KENALOG) 0.1 % Apply 1 application topically daily as needed (FOR ITCHING).    Historical Provider, MD   BP 155/96 mmHg  Temp(Src) 97.7 F (36.5 C) (Oral)  Resp 22  Ht 6' (1.829 m)  Wt 222 lb (100.699 kg)  BMI 30.10 kg/m2  SpO2 97%   Physical Exam General: Well-developed, well-nourished male in no acute distress; appearance consistent with age of record HENT: normocephalic; atraumatic Eyes: pupils equal, round and reactive to light; extraocular muscles intact Neck: supple  Heart: regular rate and rhythm Lungs: mild inspiratory and expiratory wheezing; rattly cough Abdomen: soft; nondistended; nontender; bowel sounds present Extremities: No deformity; full range of motion; pulses normal; +1 edema of lower legs Neurologic: Awake, alert and oriented; motor function intact in all extremities and symmetric; no facial droop Skin: Warm and dry; multiple facial senile comedones; multiple, shallowly ulcerated plaques, large lesion on neck shown:   Psychiatric: Normal mood and affect  ED Course  Procedures   MDM   Final diagnoses:  Rash  COPD exacerbation (HCC)   I personally performed the services described in this documentation, which was scribed in my presence. The recorded information has been reviewed and is accurate.   Paula Libra, MD 07/22/15 [phone]

## 2015-07-21 NOTE — ED Notes (Signed)
Pt has many rash and burning pain to body. Pt states the one on his neck hurting so bad tonight.

## 2015-07-22 MED ORDER — HYDROCODONE-ACETAMINOPHEN 5-325 MG PO TABS: 1.0000 | ORAL_TABLET | Freq: Four times a day (QID) | ORAL | Status: DC | PRN

## 2015-08-01 ENCOUNTER — Emergency Department (HOSPITAL_BASED_OUTPATIENT_CLINIC_OR_DEPARTMENT_OTHER): Payer: Medicare Other

## 2015-08-01 ENCOUNTER — Encounter (HOSPITAL_BASED_OUTPATIENT_CLINIC_OR_DEPARTMENT_OTHER): Payer: Self-pay | Admitting: *Deleted

## 2015-08-01 ENCOUNTER — Emergency Department (HOSPITAL_BASED_OUTPATIENT_CLINIC_OR_DEPARTMENT_OTHER)
Admission: EM | Admit: 2015-08-01 | Discharge: 2015-08-01 | Disposition: A | Discharge status: Home / Self Care | Payer: Medicare Other | Attending: Emergency Medicine | Admitting: Emergency Medicine

## 2015-08-01 DIAGNOSIS — J441 Chronic obstructive pulmonary disease with (acute) exacerbation: Secondary | ICD-10-CM | POA: Diagnosis not present

## 2015-08-01 DIAGNOSIS — Z7984 Long term (current) use of oral hypoglycemic drugs: Secondary | ICD-10-CM | POA: Insufficient documentation

## 2015-08-01 DIAGNOSIS — Z79899 Other long term (current) drug therapy: Secondary | ICD-10-CM | POA: Insufficient documentation

## 2015-08-01 DIAGNOSIS — Z91040 Latex allergy status: Secondary | ICD-10-CM | POA: Diagnosis not present

## 2015-08-01 DIAGNOSIS — F17210 Nicotine dependence, cigarettes, uncomplicated: Secondary | ICD-10-CM | POA: Diagnosis not present

## 2015-08-01 DIAGNOSIS — J449 Chronic obstructive pulmonary disease, unspecified: Secondary | ICD-10-CM

## 2015-08-01 DIAGNOSIS — E119 Type 2 diabetes mellitus without complications: Secondary | ICD-10-CM | POA: Insufficient documentation

## 2015-08-01 DIAGNOSIS — I10 Essential (primary) hypertension: Secondary | ICD-10-CM | POA: Diagnosis not present

## 2015-08-01 DIAGNOSIS — Z7982 Long term (current) use of aspirin: Secondary | ICD-10-CM | POA: Insufficient documentation

## 2015-08-01 DIAGNOSIS — Z8719 Personal history of other diseases of the digestive system: Secondary | ICD-10-CM | POA: Diagnosis not present

## 2015-08-01 DIAGNOSIS — R21 Rash and other nonspecific skin eruption: Secondary | ICD-10-CM | POA: Diagnosis present

## 2015-08-01 MED ORDER — OXYCODONE-ACETAMINOPHEN 5-325 MG PO TABS
2.0000 | ORAL_TABLET | Freq: Once | ORAL | Status: AC
  Administered 2015-08-01: 2 via ORAL
  Filled 2015-08-01: qty 2

## 2015-08-01 MED ORDER — IPRATROPIUM-ALBUTEROL 0.5-2.5 (3) MG/3ML IN SOLN
3.0000 mL | Freq: Four times a day (QID) | RESPIRATORY_TRACT | Status: DC
  Administered 2015-08-01: 3 mL via RESPIRATORY_TRACT
  Filled 2015-08-01: qty 3

## 2015-08-01 MED ORDER — PREDNISONE 20 MG PO TABS: 40.0000 mg | ORAL_TABLET | Freq: Every day | ORAL | Status: DC

## 2015-08-01 MED ORDER — DIPHENHYDRAMINE HCL 25 MG PO CAPS
50.0000 mg | ORAL_CAPSULE | Freq: Once | ORAL | Status: AC
  Administered 2015-08-01: 50 mg via ORAL
  Filled 2015-08-01: qty 2

## 2015-08-01 NOTE — ED Notes (Signed)
C/o coughing and wheezing that started last pm with fever. Has wounds from allergies all over body with drsg.

## 2015-08-01 NOTE — Discharge Instructions (Signed)

## 2015-08-11 NOTE — ED Provider Notes (Signed)
CSN: [id number]     Arrival date & time 08/01/15  2130 History   First MD Initiated Contact with Patient 08/01/15 1011     No chief complaint on file.    (Consider location/radiation/quality/duration/timing/severity/associated sxs/prior Treatment) HPI   59yM with multiple complaints. Primarily rash. Itches. Chronic, but worse more recently .Has been previously evaluated including by dermatology. Very itching. Has home health nurse helping with wound care a few times per week. Also c/o cough/wheezing. Started last night. nontproductive cough. Subjective fever. No unusual leg pain or swelling.   Past Medical History  Diagnosis Date  . Hypertension   . Diabetes mellitus   . Acid reflux   . Asthma   . Constipation   . Peripheral vascular disease (HCC)   . COPD (chronic obstructive pulmonary disease) [facility])    Past Surgical History  Procedure Laterality Date  . Ankle surgery    . Iliac artery stent    . Abdominal aortagram N/A 05/10/2013    Procedure: ABDOMINAL Ronny Flurry;  Surgeon: Chuck Hint, MD;  Location[facility] CATH LAB;  Service: Cardiovascular;  Laterality: N/A;  . Lower extremity angiogram Bilateral 05/10/2013    Procedure: LOWER EXTREMITY ANGIOGRAM;  Surgeon: Chuck Hint, MD;  Location: [facility] CATH LAB;  Service: Cardiovascular;  Laterality: Bilateral;   Family History  Problem Relation Age of Onset  . Diabetes Mother   . Diabetes Sister   . Cancer Brother   . Diabetes Brother    Social History  Substance Use Topics  . Smoking status: Current Every Day Smoker -- 3.00 packs/day for 45 years    Types: Cigarettes  . Smokeless tobacco: Never Used     Comment: pt states that he is now taking chantix and he is slowly cutting back  . Alcohol Use: No    Review of Systems  All systems reviewed and negative, other than as noted in HPI.   Allergies  Chantix; Latex; and Metformin and related  Home Medications   Prior to Admission medications   Medication  Sig Start Date End Date Taking? Authorizing Provider  acetaminophen (TYLENOL) 500 MG tablet Take 1,000 mg by mouth every 6 (six) hours as needed for moderate pain.   Yes Historical Provider, MD  albuterol (PROVENTIL) (2.5 MG/3ML) 0.083% nebulizer solution Take 2.5 mg by nebulization every 6 (six) hours as needed for wheezing or shortness of breath.   Yes Historical Provider, MD  aspirin EC 81 MG tablet Take 81 mg by mouth daily.     Yes Historical Provider, MD  atorvastatin (LIPITOR) 20 MG tablet Take 20 mg by mouth daily at 6 PM.    Yes Historical Provider, MD  dextromethorphan (DELSYM) 30 MG/5ML liquid Take 60 mg by mouth as needed for cough.   Yes Historical Provider, MD  Fluticasone Furoate-Vilanterol (BREO ELLIPTA) 100-25 MCG/INH AEPB Inhale 1 puff into the lungs daily as needed (for shortness of breath).   Yes Historical Provider, MD  glipiZIDE (GLUCOTROL) 10 MG tablet Take 10 mg by mouth daily before breakfast.   Yes Historical Provider, MD  hydrOXYzine (ATARAX/VISTARIL) 25 MG tablet Take 25 mg by mouth 3 (three) times daily as needed.   Yes Historical Provider, MD  ibuprofen (ADVIL,MOTRIN) 200 MG tablet Take 600 mg by mouth every 6 (six) hours as needed. For pain.   Yes Historical Provider, MD  lidocaine (LIDODERM) 5 % Place 2 patches onto the skin daily. Remove & Discard patch within 12 hours or as directed by MD   Yes Historical  Provider, MD  lidocaine (LMX) 4 % cream Apply 1 application topically as needed (FOR NECK/HEAD WOUND).   Yes Historical Provider, MD  losartan-hydrochlorothiazide (HYZAAR) 100-12.5 MG per tablet Take 1 tablet by mouth daily.   Yes Historical Provider, MD  Menthol, Topical Analgesic, 5 % PADS Apply 1 patch topically at bedtime as needed (for leg pain).    Yes Historical Provider, MD  methocarbamol (ROBAXIN) 500 MG tablet Take 500 mg by mouth.  07/03/15  Yes Historical Provider, MD  NITROSTAT 0.4 MG SL tablet Place 0.4 mg under the tongue every 5 (five) minutes as  needed for chest pain.  05/08/15  Yes Historical Provider, MD  omeprazole (PRILOSEC) 20 MG capsule Take 20 mg by mouth daily.   Yes Historical Provider, MD  pioglitazone (ACTOS) 30 MG tablet Take 30 mg by mouth daily.   Yes Historical Provider, MD  Saxagliptin-Metformin (KOMBIGLYZE XR) 2.12-998 MG TB24 Take 1 tablet by mouth 2 (two) times daily.   Yes Historical Provider, MD  traMADol (ULTRAM) 50 MG tablet Take 50 mg by mouth every 6 (six) hours as needed. For pain   Yes Historical Provider, MD  OVER THE COUNTER MEDICATION Apply 1 application topically daily as needed (WOUND CARE-THERA HONEY).    Historical Provider, MD  predniSONE (DELTASONE) 20 MG tablet Take 2 tablets (40 mg total) by mouth daily. 08/01/15   Raeford RazorStephen Zaia Carre, MD   BP 109/83 mmHg  Pulse 102  Temp(Src) 98.5 F (36.9 C) (Oral)  Resp 20  Ht 6' (1.829 m)  Wt 225 lb (102.059 kg)  BMI 30.51 kg/m2  SpO2 95% Physical Exam  Constitutional: He appears well-developed and well-nourished. No distress.  HENT:  Head: Normocephalic and atraumatic.  Eyes: Conjunctivae are normal. Right eye exhibits no discharge. Left eye exhibits no discharge.  Neck: Neck supple.  Cardiovascular: Normal rate, regular rhythm and normal heart sounds.  Exam reveals no gallop and no friction rub.   No murmur heard. Pulmonary/Chest: Effort normal. No respiratory distress. He has wheezes.  Abdominal: Soft. He exhibits no distension. There is no tenderness.  Musculoskeletal: He exhibits no edema or tenderness.  Neurological: He is alert.  Skin: Skin is warm and dry.  Skin lesions/rash which range in appearance from chronic/scarring to more acute. Most noticeable on upper back and L neck. Seems to primarily affect areas reachable with his hands. Some serous appearing drainage to some areas on upper back. No purulence. No cellulitis.   Psychiatric: He has a normal mood and affect. His behavior is normal. Thought content normal.  Nursing note and vitals  reviewed.   ED Course  Procedures (including critical care time) Labs Review Labs Reviewed - No data to display  Imaging Review No results found. I have personally reviewed and evaluated these images and lab results as part of my medical decision-making.   EKG Interpretation None      MDM   Final diagnoses:  Rash  Chronic obstructive pulmonary disease, unspecified COPD type (HCC)    59yM with rash. Chronic. Does not appear acutely infected. Continue current treatments and will also place on short course of steroids. THis may help both rash and breathing. No acute distress. Wheezing on exam but WOB is not increased.    Raeford RazorStephen Isolde Skaff, MD 08/11/15 1052

## 2015-08-22 ENCOUNTER — Ambulatory Visit (INDEPENDENT_AMBULATORY_CARE_PROVIDER_SITE_OTHER): Payer: Medicare HMO | Admitting: Cardiovascular Disease

## 2015-08-22 ENCOUNTER — Encounter: Payer: Self-pay | Admitting: Cardiovascular Disease

## 2015-08-22 VITALS — BP 110/78 | HR 98 | Ht 72.0 in | Wt 224.0 lb

## 2015-08-22 DIAGNOSIS — I10 Essential (primary) hypertension: Secondary | ICD-10-CM | POA: Diagnosis not present

## 2015-08-22 DIAGNOSIS — Z720 Tobacco use: Secondary | ICD-10-CM

## 2015-08-22 DIAGNOSIS — E785 Hyperlipidemia, unspecified: Secondary | ICD-10-CM | POA: Diagnosis not present

## 2015-08-22 DIAGNOSIS — I739 Peripheral vascular disease, unspecified: Secondary | ICD-10-CM

## 2015-08-22 NOTE — Assessment & Plan Note (Signed)
History of hyperlipidemia on statin therapy followed by his PCP 

## 2015-08-22 NOTE — Assessment & Plan Note (Signed)
History of hypertension blood pressure measured 110/78. He is on losartan and hydrochlorothiazide. Continue current meds at current dosing

## 2015-08-22 NOTE — Assessment & Plan Note (Signed)
Alejandro Jones has history of COPD with 80-100 pack years tobacco abuse currently smoking 2 packs per day. He is recalcitrant to risk factor modification.

## 2015-08-22 NOTE — Patient Instructions (Signed)

## 2015-08-22 NOTE — Assessment & Plan Note (Signed)
Alejandro Jones has a history of PAD status post left iliac stenting in [place] in the past.he has seen Dr. Edilia Bo . Peripheral vascular evaluation. Recent Dopplers performed 07/05/15 revealed a right ABI of 1 with triphasic waveforms in the left ABI 0.63 with monophasic waveforms suggesting an occluded iliac. Dr. Edilia Bo did not feel that angiography was warranted. The patient does have lifestyle limiting claudication bilaterally and continues to smoke 2 packs per day.

## 2015-08-22 NOTE — Progress Notes (Signed)
08/22/2015 Anne Fu   14-Feb-1956  [id number]  Primary Physician Dennis Bast, MD Primary Cardiologist: Runell Gess MD Roseanne Reno   HPI:  Mr. Plotkin is a 60 year old mildly overweight married Caucasian male father of 2, grandfather of one grandchild is accompanied by his wife Claris Che today. He is self referred to our practice to be established for cardiovascular care. His primary care physician is Dr. Ivar Drape and his peripheral vascular specialist is Dr. Edilia Bo. He has a history of peripheral vascular disease status post left iliac stenting in [place] in 2014. He does have lifestyle limiting claudication with Dopplers performed by Dr. Edilia Bo 07/05/15 revealing a right ABI of 1 and a left 0.63 with monophasic waveforms suggesting an occluded iliac artery. He has an 80-100-pack-year history of tobacco abuse currently smoking 2 packs a day and recalcitrant to risk factor modification. History of treated hypertension, hyperlipidemia and diabetes. Ischemic occasional though infrequent atypical chest pain.   Current Outpatient Prescriptions  Medication Sig Dispense Refill  . acetaminophen (TYLENOL) 500 MG tablet Take 1,000 mg by mouth every 6 (six) hours as needed for moderate pain.    Marland Kitchen albuterol (PROVENTIL) (2.5 MG/3ML) 0.083% nebulizer solution Take 2.5 mg by nebulization every 6 (six) hours as needed for wheezing or shortness of breath.    Marland Kitchen aspirin EC 81 MG tablet Take 81 mg by mouth daily.      Marland Kitchen atorvastatin (LIPITOR) 20 MG tablet Take 20 mg by mouth daily at 6 PM.     . dextromethorphan (DELSYM) 30 MG/5ML liquid Take 60 mg by mouth as needed for cough.    . Fluticasone Furoate-Vilanterol (BREO ELLIPTA) 100-25 MCG/INH AEPB Inhale 1 puff into the lungs daily as needed (for shortness of breath).    Marland Kitchen glipiZIDE (GLUCOTROL) 10 MG tablet Take 10 mg by mouth daily before breakfast.    . hydrOXYzine (ATARAX/VISTARIL) 25 MG tablet Take 25 mg by mouth 3 (three) times  daily as needed.    Marland Kitchen ibuprofen (ADVIL,MOTRIN) 200 MG tablet Take 600 mg by mouth every 6 (six) hours as needed. For pain.    Marland Kitchen lidocaine (LIDODERM) 5 % Place 2 patches onto the skin daily. Remove & Discard patch within 12 hours or as directed by MD    . lidocaine (LMX) 4 % cream Apply 1 application topically as needed (FOR NECK/HEAD WOUND).    Marland Kitchen losartan-hydrochlorothiazide (HYZAAR) 100-12.5 MG per tablet Take 1 tablet by mouth daily.    . Menthol, Topical Analgesic, 5 % PADS Apply 1 patch topically at bedtime as needed (for leg pain).     . methocarbamol (ROBAXIN) 500 MG tablet Take 500 mg by mouth.   0  . NITROSTAT 0.4 MG SL tablet Place 0.4 mg under the tongue every 5 (five) minutes as needed for chest pain.   0  . omeprazole (PRILOSEC) 20 MG capsule Take 20 mg by mouth daily.    Marland Kitchen OVER THE COUNTER MEDICATION Apply 1 application topically daily as needed (WOUND CARE-THERA HONEY).    Marland Kitchen pioglitazone (ACTOS) 30 MG tablet Take 30 mg by mouth daily.    . Saxagliptin-Metformin (KOMBIGLYZE XR) 2.12-998 MG TB24 Take 1 tablet by mouth 2 (two) times daily.    . traMADol (ULTRAM) 50 MG tablet Take 50 mg by mouth every 6 (six) hours as needed. For pain    . [DISCONTINUED] esomeprazole (NEXIUM) 40 MG capsule Take 1 capsule (40 mg total) by mouth daily before breakfast. (Patient not taking: Reported on 06/01/2015) 30 capsule  2   No current facility-administered medications for this visit.    Allergies  Allergen Reactions  . Chantix [Varenicline] Other (See Comments)    Extreme anger and depression   . Latex Itching and Hives  . Metformin And Related Nausea Only    Social History   Social History  . Marital Status: Married    Spouse Name: N/A  . Number of Children: N/A  . Years of Education: N/A   Occupational History  . Not on file.   Social History Main Topics  . Smoking status: Current Every Day Smoker -- 3.00 packs/day for 45 years    Types: Cigarettes  . Smokeless tobacco: Never  Used     Comment: pt states that he is now taking chantix and he is slowly cutting back  . Alcohol Use: No  . Drug Use: No  . Sexual Activity: Not on file   Other Topics Concern  . Not on file   Social History Narrative     Review of Systems: General: negative for chills, fever, night sweats or weight changes.  Cardiovascular: negative for chest pain, dyspnea on exertion, edema, orthopnea, palpitations, paroxysmal nocturnal dyspnea or shortness of breath Dermatological: negative for rash Respiratory: negative for cough or wheezing Urologic: negative for hematuria Abdominal: negative for nausea, vomiting, diarrhea, bright red blood per rectum, melena, or hematemesis Neurologic: negative for visual changes, syncope, or dizziness All other systems reviewed and are otherwise negative except as noted above.    Blood pressure 110/78, pulse 98, height 6' (1.829 m), weight 224 lb (101.606 kg).  General appearance: alert and no distress Neck: no adenopathy, no carotid bruit, no JVD, supple, symmetrical, trachea midline and thyroid not enlarged, symmetric, no tenderness/mass/nodules Lungs: clear to auscultation bilaterally Heart: regular rate and rhythm, S1, S2 normal, no murmur, click, rub or gallop Extremities: extremities normal, atraumatic, no cyanosis or edema  EKG not performed today  ASSESSMENT AND PLAN:   PVD (peripheral vascular disease) (HCC) Mr. Cambre has a history of PAD status post left iliac stenting in [place] in the past.he has seen Dr. Edilia Bo . Peripheral vascular evaluation. Recent Dopplers performed 07/05/15 revealed a right ABI of 1 with triphasic waveforms in the left ABI 0.63 with monophasic waveforms suggesting an occluded iliac. Dr. Edilia Bo did not feel that angiography was warranted. The patient does have lifestyle limiting claudication bilaterally and continues to smoke 2 packs per day.  Tobacco abuse Mr. Musto has history of COPD with 80-100 pack  years tobacco abuse currently smoking 2 packs per day. He is recalcitrant to risk factor modification.  Hyperlipidemia History of hyperlipidemia on statin therapy followed by his PCP  Essential hypertension History of hypertension blood pressure measured 110/78. He is on losartan and hydrochlorothiazide. Continue current meds at current dosing      Runell Gess MD [facility], [facility] 08/22/2015 4:40 PM

## 2015-08-24 ENCOUNTER — Ambulatory Visit: Payer: Medicare Other | Admitting: Podiatry

## 2015-08-26 ENCOUNTER — Encounter (HOSPITAL_BASED_OUTPATIENT_CLINIC_OR_DEPARTMENT_OTHER): Payer: Self-pay | Admitting: *Deleted

## 2015-08-26 ENCOUNTER — Emergency Department (HOSPITAL_BASED_OUTPATIENT_CLINIC_OR_DEPARTMENT_OTHER)
Admission: EM | Admit: 2015-08-26 | Discharge: 2015-08-26 | Disposition: A | Discharge status: Home / Self Care | Payer: Medicare HMO | Attending: Emergency Medicine | Admitting: Emergency Medicine

## 2015-08-26 DIAGNOSIS — I10 Essential (primary) hypertension: Secondary | ICD-10-CM | POA: Diagnosis not present

## 2015-08-26 DIAGNOSIS — J449 Chronic obstructive pulmonary disease, unspecified: Secondary | ICD-10-CM | POA: Insufficient documentation

## 2015-08-26 DIAGNOSIS — L98499 Non-pressure chronic ulcer of skin of other sites with unspecified severity: Secondary | ICD-10-CM | POA: Diagnosis not present

## 2015-08-26 DIAGNOSIS — Z7951 Long term (current) use of inhaled steroids: Secondary | ICD-10-CM | POA: Insufficient documentation

## 2015-08-26 DIAGNOSIS — Z79899 Other long term (current) drug therapy: Secondary | ICD-10-CM | POA: Insufficient documentation

## 2015-08-26 DIAGNOSIS — F17210 Nicotine dependence, cigarettes, uncomplicated: Secondary | ICD-10-CM | POA: Diagnosis not present

## 2015-08-26 DIAGNOSIS — K219 Gastro-esophageal reflux disease without esophagitis: Secondary | ICD-10-CM | POA: Insufficient documentation

## 2015-08-26 DIAGNOSIS — Z91040 Latex allergy status: Secondary | ICD-10-CM | POA: Insufficient documentation

## 2015-08-26 DIAGNOSIS — Z7984 Long term (current) use of oral hypoglycemic drugs: Secondary | ICD-10-CM | POA: Insufficient documentation

## 2015-08-26 DIAGNOSIS — Z7982 Long term (current) use of aspirin: Secondary | ICD-10-CM | POA: Insufficient documentation

## 2015-08-26 DIAGNOSIS — Z4800 Encounter for change or removal of nonsurgical wound dressing: Secondary | ICD-10-CM | POA: Diagnosis present

## 2015-08-26 DIAGNOSIS — L299 Pruritus, unspecified: Secondary | ICD-10-CM | POA: Diagnosis not present

## 2015-08-26 DIAGNOSIS — E119 Type 2 diabetes mellitus without complications: Secondary | ICD-10-CM | POA: Diagnosis not present

## 2015-08-26 DIAGNOSIS — T07XXXA Unspecified multiple injuries, initial encounter: Secondary | ICD-10-CM

## 2015-08-26 NOTE — ED Notes (Signed)
Pt presents with sharp pain at multiple areas, states has a lot of dry skins has multiple wound sites being managed by [facility].

## 2015-08-26 NOTE — ED Notes (Signed)
DC instructions reviewed with pt and wife, discussed importance of hand washing and dsg care. Stressed importance of making and keeping follow up appt with wound care as recommend by EDP. Opportunity for questions provided. Teach Back Method used

## 2015-08-26 NOTE — ED Notes (Signed)
Using new bandages for multiple chronic wounds. Feels he may be allergic to the bandages

## 2015-08-26 NOTE — ED Notes (Signed)
Per EDP orders, each area prev noted, covered in [place] dsg, held in place with guaze and tegaderm dsg. Pt tolerated well, used clean, sterile technique for dsg application.

## 2015-08-26 NOTE — ED Notes (Signed)
Has the following areas: Left forearm, no dsg in place, very red in color Rt side of neck, dsg in place, drainage noted on dsg Lt side of neck, dsg in place Mid upper back, dsg in place Lt scapula area, dsg in place Mid lower back area, dsg in place

## 2015-08-26 NOTE — ED Provider Notes (Signed)
CSN: [id number]     Arrival date & time 08/26/15  1309 History   First MD Initiated Contact with Patient 08/26/15 1352     Chief Complaint  Patient presents with  . Wound Check     (Consider location/radiation/quality/duration/timing/severity/associated sxs/prior Treatment) HPI Comments: 60 year old male with a history of hypertensiondiabetes, COPD, peripheral last 2 or disease, allergic type skin disease for which she seen an allergist dermatologist and has multiple wounds from scratching presents with concern for swelling surrounding one of the wounds.  Years of symptoms, has seen dermatologist and had biopsy Saw wound doctor but reports he wanted more information through records to determine etiology, and they did not feel consult was helpful Home health care comes to home to change bandages, or using Tegaderm, however recently changed to  other dressing, and they feel he might have an allergic reaction to this with increased itching Lesion left forearm with mild swellign x days Severe pruritis, takes atarax at night Does stay with dog which he is allergic to No fevers   Past Medical History  Diagnosis Date  . Hypertension   . Diabetes mellitus   . Acid reflux   . Asthma   . Constipation   . Peripheral vascular disease (HCC)   . COPD (chronic obstructive pulmonary disease) [facility])    Past Surgical History  Procedure Laterality Date  . Ankle surgery    . Iliac artery stent    . Abdominal aortagram N/A 05/10/2013    Procedure: ABDOMINAL Ronny Flurry;  Surgeon: Chuck Hint, MD;  Location: [facility] CATH LAB;  Service: Cardiovascular;  Laterality: N/A;  . Lower extremity angiogram Bilateral 05/10/2013    Procedure: LOWER EXTREMITY ANGIOGRAM;  Surgeon: Chuck Hint, MD;  Location: [facility] CATH LAB;  Service: Cardiovascular;  Laterality: Bilateral;   Family History  Problem Relation Age of Onset  . Diabetes Mother   . Diabetes Sister   . Cancer Brother   . Diabetes Brother     Social History  Substance Use Topics  . Smoking status: Current Every Day Smoker -- 3.00 packs/day for 45 years    Types: Cigarettes  . Smokeless tobacco: Never Used     Comment: pt states that he is now taking chantix and he is slowly cutting back  . Alcohol Use: No    Review of Systems  Constitutional: Negative for fever.  HENT: Negative for sore throat.   Eyes: Negative for visual disturbance.  Respiratory: Negative for shortness of breath.   Cardiovascular: Negative for chest pain.  Gastrointestinal: Negative for nausea, vomiting and abdominal pain.  Genitourinary: Negative for difficulty urinating.  Musculoskeletal: Negative for back pain and neck stiffness.  Skin: Positive for rash and wound.  Neurological: Negative for syncope and headaches.      Allergies  Chantix; Latex; and Metformin and related  Home Medications   Prior to Admission medications   Medication Sig Start Date End Date Taking? Authorizing Provider  acetaminophen (TYLENOL) 500 MG tablet Take 1,000 mg by mouth every 6 (six) hours as needed for moderate pain.    Historical Provider, MD  albuterol (PROVENTIL) (2.5 MG/3ML) 0.083% nebulizer solution Take 2.5 mg by nebulization every 6 (six) hours as needed for wheezing or shortness of breath.    Historical Provider, MD  aspirin EC 81 MG tablet Take 81 mg by mouth daily.      Historical Provider, MD  atorvastatin (LIPITOR) 20 MG tablet Take 20 mg by mouth daily at 6 PM.     Historical  Provider, MD  dextromethorphan (DELSYM) 30 MG/5ML liquid Take 60 mg by mouth as needed for cough.    Historical Provider, MD  Fluticasone Furoate-Vilanterol (BREO ELLIPTA) 100-25 MCG/INH AEPB Inhale 1 puff into the lungs daily as needed (for shortness of breath).    Historical Provider, MD  glipiZIDE (GLUCOTROL) 10 MG tablet Take 10 mg by mouth daily before breakfast.    Historical Provider, MD  hydrOXYzine (ATARAX/VISTARIL) 25 MG tablet Take 25 mg by mouth 3 (three) times daily  as needed.    Historical Provider, MD  ibuprofen (ADVIL,MOTRIN) 200 MG tablet Take 600 mg by mouth every 6 (six) hours as needed. For pain.    Historical Provider, MD  lidocaine (LIDODERM) 5 % Place 2 patches onto the skin daily. Remove & Discard patch within 12 hours or as directed by MD    Historical Provider, MD  lidocaine (LMX) 4 % cream Apply 1 application topically as needed (FOR NECK/HEAD WOUND).    Historical Provider, MD  losartan-hydrochlorothiazide (HYZAAR) 100-12.5 MG per tablet Take 1 tablet by mouth daily.    Historical Provider, MD  Menthol, Topical Analgesic, 5 % PADS Apply 1 patch topically at bedtime as needed (for leg pain).     Historical Provider, MD  methocarbamol (ROBAXIN) 500 MG tablet Take 500 mg by mouth.  07/03/15   Historical Provider, MD  NITROSTAT 0.4 MG SL tablet Place 0.4 mg under the tongue every 5 (five) minutes as needed for chest pain.  05/08/15   Historical Provider, MD  omeprazole (PRILOSEC) 20 MG capsule Take 20 mg by mouth daily.    Historical Provider, MD  OVER THE COUNTER MEDICATION Apply 1 application topically daily as needed (WOUND CARE-THERA HONEY).    Historical Provider, MD  pioglitazone (ACTOS) 30 MG tablet Take 30 mg by mouth daily.    Historical Provider, MD  Saxagliptin-Metformin (KOMBIGLYZE XR) 2.12-998 MG TB24 Take 1 tablet by mouth 2 (two) times daily.    Historical Provider, MD  traMADol (ULTRAM) 50 MG tablet Take 50 mg by mouth every 6 (six) hours as needed. For pain    Historical Provider, MD   BP 142/86 mmHg  Pulse 112  Temp(Src) 98.2 F (36.8 C) (Oral)  Resp 18  Ht 6' (1.829 m)  Wt 223 lb (101.152 kg)  BMI 30.24 kg/m2  SpO2 95% Physical Exam  Constitutional: He is oriented to person, place, and time. He appears well-developed and well-nourished. No distress.  HENT:  Head: Normocephalic and atraumatic.  Eyes: Conjunctivae and EOM are normal.  Neck: Normal range of motion.  Cardiovascular: Normal rate, regular rhythm, normal heart  sounds and intact distal pulses.  Exam reveals no gallop and no friction rub.   No murmur heard. Pulmonary/Chest: Effort normal and breath sounds normal. No respiratory distress. He has no wheezes. He has no rales.  Abdominal: Soft. He exhibits no distension. There is no tenderness. There is no guarding.  Musculoskeletal: He exhibits no edema.  Neurological: He is alert and oriented to person, place, and time.  Skin: Skin is warm and dry. He is not diaphoretic.  Multiple wounds/skin ulceration Left forearm 5x6cm Neck right side 8x7cm Multiple lesions on back x 4, varying sizes Buttock wound 3x3cm No surrounding erythema Other wounds various stages of healing, signs of healed lesions/scars   Nursing note and vitals reviewed.   ED Course  Procedures (including critical care time) Labs Review Labs Reviewed - No data to display  Imaging Review No results found. I have personally reviewed  and evaluated these images and lab results as part of my medical decision-making.   EKG Interpretation None      MDM   Final diagnoses:  Multiple wounds  Itching   60 year old male with a history of hypertension, diabetes, COPD, peripheral last 2 or disease, allergic type skin disease for which she seen an allergist dermatologist and has multiple wounds from scratching presents with concern for swelling surrounding one of the wounds.  Patient with multiple wounds over arm, neck, back, buttocks which were all examined and showed no sign of surrounding cellulitis. Family concerned regarding left forearm wound, however there is no sign of cellulitis or fluctuance. Patient is afebrile. He is tachycardic, however this pain has been noted on prior visits and have low suspicion for other acute pathology.  Wounds examined without sign of infection and placed new dressing with xeroform.  Patient has home health and wife changes bandages BID. Recommend continued wound care, follow up with  allergist/dermatologist and wound care physician. Recommended increasing hydroxizine from  Qnightly to TID prn given significant pruritis contributing to wounds not healing. Patient discharged in stable condition with understanding of reasons to return.    Alvira Monday, MD 08/27/15 0730

## 2015-08-31 ENCOUNTER — Encounter: Payer: Medicare HMO | Attending: Surgery | Admitting: Surgery

## 2015-08-31 DIAGNOSIS — E785 Hyperlipidemia, unspecified: Secondary | ICD-10-CM | POA: Diagnosis not present

## 2015-08-31 DIAGNOSIS — I10 Essential (primary) hypertension: Secondary | ICD-10-CM | POA: Insufficient documentation

## 2015-08-31 DIAGNOSIS — L299 Pruritus, unspecified: Secondary | ICD-10-CM | POA: Diagnosis not present

## 2015-08-31 DIAGNOSIS — F17218 Nicotine dependence, cigarettes, with other nicotine-induced disorders: Secondary | ICD-10-CM | POA: Insufficient documentation

## 2015-08-31 DIAGNOSIS — L280 Lichen simplex chronicus: Secondary | ICD-10-CM | POA: Insufficient documentation

## 2015-08-31 DIAGNOSIS — E1165 Type 2 diabetes mellitus with hyperglycemia: Secondary | ICD-10-CM | POA: Diagnosis not present

## 2015-08-31 DIAGNOSIS — E11620 Type 2 diabetes mellitus with diabetic dermatitis: Secondary | ICD-10-CM | POA: Insufficient documentation

## 2015-08-31 DIAGNOSIS — Z7982 Long term (current) use of aspirin: Secondary | ICD-10-CM | POA: Insufficient documentation

## 2015-08-31 DIAGNOSIS — E7800 Pure hypercholesterolemia, unspecified: Secondary | ICD-10-CM | POA: Insufficient documentation

## 2015-08-31 DIAGNOSIS — J449 Chronic obstructive pulmonary disease, unspecified: Secondary | ICD-10-CM | POA: Diagnosis not present

## 2015-08-31 DIAGNOSIS — Z79899 Other long term (current) drug therapy: Secondary | ICD-10-CM | POA: Diagnosis not present

## 2015-08-31 NOTE — Progress Notes (Addendum)
AMBROSIO, REUTER ([id number]) Visit Report for 08/31/2015 Chief Complaint Document Details Patient Name: Alejandro Jones, Alejandro Jones Date of Service: 08/31/2015 8:45 AM Medical Record Number: [id number] Patient Account Number: [id number] Date of Birth/Sex: 09-May-1956 (60 y.o. Male) Treating RN: Clover Mealy, RN, BSN, [place] Sink Primary Care Physician: Dennis Bast Other Clinician: Referring Physician: Treating Physician/Extender: Rudene Re in Treatment: 0 Information Obtained from: Patient Chief Complaint Patient seen for complaints of Non-Healing Wound all over the body including his back neck and scalp which she's had for over a year and a half. Electronic Signature(s) Signed: 08/31/2015 9:31:42 AM By: Evlyn Kanner MD, FACS Entered By: Evlyn Kanner on 08/31/2015 09:31:42 Alejandro Jones ([id number]) -------------------------------------------------------------------------------- HPI Details Patient Name: Alejandro Jones Date of Service: 08/31/2015 8:45 AM Medical Record Number: [id number] Patient Account Number: [id number] Date of Birth/Sex: 04/30/1956 (60 y.o. Male) Treating RN: Clover Mealy, RN, BSN, [place] Sink Primary Care Physician: Dennis Bast Other Clinician: Referring Physician: Treating Physician/Extender: Rudene Re in Treatment: 0 History of Present Illness Location: back neck and left arm and scalp Quality: Patient reports experiencing burning to affected area(s)with a lot of pruritus and itching Severity: Patient states wound are getting worse. Duration: Patient has had the wound for >18 months prior to seeking treatment at the wound center Timing: Pain in wound is Intermittent (comes and goes Context: The wound appeared gradually over time Modifying Factors: Other treatment(s) tried include:has seen allergist and dermatologist and also several trips to the ER Associated Signs and Symptoms: Wound has significant periowound erythema and localized edema HPI Description:  60 year old gentleman with diabetes mellitus, who was recently in the ER for multiple wounds caused by pruritus and was asked to follow-up with his dermatologist and the [facility] wound center where he has been seen before. I have reviewed Dr. Jannetta Quint notes from September and October of last year. He has been diagnosed by the dermatologist Dr. Merlene Pulling for neurotic excoriation with superinfection. a skin biopsy was done in the past which shows that there was inflammation and excoriation. No primary vascular findings were noted on the pathology. In the past, has been seen by Dr. Edilia Bo for peripheral vascular disease and has had a history of left eyelid extending in [place] in 2014. The patient has severe claudication and workup done in November 2016 revealed a ABI of 1 on the right and 0.63 on the left with monophasic waveform suggesting occluded iliac artery. He is a heavy smoker smoking about 2 packs of cigarettes a day and is also known to have a past medical history significant for hypertension hyperlipidemia diabetes and cardiac disease. Electronic Signature(s) Signed: 08/31/2015 9:32:59 AM By: Evlyn Kanner MD, FACS Previous Signature: 08/31/2015 9:29:38 AM Version By: Evlyn Kanner MD, FACS Previous Signature: 08/31/2015 9:04:20 AM Version By: Evlyn Kanner MD, FACS Previous Signature: 08/31/2015 8:49:27 AM Version By: Evlyn Kanner MD, FACS Entered By: Evlyn Kanner on 08/31/2015 09:32:59 Alejandro Jones ([id number]) -------------------------------------------------------------------------------- Physical Exam Details Patient Name: Alejandro Jones Date of Service: 08/31/2015 8:45 AM Medical Record Number: [id number] Patient Account Number: [id number] Date of Birth/Sex: 1956-05-13 (60 y.o. Male) Treating RN: Clover Mealy, RN, BSN, [place] Sink Primary Care Physician: Dennis Bast Other Clinician: Referring Physician: Treating Physician/Extender: Rudene Re in Treatment:  0 Constitutional . Pulse regular. Respirations normal and unlabored. Afebrile. . Eyes Nonicteric. Reactive to light. Ears, Nose, Mouth, and Throat Lips, teeth, and gums WNL.Marland Kitchen Moist mucosa without lesions. Neck supple and nontender. No palpable supraclavicular or cervical adenopathy. Normal sized without goiter. Respiratory WNL. No retractions.. Cardiovascular Pedal Pulses WNL. No clubbing, cyanosis or  edema. Gastrointestinal (GI) Abdomen without masses or tenderness.. No liver or spleen enlargement or tenderness.. Lymphatic No adneopathy. No adenopathy. No adenopathy. Musculoskeletal Adexa without tenderness or enlargement.. Digits and nails w/o clubbing, cyanosis, infection, petechiae, ischemia, or inflammatory conditions.. Integumentary (Hair, Skin) No suspicious lesions. No crepitus or fluctuance. No peri-wound warmth or erythema. No masses.Marland Kitchen Psychiatric Judgement and insight Intact.. No evidence of depression, anxiety, or agitation.. Notes multiple areas all over his back and neck scalp and left forearm which are wounds caused by excoriation of his skin due to pruritus, localized dermatitis and superinfection. No debridement has been done. Electronic Signature(s) Signed: 08/31/2015 9:33:49 AM By: Evlyn Kanner MD, FACS Entered By: Evlyn Kanner on 08/31/2015 09:33:49 Alejandro Jones ([id number]) -------------------------------------------------------------------------------- Physician Orders Details Patient Name: Alejandro Jones Date of Service: 08/31/2015 8:45 AM Medical Record Number: [id number] Patient Account Number: [id number] Date of Birth/Sex: October 04, 1955 (60 y.o. 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 L28.0 Lichen simplex chronicus L29.9 Pruritus, unspecified F17.218 Nicotine dependence, cigarettes, with other nicotine-induced disorders Wound Cleansing Wound #1 Right Neck o Cleanse wound with mild soap and water Wound #2 Right,Superior Back o Cleanse wound with mild soap and water Wound #3 Midline Back o Cleanse wound with mild soap and water Wound #4 Right Coccyx o Cleanse wound with mild soap and water Wound #5 Right Gluteus o Cleanse wound with mild soap and water Wound #6 Left Gluteus o Cleanse wound with mild soap and water Wound #7 Left Forearm o Cleanse wound with mild soap and water Primary Wound Dressing Wound #1 Right Neck o Other: - thera Honey....patient will apply at home Wound #2 Right,Superior Back o Other: - thera Honey....patient will apply at home Wound #3 Midline Back Alejandro Jones, Alejandro Jones ([id number]) o Other: - thera Honey....patient will apply at home Wound #4 Right Coccyx o Other: - thera Honey....patient will apply at home Wound #5 Right Gluteus o Other: - thera Honey....patient will apply at home Wound #6 Left Gluteus o Other: - thera Honey....patient will apply at home Wound #7 Left Forearm o Other: - thera Honey....patient will apply at home Secondary Dressing Wound #1 Right Neck o Non-adherent pad - [place] Wound #2 Right,Superior Back o Non-adherent pad - [place] Wound #3 Midline Back o Non-adherent pad - [place] Wound #4 Right Coccyx o Non-adherent pad - [place] Wound #5 Right Gluteus o Non-adherent pad - [place] Wound #6 Left Gluteus o Non-adherent pad - [place] Wound #7 Left Forearm o Non-adherent pad - [place] Dressing Change Frequency Wound #1 Right Neck o Change dressing every day. Wound #2 Right,Superior Back o Change dressing every day. Wound #3 Midline Back o  Change dressing every day. Wound #4 Right Coccyx o Change dressing every day. Primrose, Kingsley Plan ([id number]) Wound #5 Right Gluteus o Change dressing every day. Wound #6 Left Gluteus o Change dressing every day. Wound #7 Left Forearm o Change dressing every day. Follow-up Appointments Wound #1 Right Neck o Return Appointment in 1 month Wound #2 Right,Superior Back o Return Appointment in 1 month Wound #3 Midline Back o Return Appointment in 1 month Wound #4 Right Coccyx o Return Appointment in 1 month Wound #5 Right Gluteus o Return Appointment in 1 month Wound #6  Left Gluteus o Return Appointment in 1 month Wound #7 Left Forearm o Return Appointment in 1 month Additional Orders / Instructions Wound #1 Right Neck o Stop Smoking Wound #2 Right,Superior Back o Stop Smoking Wound #3 Midline Back o Stop Smoking Wound #4 Right Coccyx o Stop Smoking Wound #5 Right Gluteus o Stop Smoking Wound #6 Left Gluteus Alejandro Jones, Alejandro Jones ([id number]) o Stop Smoking Wound #7 Left Forearm o Stop Smoking Home Health Wound #1 Right Neck o Initiate [facility] o Continue Home Health Visits - Home health to order all patient supplies. o Home Health Nurse may visit PRN to address patientos wound care needs. o FACE TO FACE ENCOUNTER: MEDICARE and MEDICAID PATIENTS: I certify that this patient is under my care and that I had a face-to-face encounter that meets the physician face-to-face encounter requirements with this patient on this date. The encounter with the patient was in whole or in part for the following MEDICAL CONDITION: (primary reason for Home Healthcare) MEDICAL NECESSITY: I certify, that based on my findings, NURSING services are a medically necessary home health service. HOME BOUND STATUS: I certify that my clinical findings support that this patient is homebound (i.e., Due to illness or  injury, pt requires aid of supportive devices such as crutches, cane, wheelchairs, walkers, the use of special transportation or the assistance of another person to leave their place of residence. There is a normal inability to leave the home and doing so requires considerable and taxing effort. Other absences are for medical reasons / religious services and are infrequent or of short duration when for other reasons). o If current dressing causes regression in wound condition, may D/C ordered dressing product/s and apply Normal Saline Moist Dressing daily until next Wound Healing Center / Other MD appointment. Notify Wound Healing Center of regression in wound condition at [phone]. Wound #2 Right,Superior Back o Initiate Home [facility] o Continue Home Health Visits - Home health to order all patient supplies. o Home Health Nurse may visit PRN to address patientos wound care needs. o FACE TO FACE ENCOUNTER: MEDICARE and MEDICAID PATIENTS: I certify that this patient is under my care and that I had a face-to-face encounter that meets the physician face-to-face encounter requirements with this patient on this date. The encounter with the patient was in whole or in part for the following MEDICAL CONDITION: (primary reason for Home Healthcare) MEDICAL NECESSITY: I certify, that based on my findings, NURSING services are a medically necessary home health service. HOME BOUND STATUS: I certify that my clinical findings support that this patient is homebound (i.e., Due to illness or injury, pt requires aid of supportive devices such as crutches, cane, wheelchairs, walkers, the use of special transportation or the assistance of another person to leave their place of residence. There is a normal inability to leave the home and doing so requires considerable and taxing effort. Other absences are for medical reasons / religious services and are  infrequent or of short duration when for other reasons). o If current dressing causes regression in wound condition, may D/C ordered dressing product/s and apply Normal Saline Moist Dressing daily until next Wound Healing Center / Other MD appointment. Notify Wound Healing Center of regression in wound condition at [phone]. Wound #3 Midline Back o Initiate Home Health [facility] o Continue Home Health Visits - Home health to order all patient  supplies. Alejandro Jones, Alejandro Jones ([id number]) o Home Health Nurse may visit PRN to address patientos wound care needs. o FACE TO FACE ENCOUNTER: MEDICARE and MEDICAID PATIENTS: I certify that this patient is under my care and that I had a face-to-face encounter that meets the physician face-to-face encounter requirements with this patient on this date. The encounter with the patient was in whole or in part for the following MEDICAL CONDITION: (primary reason for Home Healthcare) MEDICAL NECESSITY: I certify, that based on my findings, NURSING services are a medically necessary home health service. HOME BOUND STATUS: I certify that my clinical findings support that this patient is homebound (i.e., Due to illness or injury, pt requires aid of supportive devices such as crutches, cane, wheelchairs, walkers, the use of special transportation or the assistance of another person to leave their place of residence. There is a normal inability to leave the home and doing so requires considerable and taxing effort. Other absences are for medical reasons / religious services and are infrequent or of short duration when for other reasons). o If current dressing causes regression in wound condition, may D/C ordered dressing product/s and apply Normal Saline Moist Dressing daily until next Wound Healing Center / Other MD appointment. Notify Wound Healing Center of regression in wound condition at [phone]. Wound #4  Right Coccyx o Initiate Home Health [facility] o Continue Home Health Visits - Home health to order all patient supplies. o Home Health Nurse may visit PRN to address patientos wound care needs. o FACE TO FACE ENCOUNTER: MEDICARE and MEDICAID PATIENTS: I certify that this patient is under my care and that I had a face-to-face encounter that meets the physician face-to-face encounter requirements with this patient on this date. The encounter with the patient was in whole or in part for the following MEDICAL CONDITION: (primary reason for Home Healthcare) MEDICAL NECESSITY: I certify, that based on my findings, NURSING services are a medically necessary home health service. HOME BOUND STATUS: I certify that my clinical findings support that this patient is homebound (i.e., Due to illness or injury, pt requires aid of supportive devices such as crutches, cane, wheelchairs, walkers, the use of special transportation or the assistance of another person to leave their place of residence. There is a normal inability to leave the home and doing so requires considerable and taxing effort. Other absences are for medical reasons / religious services and are infrequent or of short duration when for other reasons). o If current dressing causes regression in wound condition, may D/C ordered dressing product/s and apply Normal Saline Moist Dressing daily until next Wound Healing Center / Other MD appointment. Notify Wound Healing Center of regression in wound condition at [phone]. Wound #5 Right Gluteus o Initiate Home Health for [facility] o Continue Home Health Visits - Home health to order all patient supplies. o Home Health Nurse may visit PRN to address patientos wound care needs. o FACE TO FACE ENCOUNTER: MEDICARE and MEDICAID PATIENTS: I certify that this patient is under my care and that I had a face-to-face  encounter that meets the physician face-to-face encounter requirements with this patient on this date. The encounter with the patient was in whole or in part for the following MEDICAL CONDITION: (primary reason for Home Healthcare) MEDICAL NECESSITY: I certify, that based on my findings, NURSING services are a medically necessary home health service. HOME BOUND STATUS: I certify that my clinical findings support  that this patient is homebound (i.e., Due to illness or injury, pt requires aid of supportive devices such as crutches, cane, wheelchairs, walkers, the use of special transportation or the assistance of another person to leave their place of residence. There is a ETTORE, TREBILCOCK ([id number]) normal inability to leave the home and doing so requires considerable and taxing effort. Other absences are for medical reasons / religious services and are infrequent or of short duration when for other reasons). o If current dressing causes regression in wound condition, may D/C ordered dressing product/s and apply Normal Saline Moist Dressing daily until next Wound Healing Center / Other MD appointment. Notify Wound Healing Center of regression in wound condition at [phone]. Wound #6 Left Gluteus o Initiate Home Health for [facility] o Continue Home Health Visits - Home health to order all patient supplies. o Home Health Nurse may visit PRN to address patientos wound care needs. o FACE TO FACE ENCOUNTER: MEDICARE and MEDICAID PATIENTS: I certify that this patient is under my care and that I had a face-to-face encounter that meets the physician face-to-face encounter requirements with this patient on this date. The encounter with the patient was in whole or in part for the following MEDICAL CONDITION: (primary reason for Home Healthcare) MEDICAL NECESSITY: I certify, that based on my findings, NURSING services are a medically necessary home health  service. HOME BOUND STATUS: I certify that my clinical findings support that this patient is homebound (i.e., Due to illness or injury, pt requires aid of supportive devices such as crutches, cane, wheelchairs, walkers, the use of special transportation or the assistance of another person to leave their place of residence. There is a normal inability to leave the home and doing so requires considerable and taxing effort. Other absences are for medical reasons / religious services and are infrequent or of short duration when for other reasons). o If current dressing causes regression in wound condition, may D/C ordered dressing product/s and apply Normal Saline Moist Dressing daily until next Wound Healing Center / Other MD appointment. Notify Wound Healing Center of regression in wound condition at [phone]. Wound #7 Left Forearm o Initiate Home Health [facility] o Continue Home Health Visits - Home health to order all patient supplies. o Home Health Nurse may visit PRN to address patientos wound care needs. o FACE TO FACE ENCOUNTER: MEDICARE and MEDICAID PATIENTS: I certify that this patient is under my care and that I had a face-to-face encounter that meets the physician face-to-face encounter requirements with this patient on this date. The encounter with the patient was in whole or in part for the following MEDICAL CONDITION: (primary reason for Home Healthcare) MEDICAL NECESSITY: I certify, that based on my findings, NURSING services are a medically necessary home health service. HOME BOUND STATUS: I certify that my clinical findings support that this patient is homebound (i.e., Due to illness or injury, pt requires aid of supportive devices such as crutches, cane, wheelchairs, walkers, the use of special transportation or the assistance of another person to leave their place of residence. There is a normal inability to leave the home and  doing so requires considerable and taxing effort. Other absences are for medical reasons / religious services and are infrequent or of short duration when for other reasons). o If current dressing causes regression in wound condition, may D/C ordered dressing product/s and apply Normal Saline Moist Dressing daily until next Wound  Healing Center / Other MD appointment. Notify Wound Healing Center of regression in wound condition at [phone]. Electronic Signature(s) Signed: 08/31/2015 4:27:41 PM By: Evlyn Kanner MD, FACS [place], Kingsley Plan ([id number]) Signed: 08/31/2015 4:55:10 PM By: Elpidio Eric BSN, RN Entered By: Elpidio Eric on 08/31/2015 09:42:51 Alejandro Jones ([id number]) -------------------------------------------------------------------------------- Problem List Details Patient Name: Alejandro Jones Date of Service: 08/31/2015 8:45 AM Medical Record Number: [id number] Patient Account Number: [id number] Date of Birth/Sex: Sep 13, 1955 (60 y.o. 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 08/31/2015 Yes L28.0 Lichen simplex chronicus 08/31/2015 Yes L29.9 Pruritus, unspecified 08/31/2015 Yes F17.218 Nicotine dependence, cigarettes, with other nicotine- 08/31/2015 Yes induced disorders Inactive Problems Resolved Problems Electronic Signature(s) Signed: 08/31/2015 9:31:03 AM By: Evlyn Kanner MD, FACS Entered By: Evlyn Kanner on 08/31/2015 09:31:03 Alejandro Jones ([id number]) -------------------------------------------------------------------------------- Progress Note Details Patient Name: Alejandro Jones Date of Service: 08/31/2015 8:45 AM Medical Record Number: [id number] Patient Account Number:  [id number] Date of Birth/Sex: 27-Jan-1956 (60 y.o. Male) Treating RN: Clover Mealy, RN, BSN, [place] Sink Primary Care Physician: Dennis Bast Other Clinician: Referring Physician: Treating Physician/Extender: Rudene Re in Treatment: 0 Subjective Chief Complaint Information obtained from Patient Patient seen for complaints of Non-Healing Wound all over the body including his back neck and scalp which she's had for over a year and a half. History of Present Illness (HPI) The following HPI elements were documented for the patient's wound: Location: back neck and left arm and scalp Quality: Patient reports experiencing burning to affected area(s)with a lot of pruritus and itching Severity: Patient states wound are getting worse. Duration: Patient has had the wound for >18 months prior to seeking treatment at the wound center Timing: Pain in wound is Intermittent (comes and goes Context: The wound appeared gradually over time Modifying Factors: Other treatment(s) tried include:has seen allergist and dermatologist and also several trips to the ER Associated Signs and Symptoms: Wound has significant periowound erythema and localized edema 60 year old gentleman with diabetes mellitus, who was recently in the ER for multiple wounds caused by pruritus and was asked to follow-up with his dermatologist and the [facility] where he has been seen before. I have reviewed Dr. Jannetta Quint notes from September and October of last year. He has been diagnosed by the dermatologist Dr. Merlene Pulling for neurotic excoriation with superinfection. a skin biopsy was done in the past which shows that there was inflammation and excoriation. No primary vascular findings were noted on the pathology. In the past, has been seen by Dr. Edilia Bo for peripheral vascular disease and has had a history of left eyelid extending in [place] in 2014. The patient has severe claudication and workup done in November 2016 revealed a  ABI of 1 on the right and 0.63 on the left with monophasic waveform suggesting occluded iliac artery. He is a heavy smoker smoking about 2 packs of cigarettes a day and is also known to have a past medical history significant for hypertension hyperlipidemia diabetes and cardiac disease. Wound History Patient presents with 10 open wounds that have been present for approximately years. Patient has been treating wounds in the following manner: non adherent pad. Laboratory tests have not been performed in the last month. Patient reportedly has not tested positive for an antibiotic resistant organism. Patient reportedly has not tested positive for osteomyelitis. Patient reportedly has not had testing  performed to evaluate circulation in the legs. Patient experiences the following problems associated with their wounds: infection. Alejandro Jones, Alejandro Jones ([id number]) Patient History Allergies metformin, latex, Chantix Family History Diabetes - Mother, Siblings, No family history of Cancer, Heart Disease, Hereditary Spherocytosis, Hypertension, Kidney Disease, Lung Disease, Seizures, Stroke, Thyroid Problems, Tuberculosis. Social History Smoker, current status unknown, Marital Status - Married, Alcohol Use - Never, Drug Use - No History, Caffeine Use - Moderate. Medical History Eyes Denies history of Cataracts, Glaucoma, Optic Neuritis Ear/Nose/Mouth/Throat Patient has history of Middle ear problems Denies history of Chronic sinus problems/congestion Hematologic/Lymphatic Denies history of Anemia, Hemophilia, Human Immunodeficiency Virus, Lymphedema, Sickle Cell Disease Respiratory Patient has history of Chronic Obstructive Pulmonary Disease (COPD) Denies history of Aspiration, Asthma, Pneumothorax, Sleep Apnea, Tuberculosis Cardiovascular Patient has history of Hypertension, Peripheral Venous Disease Denies history of Phlebitis Endocrine Patient has history of Type II  Diabetes Genitourinary Denies history of End Stage Renal Disease Immunological Denies history of Lupus Erythematosus, Raynaud s, Scleroderma Integumentary (Skin) Denies history of History of Burn, History of pressure wounds Neurologic Denies history of Dementia, Neuropathy, Paraplegia, Seizure Disorder Oncologic Denies history of Received Chemotherapy, Received Radiation Psychiatric Denies history of Anorexia/bulimia, Confinement Anxiety Patient is treated with Oral Agents. Blood sugar is not tested. Medical And Surgical History Notes Cardiovascular high cholesterol Musculoskeletal cervical spinal stenosis Alejandro Jones, Alejandro Jones ([id number]) Review of Systems (ROS) Constitutional Symptoms (General Health) The patient has no complaints or symptoms. Eyes The patient has no complaints or symptoms. Ear/Nose/Mouth/Throat The patient has no complaints or symptoms. Hematologic/Lymphatic The patient has no complaints or symptoms. Respiratory The patient has no complaints or symptoms. Immunological Complains or has symptoms of Hives, Itching. Integumentary (Skin) Complains or has symptoms of Wounds, Breakdown. Denies complaints or symptoms of Swelling. Neurologic The patient has no complaints or symptoms. Oncologic The patient has no complaints or symptoms. Psychiatric The patient has no complaints or symptoms. Medications: Tylenol, Proventil, aspirin, Lipitor, Delsym, Glucotrol, Atarax and Vistaril, Motrin, Lidoderm, lidocaine cream, Hyzaar, Robaxin, Prilosec, Actos, tramadol, Keflex, local ointments with TherraHoney and SureSite 123 dressing Objective Constitutional Pulse regular. Respirations normal and unlabored. Afebrile. Vitals Time Taken: 8:54 AM, Height: 72 in, Source: Stated, Weight: 223 lbs, BMI: 30.2, Temperature: 97.7  F, Pulse: 98 bpm, Respiratory Rate: 18 breaths/min, Blood Pressure: 128/88 mmHg. [place], [place] ([id number]) Eyes Nonicteric. Reactive to  light. Ears, Nose, Mouth, and Throat Lips, teeth, and gums WNL.Marland Kitchen Moist mucosa without lesions. Neck supple and nontender. No palpable supraclavicular or cervical adenopathy. Normal sized without goiter. Respiratory WNL. No retractions.. Cardiovascular Pedal Pulses WNL. No clubbing, cyanosis or edema. Gastrointestinal (GI) Abdomen without masses or tenderness.. No liver or spleen enlargement or tenderness.. Lymphatic No adneopathy. No adenopathy. No adenopathy. Musculoskeletal Adexa without tenderness or enlargement.. Digits and nails w/o clubbing, cyanosis, infection, petechiae, ischemia, or inflammatory conditions.Marland Kitchen Psychiatric Judgement and insight Intact.. No evidence of depression, anxiety, or agitation.. General Notes: multiple areas all over his back and neck scalp and left forearm which are wounds caused by excoriation of his skin due to pruritus, localized dermatitis and superinfection. No debridement has been done. Integumentary (Hair, Skin) No suspicious lesions. No crepitus or fluctuance. No peri-wound warmth or erythema. No masses.. Wound #1 status is Open. Original cause of wound was Gradually Appeared. The wound is located on the Right Neck. The wound measures 7.6cm length x 8.4cm width x 0.1cm depth; 50.14cm^2 area and 5.014cm^3 volume. The wound is limited to skin breakdown. There is no tunneling or undermining noted. There is a large amount  of purulent drainage noted. The wound margin is flat and intact. There is small (1-33%) pink granulation within the wound bed. There is a large (67-100%) amount of necrotic tissue within the wound bed including Eschar and Adherent Slough. The periwound skin appearance exhibited: Moist. The periwound skin appearance did not exhibit: Callus, Crepitus, Excoriation, Fluctuance, Friable, Induration, Localized Edema, Rash, Scarring, Dry/Scaly, Maceration, Atrophie Blanche, Cyanosis, Ecchymosis, Hemosiderin Staining, Mottled, Pallor,  Rubor, Erythema. The periwound has tenderness on palpation. Wound #2 status is Open. Original cause of wound was Gradually Appeared. The wound is located on the Right,Superior Back. The wound measures 5.8cm length x 18.2cm width x 0.1cm depth; 82.907cm^2 area and 8.291cm^3 volume. The wound is limited to skin breakdown. There is no tunneling or undermining Alejandro Jones, Alejandro Jones ([id number]) noted. There is a large amount of purulent drainage noted. The wound margin is flat and intact. There is medium (34-66%) pink granulation within the wound bed. There is a medium (34-66%) amount of necrotic tissue within the wound bed including Eschar and Adherent Slough. The periwound skin appearance exhibited: Moist. The periwound skin appearance did not exhibit: Callus, Crepitus, Excoriation, Fluctuance, Friable, Induration, Localized Edema, Rash, Scarring, Dry/Scaly, Maceration, Atrophie Blanche, Cyanosis, Ecchymosis, Hemosiderin Staining, Mottled, Pallor, Rubor, Erythema. The periwound has tenderness on palpation. Wound #3 status is Open. Original cause of wound was Gradually Appeared. The wound is located on the Midline Back. The wound measures 2.7cm length x 4.9cm width x 0.1cm depth; 10.391cm^2 area and 1.039cm^3 volume. The wound is limited to skin breakdown. There is no tunneling or undermining noted. There is a large amount of purulent drainage noted. The wound margin is flat and intact. There is large (67- 100%) pink granulation within the wound bed. There is a small (1-33%) amount of necrotic tissue within the wound bed including Adherent Slough. The periwound skin appearance did not exhibit: Callus, Crepitus, Excoriation, Fluctuance, Friable, Induration, Localized Edema, Rash, Scarring, Dry/Scaly, Maceration, Moist, Atrophie Blanche, Cyanosis, Ecchymosis, Hemosiderin Staining, Mottled, Pallor, Rubor, Erythema. The periwound has tenderness on palpation. Wound #4 status is Open. Original cause of  wound was Gradually Appeared. The wound is located on the Right Coccyx. The wound measures 5.5cm length x 3.8cm width x 0.1cm depth; 16.415cm^2 area and 1.641cm^3 volume. The wound is limited to skin breakdown. There is no tunneling or undermining noted. There is a large amount of purulent drainage noted. The wound margin is flat and intact. There is large (67- 100%) pink granulation within the wound bed. There is a small (1-33%) amount of necrotic tissue within the wound bed including Adherent Slough. The periwound skin appearance exhibited: Moist. The periwound skin appearance did not exhibit: Callus, Crepitus, Excoriation, Fluctuance, Friable, Induration, Localized Edema, Rash, Scarring, Dry/Scaly, Maceration, Atrophie Blanche, Cyanosis, Ecchymosis, Hemosiderin Staining, Mottled, Pallor, Rubor, Erythema. The periwound has tenderness on palpation. Wound #5 status is Open. Original cause of wound was Gradually Appeared. The wound is located on the Right Gluteus. The wound measures 0.5cm length x 0.9cm width x 0.1cm depth; 0.353cm^2 area and 0.035cm^3 volume. The wound is limited to skin breakdown. There is no tunneling or undermining noted. There is a large amount of purulent drainage noted. The wound margin is flat and intact. There is large (67- 100%) pink granulation within the wound bed. There is a small (1-33%) amount of necrotic tissue within the wound bed including Adherent Slough. The periwound skin appearance exhibited: Moist. The periwound skin appearance did not exhibit: Callus, Crepitus, Excoriation, Fluctuance, Friable, Induration, Localized Edema, Rash, Scarring, Dry/Scaly,  Maceration, Atrophie Blanche, Cyanosis, Ecchymosis, Hemosiderin Staining, Mottled, Pallor, Rubor, Erythema. The periwound has tenderness on palpation. Wound #6 status is Open. Original cause of wound was Gradually Appeared. The wound is located on the Left Gluteus. The wound measures 0.4cm length x 2.3cm width x  0.1cm depth; 0.723cm^2 area and 0.072cm^3 volume. The wound is limited to skin breakdown. There is no tunneling or undermining noted. There is a large amount of purulent drainage noted. The wound margin is flat and intact. There is large (67- 100%) pink granulation within the wound bed. There is a small (1-33%) amount of necrotic tissue within the wound bed including Adherent Slough. The periwound skin appearance exhibited: Moist. The periwound skin appearance did not exhibit: Callus, Crepitus, Excoriation, Fluctuance, Friable, Induration, Localized Edema, Rash, Scarring, Dry/Scaly, Maceration, Atrophie Blanche, Cyanosis, Ecchymosis, Hemosiderin Staining, Mottled, Pallor, Rubor, Erythema. The periwound has tenderness on palpation. Wound #7 status is Open. Original cause of wound was Gradually Appeared. The wound is located on the Left Forearm. The wound is limited to skin breakdown. There is no tunneling or undermining noted. There is a large amount of purulent drainage noted. The wound margin is flat and intact. There is medium (34-66%) Alejandro Jones, Alejandro Jones ([id number]) pink granulation within the wound bed. There is a medium (34-66%) amount of necrotic tissue within the wound bed including Eschar and Adherent Slough. The periwound skin appearance exhibited: Moist. The periwound skin appearance did not exhibit: Callus, Crepitus, Excoriation, Fluctuance, Friable, Induration, Localized Edema, Rash, Scarring, Dry/Scaly, Maceration, Atrophie Blanche, Cyanosis, Ecchymosis, Hemosiderin Staining, Mottled, Pallor, Rubor, Erythema. The periwound has tenderness on palpation. The patient is most disinterested in giving up smoking and I have spent about 30 minutes discussing with him the need to completely give up nicotine especially because he has multiple comorbidities and also a significant peripheral vascular disease. I do not believe he is going to be compliant. 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 Our role in the wound care center is to help mitigate some of his symptoms and help him with his dressing material for these multiple complex superficial wounds which are caused by his scratching. Home health has been using Therra- honey and SureSite 123 dressing material which has helped him. Patient and his wife fully understand that this is complex care and we are not going to be able to achieve total remission or cure him of his problem. We will be had in a supportive role to help him facilitate his dressing needs. Plan Wound Cleansing: Wound #1 Right Neck: Cleanse wound with mild soap and water Wound #2 Right,Superior Back: Cleanse wound with mild soap and water LAQUON, EMEL ([id number]) Wound #3 Midline Back: Cleanse wound with mild soap and water Wound #4 Right Coccyx: Cleanse wound with mild soap and water Wound #5 Right Gluteus: Cleanse wound with mild soap and water Wound #6 Left Gluteus: Cleanse wound with mild soap and water Wound #7 Left Forearm: Cleanse wound with mild soap and water Primary Wound Dressing: Wound #1 Right Neck: Other: - thera Honey....patient will apply at home Wound #2 Right,Superior Back: Other: - thera Honey....patient will apply at home  Wound #3 Midline Back: Other: - thera Honey....patient will apply at home Wound #4 Right Coccyx: Other: - thera Honey....patient will apply at home Wound #5 Right Gluteus: Other: - thera Honey....patient will apply at  home Wound #6 Left Gluteus: Other: - thera Honey....patient will apply at home Wound #7 Left Forearm: Other: - thera Honey....patient will apply at home Secondary Dressing: Wound #1 Right Neck: Non-adherent pad - [place] Wound #2 Right,Superior Back: Non-adherent pad - [place] Wound #3 Midline Back: Non-adherent pad - [place] Wound #4 Right Coccyx: Non-adherent pad - [place] Wound #5 Right Gluteus: Non-adherent pad - [place] Wound #6 Left Gluteus: Non-adherent pad - [place] Wound #7 Left Forearm: Non-adherent pad - [place] Dressing Change Frequency: Wound #1 Right Neck: Change dressing every day. Wound #2 Right,Superior Back: Change dressing every day. Wound #3 Midline Back: Change dressing every day. Wound #4 Right Coccyx: Change dressing every day. Wound #5 Right Gluteus: Change dressing every day. [place], [place]ley Plan ([id number]) Wound #6 Left Gluteus: Change dressing every day. Wound #7 Left Forearm: Change dressing every day. Follow-up Appointments: Wound #1 Right Neck: Return Appointment in 1 month Wound #2 Right,Superior Back: Return Appointment in 1 month Wound #3 Midline Back: Return Appointment in 1 month Wound #4 Right Coccyx: Return Appointment in 1 month Wound #5 Right Gluteus: Return Appointment in 1 month Wound #6 Left Gluteus: Return Appointment in 1 month Wound #7 Left Forearm: Return Appointment in 1 month Additional Orders / Instructions: Wound #1 Right Neck: Stop Smoking Wound #2 Right,Superior Back: Stop Smoking Wound #3 Midline Back: Stop Smoking Wound #4 Right Coccyx: Stop Smoking Wound #5 Right Gluteus: Stop Smoking Wound #6 Left Gluteus: Stop Smoking Wound #7 Left Forearm: Stop Smoking Home Health: Wound #1 Right Neck: Initiate Home [facility] Continue Home Health Visits - Home health to order all patient supplies. Home Health Nurse may visit PRN to  address patient s wound care needs. FACE TO FACE ENCOUNTER: MEDICARE and MEDICAID PATIENTS: I certify that this patient is under my care and that I had a face-to-face encounter that meets the physician face-to-face encounter requirements with this patient on this date. The encounter with the patient was in whole or in part for the following MEDICAL CONDITION: (primary reason for Home Healthcare) MEDICAL NECESSITY: I certify, that based on my findings, NURSING services are a medically necessary home health service. HOME BOUND STATUS: I certify that my clinical findings support that this patient is homebound (i.e., Due to illness or injury, pt requires aid of supportive devices such as crutches, cane, wheelchairs, walkers, the use of special transportation or the assistance of another person to leave their place of residence. There is a normal inability to leave the home and doing so requires considerable and taxing effort. Other absences are for medical reasons / religious services and are infrequent or of short duration when for other reasons). If current dressing causes regression in wound condition, may D/C ordered dressing product/s and apply Normal Saline Moist Dressing daily until next Wound Healing Center / Other MD appointment. Notify Wound Alejandro Jones, Alejandro Jones ([id number]) Healing Center of regression in wound condition at [phone]. Wound #2 Right,Superior Back: Initiate Home Health for [facility] Continue Home Health Visits - Home health to order all patient supplies. Home Health Nurse may visit PRN to address patient s wound care needs. FACE TO FACE ENCOUNTER: MEDICARE and MEDICAID PATIENTS: I certify that this  patient is under my care and that I had a face-to-face encounter that meets the physician face-to-face encounter requirements with this patient on this date. The encounter with the patient was in whole or in part for the following MEDICAL  CONDITION: (primary reason for Home Healthcare) MEDICAL NECESSITY: I certify, that based on my findings, NURSING services are a medically necessary home health service. HOME BOUND STATUS: I certify that my clinical findings support that this patient is homebound (i.e., Due to illness or injury, pt requires aid of supportive devices such as crutches, cane, wheelchairs, walkers, the use of special transportation or the assistance of another person to leave their place of residence. There is a normal inability to leave the home and doing so requires considerable and taxing effort. Other absences are for medical reasons / religious services and are infrequent or of short duration when for other reasons). If current dressing causes regression in wound condition, may D/C ordered dressing product/s and apply Normal Saline Moist Dressing daily until next Wound Healing Center / Other MD appointment. Notify Wound Healing Center of regression in wound condition at [phone]. Wound #3 Midline Back: Initiate Home [facility] Continue Home Health Visits - Home health to order all patient supplies. Home Health Nurse may visit PRN to address patient s wound care needs. FACE TO FACE ENCOUNTER: MEDICARE and MEDICAID PATIENTS: I certify that this patient is under my care and that I had a face-to-face encounter that meets the physician face-to-face encounter requirements with this patient on this date. The encounter with the patient was in whole or in part for the following MEDICAL CONDITION: (primary reason for Home Healthcare) MEDICAL NECESSITY: I certify, that based on my findings, NURSING services are a medically necessary home health service. HOME BOUND STATUS: I certify that my clinical findings support that this patient is homebound (i.e., Due to illness or injury, pt requires aid of supportive devices such as crutches, cane, wheelchairs, walkers, the use of special  transportation or the assistance of another person to leave their place of residence. There is a normal inability to leave the home and doing so requires considerable and taxing effort. Other absences are for medical reasons / religious services and are infrequent or of short duration when for other reasons). If current dressing causes regression in wound condition, may D/C ordered dressing product/s and apply Normal Saline Moist Dressing daily until next Wound Healing Center / Other MD appointment. Notify Wound Healing Center of regression in wound condition at [phone]. Wound #4 Right Coccyx: Initiate Home Health for [facility] Continue Home Health Visits - Home health to order all patient supplies. Home Health Nurse may visit PRN to address patient s wound care needs. FACE TO FACE ENCOUNTER: MEDICARE and MEDICAID PATIENTS: I certify that this patient is under my care and that I had a face-to-face encounter that meets the physician face-to-face encounter requirements with this patient on this date. The encounter with the patient was in whole or in part for the following MEDICAL CONDITION: (primary reason for Home Healthcare) MEDICAL NECESSITY: I certify, that based on my findings, NURSING services are a medically necessary home health service. HOME BOUND STATUS: I certify that my clinical findings support that this patient is homebound (i.e., Due to illness or injury, pt requires aid of supportive devices such as crutches, cane, wheelchairs, walkers, the use of special transportation or the assistance of another person to leave their place  of residence. There is a normal inability to leave the home and doing so requires considerable and taxing effort. Other absences are for medical reasons / religious services and are infrequent or of short duration when for other reasons). If current dressing causes regression in wound condition, may D/C ordered dressing  product/s and apply Normal Saline Moist Dressing daily until next Wound Healing Center / Other MD appointment. Notify Wound Alejandro Jones, Alejandro Jones ([id number]) Healing Center of regression in wound condition at [phone]. Wound #5 Right Gluteus: Initiate Home Health [facility] Continue Home Health Visits - Home health to order all patient supplies. Home Health Nurse may visit PRN to address patient s wound care needs. FACE TO FACE ENCOUNTER: MEDICARE and MEDICAID PATIENTS: I certify that this patient is under my care and that I had a face-to-face encounter that meets the physician face-to-face encounter requirements with this patient on this date. The encounter with the patient was in whole or in part for the following MEDICAL CONDITION: (primary reason for Home Healthcare) MEDICAL NECESSITY: I certify, that based on my findings, NURSING services are a medically necessary home health service. HOME BOUND STATUS: I certify that my clinical findings support that this patient is homebound (i.e., Due to illness or injury, pt requires aid of supportive devices such as crutches, cane, wheelchairs, walkers, the use of special transportation or the assistance of another person to leave their place of residence. There is a normal inability to leave the home and doing so requires considerable and taxing effort. Other absences are for medical reasons / religious services and are infrequent or of short duration when for other reasons). If current dressing causes regression in wound condition, may D/C ordered dressing product/s and apply Normal Saline Moist Dressing daily until next Wound Healing Center / Other MD appointment. Notify Wound Healing Center of regression in wound condition at [phone]. Wound #6 Left Gluteus: Initiate Home Health [facility] Continue Home Health Visits - Home health to order all patient supplies. Home Health  Nurse may visit PRN to address patient s wound care needs. FACE TO FACE ENCOUNTER: MEDICARE and MEDICAID PATIENTS: I certify that this patient is under my care and that I had a face-to-face encounter that meets the physician face-to-face encounter requirements with this patient on this date. The encounter with the patient was in whole or in part for the following MEDICAL CONDITION: (primary reason for Home Healthcare) MEDICAL NECESSITY: I certify, that based on my findings, NURSING services are a medically necessary home health service. HOME BOUND STATUS: I certify that my clinical findings support that this patient is homebound (i.e., Due to illness or injury, pt requires aid of supportive devices such as crutches, cane, wheelchairs, walkers, the use of special transportation or the assistance of another person to leave their place of residence. There is a normal inability to leave the home and doing so requires considerable and taxing effort. Other absences are for medical reasons / religious services and are infrequent or of short duration when for other reasons). If current dressing causes regression in wound condition, may D/C ordered dressing product/s and apply Normal Saline Moist Dressing daily until next Wound Healing Center / Other MD appointment. Notify Wound Healing Center of regression in wound condition at [phone]. Wound #7 Left Forearm: Initiate Home Health [facility] Continue Home Health Visits - Home health to order all patient supplies. Home Health Nurse  may visit PRN to address patient s wound care needs. FACE TO FACE ENCOUNTER: MEDICARE and MEDICAID PATIENTS: I certify that this patient is under my care and that I had a face-to-face encounter that meets the physician face-to-face encounter requirements with this patient on this date. The encounter with the patient was in whole or in part for the following MEDICAL CONDITION: (primary  reason for Home Healthcare) MEDICAL NECESSITY: I certify, that based on my findings, NURSING services are a medically necessary home health service. HOME BOUND STATUS: I certify that my clinical findings support that this patient is homebound (i.e., Due to illness or injury, pt requires aid of supportive devices such as crutches, cane, wheelchairs, walkers, the use of special transportation or the assistance of another person to leave their place of residence. There is a normal inability to leave the home and doing so requires considerable and taxing effort. Other absences are for medical reasons / religious services and are infrequent or of short duration when for other reasons). If current dressing causes regression in wound condition, may D/C ordered dressing product/s and apply Normal Saline Moist Dressing daily until next Wound Healing Center / Other MD appointment. Notify Wound Alejandro Jones, Alejandro Jones ([id number]) Healing Center of regression in wound condition at [phone]. 60 year old gentleman who has diabetes mellitus as a baseline which is controlled by his primary care physician has been worked up thoroughly by dermatology and allergist for his extensive pruritus with excoriation all over his body which causes secondary infected wounds. Our role in the wound care center is to help mitigate some of his symptoms and help him with his dressing material for these multiple complex superficial wounds which are caused by his scratching. Home health has been using Therra- honey and SureSite 123 dressing material which has helped him. Patient and his wife fully understand that this is complex care and we are not going to be able to achieve total remission or cure him of his problem. We will be had in a supportive role to help him facilitate his dressing needs. Electronic Signature(s) Signed: 09/01/2015 10:45:22 AM By: Evlyn Kanner MD, FACS Previous Signature: 08/31/2015 9:40:04 AM Version By:  Evlyn Kanner MD, FACS Previous Signature: 08/31/2015 9:38:42 AM Version By: Evlyn Kanner MD, FACS Entered By: Evlyn Kanner on 09/01/2015 10:45:22 Alejandro Jones ([id number]) -------------------------------------------------------------------------------- ROS/PFSH Details Patient Name: Alejandro Jones Date of Service: 08/31/2015 8:45 AM Medical Record Number: [id number] Patient Account Number: [id number] Date of Birth/Sex: Jun 25, 1956 (60 y.o. Male) Treating RN: Clover Mealy, RN, BSN, [place] Primary Care Physician: Dennis Bast Other Clinician: Referring Physician: Treating Physician/Extender: Rudene Re in Treatment: 0 Wound History Do you currently have one or more open woundso Yes How many open wounds do you currently haveo 10 Approximately how long have you had your woundso years How have you been treating your wound(s) until nowo non adherent pad Has your wound(s) ever healed and then re-openedo No Have you had any lab work done in the past montho No Have you tested positive for an antibiotic resistant organism (MRSA, VRE)o No Have you tested positive for osteomyelitis (bone infection)o No Have you had any tests for circulation on your legso No Have you had other problems associated with your woundso Infection Hematologic/Lymphatic Complaints and Symptoms: No Complaints or Symptoms Complaints and Symptoms: Negative for: Bleeding / Clotting Disorders; Human Immunodeficiency Virus Medical History: Negative for: Anemia; Hemophilia; Human Immunodeficiency Virus; Lymphedema; Sickle Cell Disease Immunological Complaints and Symptoms: Positive for: Hives; Itching Medical History: Negative for: Lupus Erythematosus; Raynaudos;  Scleroderma Integumentary (Skin) Complaints and Symptoms: Positive for: Wounds; Breakdown Negative for: Swelling Medical History: Negative for: History of Burn; History of pressure wounds Constitutional Symptoms (General Health) Alejandro Jones, Alejandro Jones  ([id number]) Complaints and Symptoms: No Complaints or Symptoms Eyes Complaints and Symptoms: No Complaints or Symptoms Medical History: Negative for: Cataracts; Glaucoma; Optic Neuritis Ear/Nose/Mouth/Throat Complaints and Symptoms: No Complaints or Symptoms Medical History: Positive for: Middle ear problems Negative for: Chronic sinus problems/congestion Respiratory Complaints and Symptoms: No Complaints or Symptoms Medical History: Positive for: Chronic Obstructive Pulmonary Disease (COPD) Negative for: Aspiration; Asthma; Pneumothorax; Sleep Apnea; Tuberculosis Cardiovascular Medical History: Positive for: Hypertension; Peripheral Venous Disease Negative for: Phlebitis Past Medical History Notes: high cholesterol Endocrine Medical History: Positive for: Type II Diabetes Time with diabetes: 15years Treated with: Oral agents Blood sugar tested every day: No Genitourinary Medical History: Negative for: End Stage Renal Disease Musculoskeletal Alejandro Jones, RIERA ([id number]) Medical History: Past Medical History Notes: cervical spinal stenosis Neurologic Complaints and Symptoms: No Complaints or Symptoms Medical History: Negative for: Dementia; Neuropathy; Paraplegia; Seizure Disorder Oncologic Complaints and Symptoms: No Complaints or Symptoms Medical History: Negative for: Received Chemotherapy; Received Radiation Psychiatric Complaints and Symptoms: No Complaints or Symptoms Medical History: Negative for: Anorexia/bulimia; Confinement Anxiety HBO Extended History Items Ear/Nose/Mouth/Throat: Middle ear problems Family and Social History Cancer: No; Diabetes: Yes - Mother, Siblings; Heart Disease: No; Hereditary Spherocytosis: No; Hypertension: No; Kidney Disease: No; Lung Disease: No; Seizures: No; Stroke: No; Thyroid Problems: No; Tuberculosis: No; Smoker, current status unknown; Marital Status - Married; Alcohol Use: Never; Drug Use: No History;  Caffeine Use: Moderate; Financial Concerns: No; Food, Clothing or Shelter Needs: No; Support System Lacking: No; Transportation Concerns: No; Advanced Directives: No; Patient does not want information on Advanced Directives; Living Will: No Physician Affirmation I have reviewed and agree with the above information. Electronic Signature(s) Signed: 08/31/2015 9:34:03 AM By: Evlyn Kanner MD, FACS Signed: 08/31/2015 4:55:10 PM By: Elpidio Eric BSN, RN Entered By: Evlyn Kanner on 08/31/2015 09:34:03 Alejandro Jones ([id number]) -------------------------------------------------------------------------------- SuperBill Details Patient Name: Alejandro Jones Date of Service: 08/31/2015 Medical Record Number: [id number] Patient Account Number: [id number] Date of Birth/Sex: [birth year] (60 y.o. 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 L28.0 Lichen simplex chronicus L29.9 Pruritus, unspecified F17.218 Nicotine dependence, cigarettes, with other nicotine-induced disorders Facility Procedures CPT4 Code Description: [id number] 99406-SMOKING CESSATION 3-10MINS ICD-10 Description Diagnosis F17.218 Nicotine dependence, cigarettes, with other nicoti Modifier: ne-induced di Quantity: 1 sorders Physician Procedures CPT4 Code Description: [id number] - WC PHYS LEVEL 4 - EST PT ICD-10 Description Diagnosis 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

## 2015-09-01 NOTE — Progress Notes (Signed)
DOMINIC, MAHANEY ([id number]) Visit Report for 08/31/2015 Abuse/Suicide Risk Screen Details Patient Name: Alejandro Jones, Alejandro Jones Date of Service: 08/31/2015 8:45 AM Medical Record Number: [id number] Patient Account Number: [id number] Date of Birth/Sex: 04/21/1956 (60 y.o. Male) Treating RN: Clover Mealy, RN, BSN, [place] Sink Primary Care Physician: Dennis Bast Other Clinician: Referring Physician: Treating Physician/Extender: Rudene Re in Treatment: 0 Abuse/Suicide Risk Screen Items Answer ABUSE/SUICIDE RISK SCREEN: Has anyone close to you tried to hurt or harm you recentlyo No Do you feel uncomfortable with anyone in your familyo No Has anyone forced you do things that you didnot want to doo No Do you have any thoughts of harming yourselfo No Patient displays signs or symptoms of abuse and/or neglect. No Electronic Signature(s) Signed: 08/31/2015 4:55:10 PM By: Elpidio Eric BSN, RN Entered By: Elpidio Eric on 08/31/2015 08:58:12 Alejandro Jones ([id number]) -------------------------------------------------------------------------------- Activities of Daily Living Details Patient Name: Alejandro Jones Date of Service: 08/31/2015 8:45 AM Medical Record Number: [id number] Patient Account Number: [id number] Date of Birth/Sex: 20-May-1956 (60 y.o. Male) Treating RN: Clover Mealy, RN, BSN, [place] Sink Primary Care Physician: Dennis Bast Other Clinician: Referring Physician: Treating Physician/Extender: Rudene Re in Treatment: 0 Activities of Daily Living Items Answer Activities of Daily Living (Please select one for each item) Drive Automobile Need Assistance Take Medications Need Assistance Use Telephone Need Assistance Care for Appearance Need Assistance Use Toilet Need Assistance Bath / Shower Need Assistance Dress Self Need Assistance Feed Self Need Assistance Walk Need Assistance Get In / Out Bed Need Assistance Housework Need Assistance Prepare Meals Need Assistance Handle Money  Need Assistance Shop for Self Need Assistance Electronic Signature(s) Signed: 08/31/2015 4:55:10 PM By: Elpidio Eric BSN, RN Entered By: Elpidio Eric on 08/31/2015 09:08:05 Alejandro Jones ([id number]) -------------------------------------------------------------------------------- Education Assessment Details Patient Name: Alejandro Jones Date of Service: 08/31/2015 8:45 AM Medical Record Number: [id number] Patient Account Number: [id number] Date of Birth/Sex: May 04, 1956 (60 y.o. Male) Treating RN: Clover Mealy, RN, BSN, [place] Sink Primary Care Physician: Dennis Bast Other Clinician: Referring Physician: Treating Physician/Extender: Rudene Re in Treatment: 0 Primary Learner Assessed: Patient Learning Preferences/Education Level/Primary Language Learning Preference: Explanation Highest Education Level: Grade School Preferred Language: English Cognitive Barrier Assessment/Beliefs Language Barrier: No Physical Barrier Assessment Impaired Vision: No Impaired Hearing: No Decreased Hand dexterity: No Knowledge/Comprehension Assessment Knowledge Level: Medium Comprehension Level: Medium Ability to understand written Medium instructions: Ability to understand verbal Medium instructions: Motivation Assessment Anxiety Level: Calm Cooperation: Cooperative Education Importance: Acknowledges Need Interest in Health Problems: Asks Questions Perception: Coherent Willingness to Engage in Self- Medium Management Activities: Readiness to Engage in Self- Medium Management Activities: Electronic Signature(s) Signed: 08/31/2015 4:55:10 PM By: Elpidio Eric BSN, RN Entered By: Elpidio Eric on 08/31/2015 08:59:30 Alejandro Jones ([id number]) -------------------------------------------------------------------------------- Fall Risk Assessment Details Patient Name: Alejandro Jones Date of Service: 08/31/2015 8:45 AM Medical Record Number: [id number] Patient Account Number:  [id number] Date of Birth/Sex: [birth year] (60 y.o. Male) Treating RN: Clover Mealy, RN, BSN, [place] Sink Primary Care Physician: Dennis Bast Other Clinician: Referring Physician: Treating Physician/Extender: Rudene Re in Treatment: 0 Fall Risk Assessment Items Have you had 2 or more falls in the last 12 monthso 0 Yes Have you had any fall that resulted in injury in the last 12 monthso 0 No FALL RISK ASSESSMENT: History of falling - immediate or within 3 months 25 Yes Secondary diagnosis 0 No Ambulatory aid None/bed rest/wheelchair/nurse 0 Yes Crutches/cane/walker 0 No Furniture 0 No IV Access/Saline Lock 0 No Gait/Training Normal/bed rest/immobile 0 Yes Weak 0 No Impaired 0 No Mental Status Oriented to own  ability 0 Yes Electronic Signature(s) Signed: 08/31/2015 4:55:10 PM By: Elpidio Eric BSN, RN Entered By: Elpidio Eric on 08/31/2015 08:59:02 Alejandro Jones ([id number]) -------------------------------------------------------------------------------- Foot Assessment Details Patient Name: Alejandro Jones Date of Service: 08/31/2015 8:45 AM Medical Record Number: [id number] Patient Account Number: [id number] Date of Birth/Sex: 08/16/1955 (60 y.o. Male) Treating RN: Clover Mealy, RN, BSN, [place] Sink Primary Care Physician: Dennis Bast Other Clinician: Referring Physician: Treating Physician/Extender: Rudene Re in Treatment: 0 Foot Assessment Items Site Locations + = Sensation present, - = Sensation absent, C = Callus, U = Ulcer R = Redness, W = Warmth, M = Maceration, PU = Pre-ulcerative lesion F = Fissure, S = Swelling, D = Dryness Assessment Right: Left: Other Deformity: No No Prior Foot Ulcer: No No Prior Amputation: No No Charcot Joint: No No Ambulatory Status: Ambulatory Without Help Gait: Steady Electronic Signature(s) Signed: 08/31/2015 4:55:10 PM By: Elpidio Eric BSN, RN Entered By: Elpidio Eric on 08/31/2015 08:58:27 Alejandro Jones  ([id number]) -------------------------------------------------------------------------------- Nutrition Risk Assessment Details Patient Name: Alejandro Jones Date of Service: 08/31/2015 8:45 AM Medical Record Number: [id number] Patient Account Number: [id number] Date of Birth/Sex: 1956-03-04 (60 y.o. Male) Treating RN: Clover Mealy, RN, BSN, [place] Primary Care Physician: Dennis Bast Other Clinician: Referring Physician: Treating Physician/Extender: Rudene Re in Treatment: 0 Height (in): 72 Weight (lbs): 223 Body Mass Index (BMI): 30.2 Nutrition Risk Assessment Items NUTRITION RISK SCREEN: I have an illness or condition that made me change the kind and/or 0 No amount of food I eat I eat fewer than two meals per day 0 No I eat few fruits and vegetables, or milk products 0 No I have three or more drinks of beer, liquor or wine almost every day 0 No I have tooth or mouth problems that make it hard for me to eat 0 No I don't always have enough money to buy the food I need 0 No I eat alone most of the time 0 No I take three or more different prescribed or over-the-counter drugs a 0 No day Without wanting to, I have lost or gained 10 pounds in the last six 0 No months I am not always physically able to shop, cook and/or feed myself 0 No Nutrition Protocols Good Risk Protocol 0 No interventions needed Moderate Risk Protocol Electronic Signature(s) Signed: 08/31/2015 4:55:10 PM By: Elpidio Eric BSN, RN Entered By: Elpidio Eric on 08/31/2015 08:58:35

## 2015-09-01 NOTE — Progress Notes (Addendum)
Alejandro Jones ([id number]) Visit Report for 08/31/2015 Allergy List Details Patient Name: Alejandro Jones Date of Service: 08/31/2015 8:45 AM Medical Record Number: [id number] Patient Account Number: [id number] Date of Birth/Sex: [birth year] (60 y.o. Male) Treating RN: Clover Mealy, RN, BSN, [place] Sink Primary Care Physician: Dennis Bast Other Clinician: Referring Physician: Treating Physician/Extender: Rudene Re in Treatment: 0 Allergies Active Allergies metformin latex Chantix Allergy Notes Electronic Signature(s) Signed: 08/31/2015 4:55:10 PM By: Elpidio Eric BSN, RN Entered By: Elpidio Eric on 08/31/2015 09:11:07 Alejandro Jones ([id number]) -------------------------------------------------------------------------------- Arrival Information Details Patient Name: Alejandro Jones Date of Service: 08/31/2015 8:45 AM Medical Record Number: [id number] Patient Account Number: [id number] Date of Birth/Sex: 08-31-1955 (60 y.o. Male) Treating RN: Clover Mealy, RN, BSN, [place] Sink Primary Care Physician: Dennis Bast Other Clinician: Referring Physician: Treating Physician/Extender: Rudene Re in Treatment: 0 Visit Information Patient Arrived: Ambulatory Arrival Time: 08:48 Accompanied By: wife Transfer Assistance: None Patient Identification Verified: Yes Secondary Verification Process Yes Completed: Patient Requires Transmission- No Based Precautions: Patient Has Alerts: Yes Patient Alerts: Patient on Blood Thinner Aspirin Electronic Signature(s) Signed: 08/31/2015 4:55:10 PM By: Elpidio Eric BSN, RN Entered By: Elpidio Eric on 08/31/2015 08:53:37 Alejandro Jones ([id number]) -------------------------------------------------------------------------------- Clinic Level of Care Assessment Details Patient Name: Alejandro Jones Date of Service: 08/31/2015 8:45 AM Medical Record Number: [id number] Patient Account Number: [id number] Date of Birth/Sex: [birth year] (60 y.o.  Male) Treating RN: Clover Mealy, RN, BSN, [place] Sink Primary Care Physician: Dennis Bast Other Clinician: Referring Physician: Treating Physician/Extender: Rudene Re in Treatment: 0 Clinic Level of Care Assessment Items TOOL 2 Quantity Score []  - Use when only an EandM is performed on the INITIAL visit 0 ASSESSMENTS - Nursing Assessment / Reassessment X - General Physical Exam (combine w/ comprehensive assessment (listed just 1 20 below) when performed on new pt. evals) X - Comprehensive Assessment (HX, ROS, Risk Assessments, Wounds Hx, etc.) 1 25 ASSESSMENTS - Wound and Skin Assessment / Reassessment []  - Simple Wound Assessment / Reassessment - one wound 0 X - Complex Wound Assessment / Reassessment - multiple wounds 7 5 []  - Dermatologic / Skin Assessment (not related to wound area) 0 ASSESSMENTS - Ostomy and/or Continence Assessment and Care []  - Incontinence Assessment and Management 0 []  - Ostomy Care Assessment and Management (repouching, etc.) 0 PROCESS - Coordination of Care X - Simple Patient / Family Education for ongoing care 1 15 []  - Complex (extensive) Patient / Family Education for ongoing care 0 X - Staff obtains Chiropractor, Records, Test Results / Process Orders 1 10 X - Staff telephones HHA, Nursing Homes / Clarify orders / etc 1 10 []  - Routine Transfer to another Facility (non-emergent condition) 0 []  - Routine Hospital Admission (non-emergent condition) 0 X - New Admissions / Manufacturing engineer / Ordering NPWT, Apligraf, etc. 1 15 []  - Emergency Hospital Admission (emergent condition) 0 []  - Simple Discharge Coordination 0 Alejandro Jones ([id number]) []  - Complex (extensive) Discharge Coordination 0 PROCESS - Special Needs []  - Pediatric / Minor Patient Management 0 []  - Isolation Patient Management 0 []  - Hearing / Language / Visual special needs 0 []  - Assessment of Community assistance (transportation, D/C planning, etc.) 0 []  - Additional  assistance / Altered mentation 0 []  - Support Surface(s) Assessment (bed, cushion, seat, etc.) 0 INTERVENTIONS - Wound Cleansing / Measurement X - Wound Imaging (photographs - any number of wounds) 1 5 []  - Wound Tracing (instead of photographs) 0 []  - Simple Wound Measurement - one wound 0 X - Complex Wound Measurement - multiple wounds  7 5  - Simple Wound Cleansing - one wound 0 X - Complex Wound Cleansing - multiple wounds 7 5 INTERVENTIONS - Wound Dressings  - Small Wound Dressing one or multiple wounds 0 X - Medium Wound Dressing one or multiple wounds 7 15  - Large Wound Dressing one or multiple wounds 0  - Application of Medications - injection 0 INTERVENTIONS - Miscellaneous  - External ear exam 0  - Specimen Collection (cultures, biopsies, blood, body fluids, etc.) 0  - Specimen(s) / Culture(s) sent or taken to Lab for analysis 0  - Patient Transfer (multiple staff / Michiel Sites Lift / Similar devices) 0  - Simple Staple / Suture removal (25 or less) 0  - Complex Staple / Suture removal (26 or more) 0 Alejandro Jones ([id number])  - Hypo / Hyperglycemic Management (close monitor of Blood Glucose) 0  - Ankle / Brachial Index (ABI) - do not check if billed separately 0 Has the patient been seen at the hospital within the last three years: Yes Total Score: 310 Level Of Care: New/Established - Level 5 Electronic Signature(s) Signed: 08/31/2015 4:55:10 PM By: Elpidio Eric BSN, RN Entered By: Elpidio Eric on 08/31/2015 09:44:29 Alejandro Jones ([id number]) -------------------------------------------------------------------------------- Encounter Discharge Information Details Patient Name: Alejandro Jones Date of Service: 08/31/2015 8:45 AM Medical Record Number: [id number] Patient Account Number: [id number] Date of Birth/Sex: 1956-02-04 (60 y.o. Male) Treating RN: Clover Mealy, RN, BSN, [place] Sink Primary Care Physician: Dennis Bast Other Clinician: Referring  Physician: Treating Physician/Extender: Rudene Re in Treatment: 0 Encounter Discharge Information Items Discharge Pain Level: 0 Discharge Condition: Stable Ambulatory Status: Ambulatory Discharge Destination: Home Transportation: Private Auto Accompanied By: wife Schedule Follow-up Appointment: No Medication Reconciliation completed and provided to Patient/Care No Brahm Barbeau: Provided on Clinical Summary of Care: 08/31/2015 Form Type Recipient Paper Patient RW Electronic Signature(s) Signed: 08/31/2015 9:49:12 AM By: Gwenlyn Perking Entered By: Gwenlyn Perking on 08/31/2015 09:49:12 Alejandro Jones ([id number]) -------------------------------------------------------------------------------- Lower Extremity Assessment Details Patient Name: Alejandro Jones Date of Service: 08/31/2015 8:45 AM Medical Record Number: [id number] Patient Account Number: [id number] Date of Birth/Sex: 1956/06/27 (60 y.o. Male) Treating RN: Clover Mealy, RN, BSN, [place] Sink Primary Care Physician: Dennis Bast Other Clinician: Referring Physician: Treating Physician/Extender: Rudene Re in Treatment: 0 Electronic Signature(s) Signed: 08/31/2015 4:55:10 PM By: Elpidio Eric BSN, RN Entered By: Elpidio Eric on 08/31/2015 09:08:24 Alejandro Jones ([id number]) -------------------------------------------------------------------------------- Multi Wound Chart Details Patient Name: Alejandro Jones Date of Service: 08/31/2015 8:45 AM Medical Record Number: [id number] Patient Account Number: [id number] Date of Birth/Sex: Dec 30, 1955 (60 y.o. 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] [2:Auto-immune] [3:Auto-immune] Comorbid History: [1:Middle ear problems, Chronic Obstructive Pulmonary Disease (COPD), Hypertension, Peripheral Venous Disease, Type II Diabetes] [2:Middle ear problems, Chronic Obstructive Pulmonary Disease (COPD), Hypertension, Peripheral Venous  Disease, Type II Diabetes] [3:Middle ear problems, Chronic Obstructive Pulmonary Disease (COPD), Hypertension, Peripheral Venous Disease, Type II Diabetes] Date Acquired: [1:03/12/2015] [2:03/12/2015] [3:03/12/2015] Weeks of Treatment: [1:0] [2:0] [3:0] Wound Status: [1:Open] [2:Open] [3:Open] Clustered Wound: [1:No] [2:Yes] [3:No] Measurements L x W x D 7.6x8.4x0.1 [2:5.8x18.2x0.1] [3:2.7x4.9x0.1] (cm) Area (cm) : [1:50.14] [2:82.907] [3:10.391] Volume (cm) : [1:5.014] [2:8.291] [  3:1.039] % Reduction in Area: [1:0.00%] [2:0.00%] [3:0.00%] % Reduction in Volume: 0.00% [2:0.00%] [3:0.00%] Classification: [1:Partial Thickness] [2:Partial Thickness] [3:Partial Thickness] Exudate Amount: [1:Large] [2:Large] [3:Large] Exudate Type: [1:Purulent] [2:Purulent] [3:Purulent] Exudate Color: [1:yellow, brown, green] [2:yellow, brown, green] [3:yellow, brown, green] Wound Margin: [1:Flat and Intact] [2:Flat and Intact] [3:Flat and Intact] Granulation Amount: [1:Small (1-33%)] [2:Medium (34-66%)] [3:Large (67-100%)] Granulation Quality: [1:Pink] [2:Pink] [3:Pink] Necrotic Amount: [1:Large (67-100%)] [2:Medium (34-66%)] [3:Small (1-33%)] Necrotic Tissue: [1:Eschar, Adherent Slough] [2:Eschar, Adherent Slough] [3:Adherent Slough] Exposed Structures: CAMDEN, KNOTEK ([id number]) Fascia: No Fascia: No Fascia: No Fat: No Fat: No Fat: No Tendon: No Tendon: No Tendon: No Muscle: No Muscle:  No Muscle: No Joint: No Joint: No Joint: No Bone: No Bone: No Bone: No Limited to Skin Limited to Skin Limited to Skin Breakdown Breakdown Breakdown Epithelialization: None None None Periwound Skin Texture: Edema: No Edema: No Edema: No Excoriation: No Excoriation: No Excoriation: No Induration: No Induration: No Induration: No Callus: No Callus: No Callus: No Crepitus: No Crepitus: No Crepitus: No Fluctuance: No Fluctuance: No Fluctuance: No Friable: No Friable: No Friable: No Rash: No Rash: No Rash: No Scarring: No Scarring: No Scarring: No Periwound Skin Moist: Yes Moist: Yes Maceration: No Moisture: Maceration: No Maceration: No Moist: No Dry/Scaly: No Dry/Scaly: No Dry/Scaly: No Periwound Skin Color: Atrophie Blanche: No Atrophie Blanche: No Atrophie Blanche: No Cyanosis: No Cyanosis: No Cyanosis: No Ecchymosis: No Ecchymosis: No Ecchymosis: No Erythema: No Erythema: No Erythema: No Hemosiderin Staining: No Hemosiderin Staining: No Hemosiderin Staining: No Mottled: No Mottled: No Mottled: No Pallor: No Pallor: No Pallor: No Rubor: No Rubor: No Rubor: No Tenderness on Yes Yes Yes Palpation: Wound Preparation: Ulcer Cleansing: Ulcer Cleansing: Ulcer Cleansing: Rinsed/Irrigated with Rinsed/Irrigated with Rinsed/Irrigated with Saline Saline Saline Topical Anesthetic Topical Anesthetic Topical Anesthetic Applied: None Applied: None Applied: None Wound Number: [id number] 5 6  Photos: No Photos No Photos No Photos Wound Location: Right Coccyx Right Gluteus Left Gluteus Wounding Event: Gradually Appeared Gradually Appeared Gradually Appeared Primary Etiology: Auto-immune Auto-immune Auto-immune Comorbid History: Middle ear problems, Middle ear problems, Middle ear problems, Chronic Obstructive Chronic Obstructive Chronic Obstructive Pulmonary Disease Pulmonary Disease Pulmonary Disease (COPD), Hypertension, (COPD), Hypertension, (COPD),  Hypertension, Peripheral Venous Peripheral Venous Peripheral Venous Disease, Type II Diabetes Disease, Type II Diabetes Disease, Type II Diabetes Date Acquired: 03/12/2015 03/12/2015 03/12/2015 Alejandro Jones ([id number]) Weeks of Treatment: 0 0 0 Wound Status: Open Open Open Clustered Wound: No No No Measurements L x W x D 5.5x3.8x0.1 0.5x0.9x0.1 0.4x2.3x0.1 (cm) Area (cm) : 16.415 0.353 0.723 Volume (cm) : 1.641 0.035 0.072 % Reduction in Area: 0.00% 0.00% 0.00% % Reduction in Volume: 0.00% 0.00% 0.00% Classification: Partial Thickness Partial Thickness Partial Thickness Exudate Amount: Large Large Large Exudate Type: Purulent Purulent Purulent Exudate Color: yellow, brown, green yellow, brown, green yellow, brown, green Wound Margin: Flat and Intact Flat and Intact Flat and Intact Granulation Amount: Large (67-100%) Large (67-100%) Large (67-100%) Granulation Quality: Pink Pink Pink Necrotic Amount: Small (1-33%) Small (1-33%) Small (1-33%) Necrotic Tissue: Adherent Slough Adherent [employer] Exposed Structures: Fascia: No Fascia: No Fascia: No Fat: No Fat: No Fat: No Tendon: No Tendon: No Tendon: No Muscle: No Muscle: No Muscle: No Joint: No Joint: No Joint: No Bone: No Bone: No Bone: No Limited to Skin Limited to Skin Limited to Skin Breakdown Breakdown Breakdown Epithelialization: None None None Periwound Skin Texture: Edema: No Edema: No Edema: No Excoriation: No Excoriation: No Excoriation: No Induration: No Induration: No Induration: No Callus: No Callus:  No Callus: No Crepitus: No Crepitus: No Crepitus: No Fluctuance: No Fluctuance: No Fluctuance: No Friable: No Friable: No Friable: No Rash: No Rash: No Rash: No Scarring: No Scarring: No Scarring: No Periwound Skin Moist: Yes Moist: Yes Moist: Yes Moisture: Maceration: No Maceration: No Maceration: No Dry/Scaly: No Dry/Scaly: No Dry/Scaly: No Periwound Skin Color:  Atrophie Blanche: No Atrophie Blanche: No Atrophie Blanche: No Cyanosis: No Cyanosis: No Cyanosis: No Ecchymosis: No Ecchymosis: No Ecchymosis: No Erythema: No Erythema: No Erythema: No Hemosiderin Staining: No Hemosiderin Staining: No Hemosiderin Staining: No Mottled: No Mottled: No Mottled: No Pallor: No Pallor: No Pallor: No Rubor: No Rubor: No Rubor: No Tenderness on Yes Yes Yes Palpation: Wound Preparation: ARUL, FARABEE ([id number]) Ulcer Cleansing: Ulcer Cleansing: Ulcer Cleansing: Rinsed/Irrigated with Rinsed/Irrigated with Rinsed/Irrigated with Saline Saline Saline Topical Anesthetic Topical Anesthetic Topical Anesthetic Applied: None Applied: None Applied: None Wound Number: 7 N/A N/A Photos: No Photos N/A N/A Wound Location: Left Forearm N/A N/A Wounding Event: Gradually Appeared N/A N/A Primary Etiology: Auto-immune N/A N/A Comorbid History: Middle ear problems, N/A N/A Chronic Obstructive Pulmonary Disease (COPD), Hypertension, Peripheral Venous Disease, Type II Diabetes Date Acquired: 03/12/2015 N/A N/A Weeks of Treatment: 0 N/A N/A Wound Status: Open N/A N/A Clustered Wound: No N/A N/A Measurements L x W x D N/A N/A N/A (cm) Area (cm) : N/A N/A N/A Volume (cm) : N/A N/A N/A % Reduction in Area: N/A N/A N/A % Reduction in Volume: N/A N/A N/A Classification: Partial Thickness N/A N/A Exudate Amount: Large N/A N/A Exudate Type: Purulent N/A N/A Exudate Color: yellow, brown, green N/A N/A Wound Margin: Flat and Intact N/A N/A Granulation Amount: Medium (34-66%) N/A N/A Granulation Quality: Pink N/A N/A Necrotic Amount: Medium (34-66%) N/A N/A Necrotic Tissue: Eschar, Adherent Slough N/A N/A Exposed Structures: Fascia: No N/A N/A Fat: No Tendon: No Muscle: No Joint: No Bone: No Limited to Skin Breakdown Epithelialization: None N/A N/A Periwound Skin Texture: Edema: No N/A N/A Excoriation: No Induration: No [place], Channing  ([id number]) Callus: No Crepitus: No Fluctuance: No Friable: No Rash: No Scarring: No Periwound Skin Moist: Yes N/A N/A Moisture: Maceration: No Dry/Scaly: No Periwound Skin Color: Atrophie Blanche: No N/A N/A Cyanosis: No Ecchymosis: No Erythema: No Hemosiderin Staining: No Mottled: No Pallor: No Rubor: No Tenderness on Yes N/A N/A Palpation: Wound Preparation: Ulcer Cleansing: N/A N/A Rinsed/Irrigated with Saline Topical Anesthetic Applied: None Treatment Notes Electronic Signature(s) Signed: 08/31/2015 4:55:10 PM By: Elpidio Eric BSN, RN Entered By: Elpidio Eric on 08/31/2015 09:44:42 Alejandro Jones ([id number]) -------------------------------------------------------------------------------- Multi-Disciplinary Care Plan Details Patient Name: Alejandro Jones Date of Service: 08/31/2015 8:45 AM Medical Record Number: [id number] Patient Account Number: [id number] Date of Birth/Sex: [birth year] (60 y.o. Male) Treating RN: Clover Mealy, RN, BSN, [place] Sink Primary Care Physician: Dennis Bast Other Clinician: Referring Physician: Treating Physician/Extender: Rudene Re in Treatment: 0 Active Inactive Electronic Signature(s) Signed: 11/07/2015 9:15:57 AM By: Elpidio Eric BSN, RN Previous Signature: 08/31/2015 4:55:10 PM Version By: Elpidio Eric BSN, RN Entered By: Elpidio Eric on 11/07/2015 09:15:57 Alejandro Jones ([id number]) -------------------------------------------------------------------------------- Pain Assessment Details Patient Name: Alejandro Jones Date of Service: 08/31/2015 8:45 AM Medical Record Number: [id number] Patient Account Number: [id number] Date of Birth/Sex: July 01, 1956 (60 y.o. Male) Treating RN: Clover Mealy, RN, BSN, [place] Sink Primary Care Physician: Dennis Bast Other Clinician: Referring Physician: Treating Physician/Extender: Rudene Re in Treatment: 0 Active Problems Location of Pain Severity and Description of Pain Patient Has Paino  Yes Site Locations Rate the pain. Current Pain Level: 4 Character of Pain Describe  the Pain: Aching, Tender Pain Management and Medication Current Pain Management: How does your pain impact your activities of daily livingo Sleep: Yes Bathing: Yes Appetite: Yes Relationship With Others: Yes Bladder Continence: Yes Emotions: Yes Bowel Continence: Yes Work: Yes Toileting: Yes Drive: Yes Dressing: Yes Hobbies: Manufacturing systems engineer) Signed: 08/31/2015 4:55:10 PM By: Elpidio Eric BSN, RN Entered By: Elpidio Eric on 08/31/2015 08:53:56 Alejandro Jones ([id number]) -------------------------------------------------------------------------------- Patient/Caregiver Education Details Patient Name: Alejandro Jones Date of Service: 08/31/2015 8:45 AM Medical Record Number: [id number] Patient Account Number: [id number] Date of Birth/Gender: July 14, 1956 (60 y.o. Male) Treating RN: Clover Mealy, RN, BSN, [place] Primary Care Physician: Dennis Bast Other Clinician: Referring Physician: Treating Physician/Extender: Rudene Re in Treatment: 0 Education Assessment Education Provided To: Patient Education Topics Provided Basic Hygiene: Methods: Explain/Verbal Responses: State content correctly Safety: Methods: Explain/Verbal Responses: State content correctly [facility]: Methods: Explain/Verbal Responses: State content correctly Wound/Skin Impairment: Methods: Explain/Verbal Responses: State content correctly Electronic Signature(s) Signed: 08/31/2015 4:55:10 PM By: Elpidio Eric BSN, RN Entered By: Elpidio Eric on 08/31/2015 09:48:17 Alejandro Jones ([id number]) -------------------------------------------------------------------------------- Wound Assessment Details Patient Name: Alejandro Jones Date of Service: 08/31/2015 8:45 AM Medical Record Number: [id number] Patient Account Number: [id number] Date of Birth/Sex: 01-11-1956 (60 y.o. Male) Treating  RN: Clover Mealy, RN, BSN, [place] Primary Care Physician: Dennis Bast Other Clinician: Referring Physician: Treating Physician/Extender: Rudene Re in Treatment: 0 Wound Status Wound Number: 1 Primary Auto-immune Etiology: Wound Location: Right Neck Wound Open Wounding Event: Gradually Appeared Status: Date Acquired: 03/12/2015 Comorbid Middle ear problems, Chronic Weeks Of Treatment: 0 History: Obstructive Pulmonary Disease Clustered Wound: No (COPD), Hypertension, Peripheral Venous Disease, Type II Diabetes Photos Photo Uploaded By: Elpidio Eric on 08/31/2015 16:46:37 Wound Measurements Length: (cm) 7.6 Width: (cm) 8.4 Depth: (cm) 0.1 Area: (cm) 50.14 Volume: (cm) 5.014 % Reduction in Area: 0% % Reduction in Volume: 0% Epithelialization: None Tunneling: No Undermining: No Wound Description Classification: Partial Thickness Wound Margin: Flat and Intact Exudate Amount: Large Exudate Type: Purulent Exudate Color: yellow, brown, green Foul Odor After Cleansing: No Wound Bed Granulation Amount: Small (1-33%) Exposed Structure Granulation Quality: Pink Fascia Exposed: No Necrotic Amount: Large (67-100%) Fat Layer Exposed: No UZAIR, GODLEY ([id number]) Necrotic Quality: Eschar, Adherent Slough Tendon Exposed: No Muscle Exposed: No Joint Exposed: No Bone Exposed: No Limited to Skin Breakdown Periwound Skin Texture Texture Color No Abnormalities Noted: No No Abnormalities Noted: No Callus: No Atrophie Blanche: No Crepitus: No Cyanosis: No Excoriation: No Ecchymosis: No Fluctuance: No Erythema: No Friable: No Hemosiderin Staining: No Induration: No Mottled: No Localized Edema: No Pallor: No Rash: No Rubor: No Scarring: No Temperature / Pain Moisture Tenderness on Palpation: Yes No Abnormalities Noted: No Dry / Scaly: No Maceration: No Moist: Yes Wound Preparation Ulcer Cleansing: Rinsed/Irrigated with Saline Topical Anesthetic Applied:  None Treatment Notes Wound #1 (Right Neck) 1. Cleansed with: Clean wound with Normal Saline 5. Secondary Dressing Applied Non-Adherent pad Electronic Signature(s) Signed: 08/31/2015 9:16:51 AM By: Curtis Sites Signed: 08/31/2015 4:55:10 PM By: Elpidio Eric BSN, RN Entered By: Curtis Sites on 08/31/2015 09:16:51 Alejandro Jones ([id number]) -------------------------------------------------------------------------------- Wound Assessment Details Patient Name: Alejandro Jones Date of Service: 08/31/2015 8:45 AM Medical Record Number: [id number] Patient Account Number: [id number] Date of Birth/Sex: 07/04/1956 (59 y.o. Male) Treating RN: Clover Mealy, RN, BSN, [place] Sink Primary Care Physician: Dennis Bast Other Clinician: Referring Physician: Treating Physician/Extender: Rudene Re in Treatment: 0 Wound Status Wound Number: 2 Primary Auto-immune Etiology: Wound Location: Right Back - Superior Wound Open Wounding Event:  Gradually Appeared Status: Date Acquired: 03/12/2015 Comorbid Middle ear problems, Chronic Weeks Of Treatment: 0 History: Obstructive Pulmonary Disease Clustered Wound: No (COPD), Hypertension, Peripheral Venous Disease, Type II Diabetes Photos Photo Uploaded By: Elpidio Eric on 08/31/2015 16:50:09 Wound Measurements Length: (cm) 5.8 Width: (cm) 18.2 Depth: (cm) 0.1 Area: (cm) 82.907 Volume: (cm) 8.291 % Reduction in Area: 0% % Reduction in Volume: 0% Epithelialization: None Tunneling: No Undermining: No Wound Description Classification: Partial Thickness Wound Margin: Flat and Intact Exudate Amount: Large FABIAN, WALDER ([id number]) Foul Odor After Cleansing: No Exudate Type: Purulent Exudate Color: yellow, brown, green Wound Bed Granulation Amount: Medium (34-66%) Exposed Structure Granulation Quality: Pink Fascia Exposed: No Necrotic Amount: Medium (34-66%) Fat Layer Exposed: No Necrotic Quality: Eschar, Adherent Slough Tendon  Exposed: No Muscle Exposed: No Joint Exposed: No Bone Exposed: No Limited to Skin Breakdown Periwound Skin Texture Texture Color No Abnormalities Noted: No No Abnormalities Noted: No Callus: No Atrophie Blanche: No Crepitus: No Cyanosis: No Excoriation: No Ecchymosis: No Fluctuance: No Erythema: No Friable: No Hemosiderin Staining: No Induration: No Mottled: No Localized Edema: No Pallor: No Rash: No Rubor: No Scarring: No Temperature / Pain Moisture Tenderness on Palpation: Yes No Abnormalities Noted: No Dry / Scaly: No Maceration: No Moist: Yes Wound Preparation Ulcer Cleansing: Rinsed/Irrigated with Saline Topical Anesthetic Applied: None Electronic Signature(s) Signed: 08/31/2015 9:17:24 AM By: Curtis Sites Signed: 08/31/2015 4:55:10 PM By: Elpidio Eric BSN, RN Entered By: Curtis Sites on 08/31/2015 09:17:24 Alejandro Jones ([id number]) -------------------------------------------------------------------------------- Wound Assessment Details Patient Name: Alejandro Jones Date of Service: 08/31/2015 8:45 AM Medical Record Number: [id number] Patient Account Number: [id number] Date of Birth/Sex: 1956-07-03 (60 y.o. Male) Treating RN: Clover Mealy, RN, BSN, [place] Primary Care Physician: Dennis Bast Other Clinician: Referring Physician: Treating Physician/Extender: Rudene Re in Treatment: 0 Wound Status Wound Number: 3 Primary Auto-immune Etiology: Wound Location: Back - Midline Wound Open Wounding Event: Gradually Appeared Status: Date Acquired: 03/12/2015 Comorbid Middle ear problems, Chronic Weeks Of Treatment: 0 History: Obstructive Pulmonary Disease Clustered Wound: No (COPD), Hypertension, Peripheral Venous Disease, Type II Diabetes Photos Photo Uploaded By: Elpidio Eric on 08/31/2015 16:47:46 Wound Measurements Length: (cm) 2.7 Width: (cm) 4.9 Depth: (cm) 0.1 Area: (cm) 10.391 Volume: (cm) 1.039 % Reduction in Area: 0% % Reduction  in Volume: 0% Epithelialization: None Tunneling: No Undermining: No Wound Description Classification: Partial Thickness Wound Margin: Flat and Intact Exudate Amount: Large FAIZON, CAPOZZI ([id number]) Foul Odor After Cleansing: No Exudate Type: Purulent Exudate Color: yellow, brown, green Wound Bed Granulation Amount: Large (67-100%) Exposed Structure Granulation Quality: Pink Fascia Exposed: No Necrotic Amount: Small (1-33%) Fat Layer Exposed: No Necrotic Quality: Adherent Slough Tendon Exposed: No Muscle Exposed: No Joint Exposed: No Bone Exposed: No Limited to Skin Breakdown Periwound Skin Texture Texture Color No Abnormalities Noted: No No Abnormalities Noted: No Callus: No Atrophie Blanche: No Crepitus: No Cyanosis: No Excoriation: No Ecchymosis: No Fluctuance: No Erythema: No Friable: No Hemosiderin Staining: No Induration: No Mottled: No Localized Edema: No Pallor: No Rash: No Rubor: No Scarring: No Temperature / Pain Moisture Tenderness on Palpation: Yes No Abnormalities Noted: No Dry / Scaly: No Maceration: No Moist: No Wound Preparation Ulcer Cleansing: Rinsed/Irrigated with Saline Topical Anesthetic Applied: None Treatment Notes Wound #3 (Midline Back) 1. Cleansed with: Clean wound with Normal Saline 5. Secondary Dressing Applied Non-Adherent pad Electronic Signature(s) Signed: 08/31/2015 9:18:08 AM By: Curtis Sites Signed: 08/31/2015 4:55:10 PM By: Elpidio Eric BSN, RN Entered By: Curtis Sites on 08/31/2015 09:18:08 Alejandro Jones ([id number]) [place] Sawyer, Kingsley Plan ([id number]) --------------------------------------------------------------------------------  Wound Assessment Details Patient Name: PHILO, KURTZ Date of Service: 08/31/2015 8:45 AM Medical Record Number: [id number] Patient Account Number: [id number] Date of Birth/Sex: 05-10-1956 (60 y.o. Male) Treating RN: Clover Mealy, RN, BSN, Rita Primary Care Physician: Dennis Bast Other Clinician: Referring Physician: Treating Physician/Extender: Rudene Re in Treatment: 0 Wound Status Wound Number: 4 Primary Auto-immune Etiology: Wound Location: Right Coccyx Wound Open Wounding Event: Gradually Appeared Status: Date Acquired: 03/12/2015 Comorbid Middle ear problems, Chronic Weeks Of Treatment: 0 History: Obstructive Pulmonary Disease Clustered Wound: No (COPD), Hypertension, Peripheral Venous Disease, Type II Diabetes Photos Photo Uploaded By: Elpidio Eric on 08/31/2015 16:47:46 Wound Measurements Length: (cm) 5.5 Width: (cm) 3.8 Depth: (cm) 0.1 Area: (cm) 16.415 Volume: (cm) 1.641 % Reduction in Area: 0% % Reduction in Volume: 0% Epithelialization: None Tunneling: No Undermining: No Wound Description Classification: Partial Thickness Wound Margin: Flat and Intact Exudate Amount: Large PATSY, VARMA ([id number]) Foul Odor After Cleansing: No Exudate Type: Purulent Exudate Color: yellow, brown, green Wound Bed Granulation Amount: Large (67-100%) Exposed Structure Granulation Quality: Pink Fascia Exposed: No Necrotic Amount: Small (1-33%) Fat Layer Exposed: No Necrotic Quality: Adherent Slough Tendon Exposed: No Muscle Exposed: No Joint Exposed: No Bone Exposed: No Limited to Skin Breakdown Periwound Skin Texture Texture Color No Abnormalities Noted: No No Abnormalities Noted: No Callus: No Atrophie Blanche: No Crepitus: No Cyanosis: No Excoriation: No Ecchymosis: No Fluctuance: No Erythema: No Friable: No Hemosiderin Staining: No Induration: No Mottled: No Localized Edema: No Pallor: No Rash: No Rubor: No Scarring: No Temperature / Pain Moisture Tenderness on Palpation: Yes No Abnormalities Noted: No Dry / Scaly: No Maceration: No Moist: Yes Wound Preparation Ulcer Cleansing: Rinsed/Irrigated with Saline Topical Anesthetic Applied: None Treatment Notes Wound #4 (Right Coccyx) 1. Cleansed  with: Clean wound with Normal Saline 5. Secondary Dressing Applied Non-Adherent pad Electronic Signature(s) Signed: 08/31/2015 9:18:40 AM By: Curtis Sites Signed: 08/31/2015 4:55:10 PM By: Elpidio Eric BSN, RN Entered By: Curtis Sites on 08/31/2015 09:18:40 Alejandro Jones ([id number]) HILDING, QUINTANAR ([id number]) -------------------------------------------------------------------------------- Wound Assessment Details Patient Name: Alejandro Jones Date of Service: 08/31/2015 8:45 AM Medical Record Number: [id number] Patient Account Number: [id number] Date of Birth/Sex: November 12, 1955 (60 y.o. Male) Treating RN: Clover Mealy, RN, BSN, Rita Primary Care Physician: Dennis Bast Other Clinician: Referring Physician: Treating Physician/Extender: Rudene Re in Treatment: 0 Wound Status Wound Number: 5 Primary Auto-immune Etiology: Wound Location: Right Gluteus Wound Open Wounding Event: Gradually Appeared Status: Date Acquired: 03/12/2015 Comorbid Middle ear problems, Chronic Weeks Of Treatment: 0 History: Obstructive Pulmonary Disease Clustered Wound: No (COPD), Hypertension, Peripheral Venous Disease, Type II Diabetes Photos Photo Uploaded By: Elpidio Eric on 08/31/2015 16:49:16 Wound Measurements Length: (cm) 0.5 Width: (cm) 0.9 Depth: (cm) 0.1 Area: (cm) 0.353 Volume: (cm) 0.035 % Reduction in Area: 0% % Reduction in Volume: 0% Epithelialization: None Tunneling: No Undermining: No Wound Description Classification: Partial Thickness Wound Margin: Flat and Intact Exudate Amount: Large JAMIEON, LANNEN ([id number]) Foul Odor After Cleansing: No Exudate Type: Purulent Exudate Color: yellow, brown, green Wound Bed Granulation Amount: Large (67-100%) Exposed Structure Granulation Quality: Pink Fascia Exposed: No Necrotic Amount: Small (1-33%) Fat Layer Exposed: No Necrotic Quality: Adherent Slough Tendon Exposed: No Muscle Exposed: No Joint Exposed:  No Bone Exposed: No Limited to Skin Breakdown Periwound Skin Texture Texture Color No Abnormalities Noted: No No Abnormalities Noted: No Callus: No Atrophie Blanche: No Crepitus: No Cyanosis: No Excoriation: No Ecchymosis: No Fluctuance: No Erythema: No Friable: No Hemosiderin Staining: No Induration: No Mottled: No Localized Edema: No Pallor: No  Rash: No Rubor: No Scarring: No Temperature / Pain Moisture Tenderness on Palpation: Yes No Abnormalities Noted: No Dry / Scaly: No Maceration: No Moist: Yes Wound Preparation Ulcer Cleansing: Rinsed/Irrigated with Saline Topical Anesthetic Applied: None Treatment Notes Wound #5 (Right Gluteus) 1. Cleansed with: Clean wound with Normal Saline 5. Secondary Dressing Applied Non-Adherent pad Electronic Signature(s) Signed: 08/31/2015 9:19:13 AM By: Curtis Sites Signed: 08/31/2015 4:55:10 PM By: Elpidio Eric BSN, RN Entered By: Curtis Sites on 08/31/2015 09:19:13 Alejandro Jones ([id number]) KYZEN, HORN ([id number]) -------------------------------------------------------------------------------- Wound Assessment Details Patient Name: Alejandro Jones Date of Service: 08/31/2015 8:45 AM Medical Record Number: [id number] Patient Account Number: [id number] Date of Birth/Sex: [birth year] (60 y.o. Male) Treating RN: Clover Mealy, RN, BSN, Rita Primary Care Physician: Dennis Bast Other Clinician: Referring Physician: Treating Physician/Extender: Rudene Re in Treatment: 0 Wound Status Wound Number: 6 Primary Auto-immune Etiology: Wound Location: Left Gluteus Wound Open Wounding Event: Gradually Appeared Status: Date Acquired: 03/12/2015 Comorbid Middle ear problems, Chronic Weeks Of Treatment: 0 History: Obstructive Pulmonary Disease Clustered Wound: No (COPD), Hypertension, Peripheral Venous Disease, Type II Diabetes Photos Photo Uploaded By: Elpidio Eric on 08/31/2015 16:49:17 Wound Measurements Length:  (cm) 0.4 Width: (cm) 2.3 Depth: (cm) 0.1 Area: (cm) 0.723 Volume: (cm) 0.072 % Reduction in Area: 0% % Reduction in Volume: 0% Epithelialization: None Tunneling: No Undermining: No Wound Description Classification: Partial Thickness Wound Margin: Flat and Intact Exudate Amount: Large CAREEM, YASUI ([id number]) Foul Odor After Cleansing: No Exudate Type: Purulent Exudate Color: yellow, brown, green Wound Bed Granulation Amount: Large (67-100%) Exposed Structure Granulation Quality: Pink Fascia Exposed: No Necrotic Amount: Small (1-33%) Fat Layer Exposed: No Necrotic Quality: Adherent Slough Tendon Exposed: No Muscle Exposed: No Joint Exposed: No Bone Exposed: No Limited to Skin Breakdown Periwound Skin Texture Texture Color No Abnormalities Noted: No No Abnormalities Noted: No Callus: No Atrophie Blanche: No Crepitus: No Cyanosis: No Excoriation: No Ecchymosis: No Fluctuance: No Erythema: No Friable: No Hemosiderin Staining: No Induration: No Mottled: No Localized Edema: No Pallor: No Rash: No Rubor: No Scarring: No Temperature / Pain Moisture Tenderness on Palpation: Yes No Abnormalities Noted: No Dry / Scaly: No Maceration: No Moist: Yes Wound Preparation Ulcer Cleansing: Rinsed/Irrigated with Saline Topical Anesthetic Applied: None Treatment Notes Wound #6 (Left Gluteus) 1. Cleansed with: Clean wound with Normal Saline 5. Secondary Dressing Applied Non-Adherent pad Electronic Signature(s) Signed: 08/31/2015 9:19:46 AM By: Curtis Sites Signed: 08/31/2015 4:55:10 PM By: Elpidio Eric BSN, RN Entered By: Curtis Sites on 08/31/2015 09:19:45 Alejandro Jones ([id number]) [place], Kingsley Plan ([id number]) -------------------------------------------------------------------------------- Wound Assessment Details Patient Name: Alejandro Jones Date of Service: 08/31/2015 8:45 AM Medical Record Number: [id number] Patient Account Number:  [id number] Date of Birth/Sex: 09-02-1955 (60 y.o. Male) Treating RN: Curtis Sites Primary Care Physician: Dennis Bast Other Clinician: Referring Physician: Treating Physician/Extender: Rudene Re in Treatment: 0 Wound Status Wound Number: 7 Primary Auto-immune Etiology: Wound Location: Left Forearm Wound Open Wounding Event: Gradually Appeared Status: Date Acquired: 03/12/2015 Comorbid Middle ear problems, Chronic Weeks Of Treatment: 0 History: Obstructive Pulmonary Disease Clustered Wound: No (COPD), Hypertension, Peripheral Venous Disease, Type II Diabetes Photos Photo Uploaded By: Elpidio Eric on 08/31/2015 16:50:27 Wound Measurements % Reduction in Area: % Reduction in Volume: Epithelialization: None Tunneling: No Undermining: No Wound Description Classification: Partial Thickness Wound Margin: Flat and Intact Exudate Amount: Large Exudate Type: Purulent Exudate Color: yellow, brown, green Foul Odor After Cleansing: No Wound Bed Granulation Amount: Medium (34-66%) Exposed Structure Granulation Quality: Pink Fascia Exposed: No Necrotic Amount: Medium (34-66%) Fat Layer  Exposed: No GRANVEL, PROUDFOOT ([id number]) Necrotic Quality: Eschar, Adherent Slough Tendon Exposed: No Muscle Exposed: No Joint Exposed: No Bone Exposed: No Limited to Skin Breakdown Periwound Skin Texture Texture Color No Abnormalities Noted: No No Abnormalities Noted: No Callus: No Atrophie Blanche: No Crepitus: No Cyanosis: No Excoriation: No Ecchymosis: No Fluctuance: No Erythema: No Friable: No Hemosiderin Staining: No Induration: No Mottled: No Localized Edema: No Pallor: No Rash: No Rubor: No Scarring: No Temperature / Pain Moisture Tenderness on Palpation: Yes No Abnormalities Noted: No Dry / Scaly: No Maceration: No Moist: Yes Wound Preparation Ulcer Cleansing: Rinsed/Irrigated with Saline Topical Anesthetic Applied: None Treatment Notes Wound #7 (Left  Forearm) 1. Cleansed with: Clean wound with Normal Saline 5. Secondary Dressing Applied Non-Adherent pad Electronic Signature(s) Signed: 08/31/2015 5:48:41 PM By: Curtis Sites Entered By: Curtis Sites on 08/31/2015 09:20:17 Alejandro Jones ([id number]) -------------------------------------------------------------------------------- Vitals Details Patient Name: Alejandro Jones Date of Service: 08/31/2015 8:45 AM Medical Record Number: [id number] Patient Account Number: [id number] Date of Birth/Sex: 02-07-1956 (60 y.o. Male) Treating RN: Afful, RN, BSN, [place] Primary Care Physician: Dennis Bast Other Clinician: Referring Physician: Treating Physician/Extender: Rudene Re in Treatment: 0 Vital Signs Time Taken: 08:54 Temperature (F): 97.7 Height (in): 72 Pulse (bpm): 98 Source: Stated Respiratory Rate (breaths/min): 18 Weight (lbs): 223 Blood Pressure (mmHg): 128/88 Body Mass Index (BMI): 30.2 Reference Range: 80 - 120 mg / dl Electronic Signature(s) Signed: 08/31/2015 4:55:10 PM By: Elpidio Eric BSN, RN Entered By: Elpidio Eric on 08/31/2015 08:57:30

## 2015-09-04 DIAGNOSIS — L298 Other pruritus: Secondary | ICD-10-CM | POA: Insufficient documentation

## 2015-09-15 ENCOUNTER — Encounter (HOSPITAL_BASED_OUTPATIENT_CLINIC_OR_DEPARTMENT_OTHER): Payer: Medicare Other

## 2015-09-20 DIAGNOSIS — F411 Generalized anxiety disorder: Secondary | ICD-10-CM | POA: Insufficient documentation

## 2015-09-26 ENCOUNTER — Emergency Department: Admit: 2015-09-27 | Payer: MEDICARE | Primary: Specialist

## 2015-09-26 DIAGNOSIS — J441 Chronic obstructive pulmonary disease with (acute) exacerbation: Secondary | ICD-10-CM

## 2015-09-26 NOTE — ED Provider Notes (Addendum)
HPI Comments: 60 y.o. male with past medical history significant for COPD, peripheral vascular disease, and DM who presents from home with chief complaint of fever.  Patient arrives complaining of fever of 101.4 prior to arrival. Per wife, patient has had a "low grade" fever all day and fever increased a couple hours ago. Wife states patient's temperature was 101.4 after taking Tylenol. Patient also complains of productive cough. Wife states patient has been lethargic and experienced nausea and dry heaving since last night. Patient also has not eaten since last night. Wife states patient has been "very SOB", but patient denies. Patient has multiple sores on body which he states is due to allergies and scratching. Patient denies being on home oxygen at baseline, rhinorrhea, sore throat, chest pain, urinary symptoms, diarrhea, constipation, and sick contact. There are no other acute medical concerns at this time.    PCP: Conception Oms, MD    Note written by Jonnie Finner Pizarro, Neurosurgeon, as dictated by Verl Bangs, MD 9:12 PM        The history is provided by the patient.        Past Medical History:   Diagnosis Date   ??? Chronic obstructive pulmonary disease (HCC)    ??? Diabetes (HCC)    ??? Peripheral vascular disease (HCC)        History reviewed. No pertinent past surgical history.      History reviewed. No pertinent family history.    Social History     Social History   ??? Marital status: MARRIED     Spouse name: N/A   ??? Number of children: N/A   ??? Years of education: N/A     Occupational History   ??? Not on file.     Social History Main Topics   ??? Smoking status: Current Every Day Smoker     Packs/day: 2.00   ??? Smokeless tobacco: Not on file   ??? Alcohol use No   ??? Drug use: Not on file   ??? Sexual activity: Not on file     Other Topics Concern   ??? Not on file     Social History Narrative   ??? No narrative on file         ALLERGIES: Latex and Adhesive    Review of Systems    Constitutional: Positive for appetite change, fatigue and fever. Negative for chills.   HENT: Negative for rhinorrhea and sore throat.    Respiratory: Positive for cough. Negative for shortness of breath.    Cardiovascular: Negative for chest pain.   Gastrointestinal: Positive for nausea. Negative for abdominal pain, constipation, diarrhea and vomiting.   Genitourinary: Negative for dysuria and hematuria.   Musculoskeletal: Negative for arthralgias and myalgias.   Skin: Negative for pallor and rash.   Neurological: Positive for headaches. Negative for dizziness, weakness and light-headedness.       Vitals:    09/26/15 1924   BP: 118/68   Pulse: (!) 120   Resp: 28   Temp: (!) 100.7 ??F (38.2 ??C)   SpO2: 90%            Physical Exam   Const:  No acute distress, well developed, well nourished  Head:  Atraumatic, normocephalic  Eyes:  PERRL, conjunctiva normal, no scleral icterus  Neck:  Supple, trachea midline  Cardiovascular:  RRR, no murmurs, no gallops, no rubs  Resp:  Moderate resp distress, increased work of breathing, diffused wheezes, decreased air movement in all fields,  no rhonchi, no rales,  Abd:  Soft, non-tender, non-distended, no rebound, no guarding, no CVA tenderness  GU:  Deferred  MSK:  No pedal edema, normal ROM  Neuro:  Alert and oriented x3, no cranial nerve defect  Skin:  Warm, dry, intact  Psych: normal mood and affect, behavior is normal, judgement and thought content is normal        MDM  Number of Diagnoses or Management Options  Acute exacerbation of chronic obstructive pulmonary disease (COPD) (HCC):   Acute respiratory failure with hypoxia [facility]):      Amount and/or Complexity of Data Reviewed  Clinical lab tests: ordered and reviewed  Tests in the radiology section of CPT??: ordered and reviewed  Review and summarize past medical records: yes    Critical Care  Total time providing critical care: 30-74 minutes (45 min)    Patient Progress  Patient progress: stable    ED Course      Pt. Presents to the ER with SOB. Pt. Is in respiratory distress and was hypoxic to the 70s PTA.  No pneumonia on xray.  Negative flu.  Pt. Refused admission and signed out AMA.  I had multiple conversations trying to convince him to stay, but he still refused admission.  He was aware that he could go home and die or become permanently disabled.  Pt. Still insists on being discharged.      Procedures

## 2015-09-26 NOTE — ED Notes (Signed)
Pt left AMA as he does not want to be admitted. Dr. Dedra Skeens discussed AMA form with pt and risk of leaving in current state. Pt ambulatory to exit with wife.

## 2015-09-26 NOTE — ED Triage Notes (Addendum)
TRIAGE NOTE:  Patient arrives with c/o shortness of breath.  Patient oxygen saturations 74-80% on RA.  Patient placed on 3L via [place] oxygen saturations up to 90%.

## 2015-09-27 ENCOUNTER — Inpatient Hospital Stay
Admit: 2015-09-27 | Discharge: 2015-09-27 | Discharge status: Against Medical Advice | Payer: MEDICARE | Attending: Emergency Medicine

## 2015-09-27 LAB — CBC WITH AUTOMATED DIFF
ABS. BASOPHILS: 0 10*3/uL (ref 0.0–0.1)
ABS. EOSINOPHILS: 0 10*3/uL (ref 0.0–0.4)
ABS. LYMPHOCYTES: 0.4 10*3/uL — ABNORMAL LOW (ref 0.8–3.5)
ABS. MONOCYTES: 0.6 10*3/uL (ref 0.0–1.0)
ABS. NEUTROPHILS: 9.1 10*3/uL — ABNORMAL HIGH (ref 1.8–8.0)
BAND NEUTROPHILS: 1 % (ref 0–6)
BASOPHILS: 0 % (ref 0–1)
EOSINOPHILS: 0 % (ref 0–7)
HCT: 33.3 % — ABNORMAL LOW (ref 36.6–50.3)
HGB: 10.1 g/dL — ABNORMAL LOW (ref 12.1–17.0)
LYMPHOCYTES: 4 % — ABNORMAL LOW (ref 12–49)
MCH: 25.4 PG — ABNORMAL LOW (ref 26.0–34.0)
MCHC: 30.3 g/dL (ref 30.0–36.5)
MCV: 83.9 FL (ref 80.0–99.0)
METAMYELOCYTES: 1 % — ABNORMAL HIGH
MONOCYTES: 6 % (ref 5–13)
MYELOCYTES: 1 % — ABNORMAL HIGH
NEUTROPHILS: 87 % — ABNORMAL HIGH (ref 32–75)
PLATELET: 462 10*3/uL — ABNORMAL HIGH (ref 150–400)
RBC: 3.97 M/uL — ABNORMAL LOW (ref 4.10–5.70)
RDW: 16.4 % — ABNORMAL HIGH (ref 11.5–14.5)
WBC: 10.3 10*3/uL (ref 4.1–11.1)

## 2015-09-27 LAB — EKG, 12 LEAD, INITIAL
Atrial Rate: 122 {beats}/min
Calculated P Axis: 46 degrees
Calculated R Axis: 65 degrees
Calculated T Axis: 70 degrees
P-R Interval: 134 ms
Q-T Interval: 330 ms
QRS Duration: 84 ms
QTC Calculation (Bezet): 470 ms
Ventricular Rate: 122 {beats}/min

## 2015-09-27 LAB — METABOLIC PANEL, COMPREHENSIVE
A-G Ratio: 0.6 — ABNORMAL LOW (ref 1.1–2.2)
ALT (SGPT): 13 U/L (ref 12–78)
AST (SGOT): 8 U/L — ABNORMAL LOW (ref 15–37)
Albumin: 2.9 g/dL — ABNORMAL LOW (ref 3.5–5.0)
Alk. phosphatase: 62 U/L (ref 45–117)
Anion gap: 7 mmol/L (ref 5–15)
BUN/Creatinine ratio: 14 (ref 12–20)
BUN: 11 MG/DL (ref 6–20)
Bilirubin, total: 0.3 MG/DL (ref 0.2–1.0)
CO2: 33 mmol/L — ABNORMAL HIGH (ref 21–32)
Calcium: 9 MG/DL (ref 8.5–10.1)
Chloride: 93 mmol/L — ABNORMAL LOW (ref 97–108)
Creatinine: 0.81 MG/DL (ref 0.70–1.30)
GFR est AA: >60 mL/min/{1.73_m2} (ref >60)
GFR est non-AA: >60 mL/min/{1.73_m2} (ref >60)
Globulin: 5.1 g/dL — ABNORMAL HIGH (ref 2.0–4.0)
Glucose: 136 mg/dL — ABNORMAL HIGH (ref 65–100)
Potassium: 3.6 mmol/L (ref 3.5–5.1)
Protein, total: 8 g/dL (ref 6.4–8.2)
Sodium: 133 mmol/L — ABNORMAL LOW (ref 136–145)

## 2015-09-27 LAB — INFLUENZA A & B AG (RAPID TEST)
Influenza A Antigen: NEGATIVE
Influenza B Antigen: NEGATIVE

## 2015-09-27 LAB — LACTIC ACID: Lactic acid: 0.8 MMOL/L (ref 0.4–2.0)

## 2015-09-27 MED ORDER — METHYLPREDNISOLONE 4 MG TABS IN A DOSE PACK
4 mg | ORAL | 0 refills | Status: AC
Start: 2015-09-27 — End: ?

## 2015-09-27 MED ORDER — LEVOFLOXACIN 250 MG TAB
250 mg | ORAL | Status: AC
Start: 2015-09-27 — End: 2015-09-26
  Administered 2015-09-27: 04:00:00 via ORAL

## 2015-09-27 MED ORDER — IBUPROFEN 600 MG TAB
600 mg | ORAL | Status: AC
Start: 2015-09-27 — End: 2015-09-26
  Administered 2015-09-27: 03:00:00 via ORAL

## 2015-09-27 MED ORDER — LEVOFLOXACIN 750 MG TAB
750 mg | ORAL_TABLET | Freq: Every day | ORAL | 0 refills | Status: AC
Start: 2015-09-27 — End: 2015-10-03

## 2015-09-27 MED ORDER — PREDNISONE 20 MG TAB
20 mg | ORAL | Status: AC
Start: 2015-09-27 — End: 2015-09-26
  Administered 2015-09-27: 04:00:00 via ORAL

## 2015-09-27 MED FILL — LEVOFLOXACIN 250 MG TAB: 250 mg | ORAL | Qty: 1

## 2015-09-27 MED FILL — IBUPROFEN 600 MG TAB: 600 mg | ORAL | Qty: 1

## 2015-09-27 MED FILL — PREDNISONE 20 MG TAB: 20 mg | ORAL | Qty: 3

## 2015-09-29 ENCOUNTER — Ambulatory Visit: Payer: Self-pay | Admitting: Surgery

## 2015-10-01 LAB — CULTURE, BLOOD, PAIRED: Culture result:: NO GROWTH

## 2015-10-03 DIAGNOSIS — W19XXXA Unspecified fall, initial encounter: Secondary | ICD-10-CM | POA: Insufficient documentation

## 2015-10-03 DIAGNOSIS — D519 Vitamin B12 deficiency anemia, unspecified: Secondary | ICD-10-CM | POA: Insufficient documentation

## 2015-10-06 ENCOUNTER — Encounter (HOSPITAL_BASED_OUTPATIENT_CLINIC_OR_DEPARTMENT_OTHER): Payer: Self-pay | Admitting: Emergency Medicine

## 2015-10-06 ENCOUNTER — Emergency Department (HOSPITAL_BASED_OUTPATIENT_CLINIC_OR_DEPARTMENT_OTHER): Payer: Medicare HMO

## 2015-10-06 ENCOUNTER — Emergency Department (HOSPITAL_BASED_OUTPATIENT_CLINIC_OR_DEPARTMENT_OTHER)
Admission: EM | Admit: 2015-10-06 | Discharge: 2015-10-06 | Disposition: A | Discharge status: Home / Self Care | Payer: Medicare HMO | Attending: Emergency Medicine | Admitting: Emergency Medicine

## 2015-10-06 DIAGNOSIS — R42 Dizziness and giddiness: Secondary | ICD-10-CM | POA: Diagnosis present

## 2015-10-06 DIAGNOSIS — R5383 Other fatigue: Secondary | ICD-10-CM | POA: Insufficient documentation

## 2015-10-06 DIAGNOSIS — K219 Gastro-esophageal reflux disease without esophagitis: Secondary | ICD-10-CM | POA: Diagnosis not present

## 2015-10-06 DIAGNOSIS — R000 Tachycardia, unspecified: Secondary | ICD-10-CM | POA: Diagnosis not present

## 2015-10-06 DIAGNOSIS — Z7951 Long term (current) use of inhaled steroids: Secondary | ICD-10-CM | POA: Insufficient documentation

## 2015-10-06 DIAGNOSIS — H538 Other visual disturbances: Secondary | ICD-10-CM | POA: Insufficient documentation

## 2015-10-06 DIAGNOSIS — Z862 Personal history of diseases of the blood and blood-forming organs and certain disorders involving the immune mechanism: Secondary | ICD-10-CM | POA: Diagnosis not present

## 2015-10-06 DIAGNOSIS — Z7984 Long term (current) use of oral hypoglycemic drugs: Secondary | ICD-10-CM | POA: Diagnosis not present

## 2015-10-06 DIAGNOSIS — Z7982 Long term (current) use of aspirin: Secondary | ICD-10-CM | POA: Diagnosis not present

## 2015-10-06 DIAGNOSIS — J441 Chronic obstructive pulmonary disease with (acute) exacerbation: Secondary | ICD-10-CM | POA: Insufficient documentation

## 2015-10-06 DIAGNOSIS — I10 Essential (primary) hypertension: Secondary | ICD-10-CM | POA: Insufficient documentation

## 2015-10-06 DIAGNOSIS — F17210 Nicotine dependence, cigarettes, uncomplicated: Secondary | ICD-10-CM | POA: Diagnosis not present

## 2015-10-06 DIAGNOSIS — Z79899 Other long term (current) drug therapy: Secondary | ICD-10-CM | POA: Diagnosis not present

## 2015-10-06 DIAGNOSIS — E119 Type 2 diabetes mellitus without complications: Secondary | ICD-10-CM | POA: Diagnosis not present

## 2015-10-06 DIAGNOSIS — Z91040 Latex allergy status: Secondary | ICD-10-CM | POA: Insufficient documentation

## 2015-10-06 LAB — CBC WITH DIFFERENTIAL/PLATELET
BASOS ABS: 0 10*3/uL (ref 0.0–0.1)
Basophils Relative: 0 %
EOS ABS: 0.1 10*3/uL (ref 0.0–0.7)
EOS PCT: 1 %
HCT: 35.4 % — ABNORMAL LOW (ref 39.0–52.0)
HEMOGLOBIN: 11.5 g/dL — AB (ref 13.0–17.0)
Lymphocytes Relative: 15 %
Lymphs Abs: 1.9 10*3/uL (ref 0.7–4.0)
MCH: 26 pg (ref 26.0–34.0)
MCHC: 32.5 g/dL (ref 30.0–36.0)
MCV: 79.9 fL (ref 78.0–100.0)
Monocytes Absolute: 1.1 10*3/uL — ABNORMAL HIGH (ref 0.1–1.0)
Monocytes Relative: 9 %
NEUTROS PCT: 75 %
Neutro Abs: 9.3 10*3/uL — ABNORMAL HIGH (ref 1.7–7.7)
PLATELETS: 550 10*3/uL — AB (ref 150–400)
RBC: 4.43 MIL/uL (ref 4.22–5.81)
RDW: 16.6 % — ABNORMAL HIGH (ref 11.5–15.5)
WBC: 12.4 10*3/uL — AB (ref 4.0–10.5)

## 2015-10-06 LAB — COMPREHENSIVE METABOLIC PANEL
ALBUMIN: 3.5 g/dL (ref 3.5–5.0)
ALT: 12 U/L — AB (ref 17–63)
AST: 18 U/L (ref 15–41)
Alkaline Phosphatase: 50 U/L (ref 38–126)
Anion gap: 10 (ref 5–15)
BUN: 17 mg/dL (ref 6–20)
CHLORIDE: 93 mmol/L — AB (ref 101–111)
CO2: 27 mmol/L (ref 22–32)
CREATININE: 1.15 mg/dL (ref 0.61–1.24)
Calcium: 8.9 mg/dL (ref 8.9–10.3)
GFR calc Af Amer: >60 mL/min (ref >60)
GFR calc non Af Amer: >60 mL/min (ref >60)
GLUCOSE: 110 mg/dL — AB (ref 65–99)
Potassium: 3.8 mmol/L (ref 3.5–5.1)
SODIUM: 130 mmol/L — AB (ref 135–145)
Total Bilirubin: 0.4 mg/dL (ref 0.3–1.2)
Total Protein: 7.3 g/dL (ref 6.5–8.1)

## 2015-10-06 LAB — URINALYSIS, ROUTINE W REFLEX MICROSCOPIC
BILIRUBIN URINE: NEGATIVE
GLUCOSE, UA: NEGATIVE mg/dL
HGB URINE DIPSTICK: NEGATIVE
Ketones, ur: NEGATIVE mg/dL
Leukocytes, UA: NEGATIVE
NITRITE: NEGATIVE
PH: 5.5 (ref 5.0–8.0)
Protein, ur: NEGATIVE mg/dL
SPECIFIC GRAVITY, URINE: 1.018 (ref 1.005–1.030)

## 2015-10-06 MED ORDER — SODIUM CHLORIDE 0.9 % IV BOLUS (SEPSIS)
500.0000 mL | Freq: Once | INTRAVENOUS | Status: AC
  Administered 2015-10-06: 500 mL via INTRAVENOUS

## 2015-10-06 NOTE — ED Provider Notes (Addendum)
CSN: [id number]     Arrival date & time 10/06/15  1847 History  By signing my name below, I, Phillis Haggis, attest that this documentation has been prepared under the direction and in the presence of Alvira Monday, MD. Electronically Signed: Phillis Haggis, ED Scribe. 10/06/2015. 8:33 PM.   Chief Complaint  Patient presents with  . Hypotension  . Dizziness   Patient is a 60 y.o. male presenting with dizziness. The history is provided by the patient. No language interpreter was used.  Dizziness Quality:  Lightheadedness Severity:  Moderate Onset quality:  Sudden Timing:  Intermittent Progression:  Unchanged Chronicity:  New Ineffective treatments:  None tried Associated symptoms: vision changes   Associated symptoms: no blood in stool, no chest pain, no diarrhea, no headaches, no nausea, no shortness of breath and no vomiting   HPI Comments: Lafe Clerk is a 60 y.o. Male brought in by wife with a hx of DM, HTN, anemia, PVD, and COPD who presents to the Emergency Department complaining of low blood pressure and dizziness onset earlier this morning. Pt was told by his home health nurse and PCP this morning to come in for possible fluids after they discovered he had a low BP, lowest being 88/64. Pt took Losartan before his BP went down, but has not had a recent change in dosage. She reports that he had dizziness, lightheadedness, fatigue, and blurred vision. Pt states that he currently does not have dizziness. Pt was in the ED last week for nausea and fever; he was prescribed an antibiotic but finished the course on Wednesday. He states that he was having diarrhea earlier in the week while taking the antibiotic, but this has since resolved. Pt fell earlier this week out of bed and had a negative CT scan. He denies appetite change, fever, chills, nausea, vomiting, diarrhea, hematochezia, melena, chest pain, SOB, dysuria, hematuria, frequency or urgency. Pt is ambulatory with a walker.     Past Medical History  Diagnosis Date  . Hypertension   . Diabetes mellitus   . Acid reflux   . Asthma   . Constipation   . Peripheral vascular disease (HCC)   . COPD (chronic obstructive pulmonary disease) [facility])    Past Surgical History  Procedure Laterality Date  . Ankle surgery    . Iliac artery stent    . Abdominal aortagram N/A 05/10/2013    Procedure: ABDOMINAL Ronny Flurry;  Surgeon: Chuck Hint, MD;  Location: [facility] C[facility] LAB;  Service: Cardiovascular;  Laterality: N/A;  . Lower extremity angiogram Bilateral 05/10/2013    Procedure: LOWER EXTREMITY ANGIOGRAM;  Surgeon: Chuck Hint, MD;  Location: [facility] CATH LAB;  Service: Cardiovascular;  Laterality: Bilateral;   Family History  Problem Relation Age of Onset  . Diabetes Mother   . Diabetes Sister   . Cancer Brother   . Diabetes Brother    Social History  Substance Use Topics  . Smoking status: Current Every Day Smoker -- 3.00 packs/day for 45 years    Types: Cigarettes  . Smokeless tobacco: Never Used     Comment: pt states that he is now taking chantix and he is slowly cutting back  . Alcohol Use: No    Review of Systems  Constitutional: Positive for fatigue. Negative for fever and chills.  HENT: Negative for sore throat.   Eyes: Negative for visual disturbance.  Respiratory: Negative for shortness of breath.   Cardiovascular: Negative for chest pain.  Gastrointestinal: Negative for nausea, vomiting, abdominal pain, diarrhea and  blood in stool.  Genitourinary: Negative for dysuria, urgency, frequency, hematuria and difficulty urinating.  Musculoskeletal: Negative for back pain and neck stiffness.  Skin: Negative for rash.  Neurological: Positive for dizziness and light-headedness. Negative for syncope and headaches.   Allergies  Chantix; Latex; and Metformin and related  Home Medications   Prior to Admission medications   Medication Sig Start Date End Date Taking? Authorizing Provider   acetaminophen (TYLENOL) 500 MG tablet Take 1,000 mg by mouth every 6 (six) hours as needed for moderate pain.    Historical Provider, MD  albuterol (PROVENTIL) (2.5 MG/3ML) 0.083% nebulizer solution Take 2.5 mg by nebulization every 6 (six) hours as needed for wheezing or shortness of breath.    Historical Provider, MD  aspirin EC 81 MG tablet Take 81 mg by mouth daily.      Historical Provider, MD  atorvastatin (LIPITOR) 20 MG tablet Take 20 mg by mouth daily at 6 PM.     Historical Provider, MD  dextromethorphan (DELSYM) 30 MG/5ML liquid Take 60 mg by mouth as needed for cough.    Historical Provider, MD  Fluticasone Furoate-Vilanterol (BREO ELLIPTA) 100-25 MCG/INH AEPB Inhale 1 puff into the lungs daily as needed (for shortness of breath).    Historical Provider, MD  glipiZIDE (GLUCOTROL) 10 MG tablet Take 10 mg by mouth daily before breakfast.    Historical Provider, MD  hydrOXYzine (ATARAX/VISTARIL) 25 MG tablet Take 25 mg by mouth 3 (three) times daily as needed.    Historical Provider, MD  ibuprofen (ADVIL,MOTRIN) 200 MG tablet Take 600 mg by mouth every 6 (six) hours as needed. For pain.    Historical Provider, MD  lidocaine (LIDODERM) 5 % Place 2 patches onto the skin daily. Remove & Discard patch within 12 hours or as directed by MD    Historical Provider, MD  lidocaine (LMX) 4 % cream Apply 1 application topically as needed (FOR NECK/HEAD WOUND).    Historical Provider, MD  losartan-hydrochlorothiazide (HYZAAR) 100-12.5 MG per tablet Take 1 tablet by mouth daily.    Historical Provider, MD  Menthol, Topical Analgesic, 5 % PADS Apply 1 patch topically at bedtime as needed (for leg pain).     Historical Provider, MD  methocarbamol (ROBAXIN) 500 MG tablet Take 500 mg by mouth.  07/03/15   Historical Provider, MD  NITROSTAT 0.4 MG SL tablet Place 0.4 mg under the tongue every 5 (five) minutes as needed for chest pain.  05/08/15   Historical Provider, MD  omeprazole (PRILOSEC) 20 MG capsule  Take 20 mg by mouth daily.    Historical Provider, MD  OVER THE COUNTER MEDICATION Apply 1 application topically daily as needed (WOUND CARE-THERA HONEY).    Historical Provider, MD  pioglitazone (ACTOS) 30 MG tablet Take 30 mg by mouth daily.    Historical Provider, MD  Saxagliptin-Metformin (KOMBIGLYZE XR) 2.12-998 MG TB24 Take 1 tablet by mouth 2 (two) times daily.    Historical Provider, MD  traMADol (ULTRAM) 50 MG tablet Take 50 mg by mouth every 6 (six) hours as needed. For pain    Historical Provider, MD   BP 117/92 mmHg  Pulse 119  Temp(Src) 97.9 F (36.6 C) (Oral)  Resp 18  SpO2 100% Physical Exam  Constitutional: He is oriented to person, place, and time. He appears well-developed and well-nourished.  HENT:  Head: Normocephalic and atraumatic.  Eyes: EOM are normal. Pupils are equal, round, and reactive to light.  Neck: Normal range of motion. Neck supple.  Cardiovascular:  Normal rate, regular rhythm and normal heart sounds.  Exam reveals no gallop and no friction rub.   No murmur heard. Pulmonary/Chest: Effort normal. He has wheezes.  Abdominal: Soft. There is no tenderness.  Musculoskeletal: Normal range of motion.  Neurological: He is alert and oriented to person, place, and time.  Skin: Skin is warm and dry.  Diffuse well-healing wounds  Psychiatric: He has a normal mood and affect. His behavior is normal.  Nursing note and vitals reviewed.   ED Course  Procedures (including critical care time) DIAGNOSTIC STUDIES: Oxygen Saturation is 96% on RA, normal by my interpretation.    COORDINATION OF CARE: 7:21 PM-Discussed treatment plan which includes labs, x-ray and EKG with pt at bedside and pt agreed to plan.    Labs Review Labs Reviewed  CBC WITH DIFFERENTIAL/PLATELET - Abnormal; Notable for the following:    WBC 12.4 (*)    Hemoglobin 11.5 (*)    HCT 35.4 (*)    RDW 16.6 (*)    Platelets 550 (*)    Neutro Abs 9.3 (*)    Monocytes Absolute 1.1 (*)    All  other components within normal limits  COMPREHENSIVE METABOLIC PANEL - Abnormal; Notable for the following:    Sodium 130 (*)    Chloride 93 (*)    Glucose, Bld 110 (*)    ALT 12 (*)    All other components within normal limits  URINALYSIS, ROUTINE W REFLEX MICROSCOPIC (NOT AT [facility]) - Abnormal; Notable for the following:    APPearance CLOUDY (*)    All other components within normal limits  URINE CULTURE    Imaging Review Dg Chest 2 View  10/06/2015  CLINICAL DATA:  Headache.  Hypotension. EXAM: CHEST  2 VIEW COMPARISON:  08/01/2015 FINDINGS: The heart size and mediastinal contours are within normal limits. Both lungs are clear. No evidence of pleural effusion. Lower thoracic spine degenerative changes again noted. IMPRESSION: No active cardiopulmonary disease. Electronically Signed   By: Myles RosenthalJohn  Stahl M.D.   On: 10/06/2015 19:54   I have personally reviewed and evaluated these images and lab results as part of my medical decision-making.   EKG Interpretation None      MDM  Will get UA and labs Pt will receive IV fluids and EKG Chest x-ray to rule out pneumonia  Final diagnoses:  Lightheadedness   60 year old male with history of hypertension, diabetes, asthma, peripheral acid disease, COPD, chronic skin wounds presents with concern for low blood pressures measured by home health. On arrival to the emergency department, patient has normal blood pressures. He has tachycardia which he reports is chronic, and on review of past medical records, has been present on prior visits. Chest x-ray was done which showed no evidence of pneumonia. Urinalysis showed no evidence of UTI. Patient's skin lesions do not have any signs of surrounding cellulitis or fluctuance. He denies any other continuing infectious symptoms, and doubt sepsis as an etiology of his low blood pressures at home. Patient denies any chest pain or shortness of breath, and doubt ACS or PE. EKG was evaluated by me and showed sinus  tachycardia similar to prior.  Given patient's recent illness with nausea vomiting, as well as recent diarrhea associated with antibiotic use, his symptoms may be secondary to hypovolemia and dehydration. Patient was given 500 mL of normal saline. He is asymptomatic with normal blood pressures in the emergency department.  Discussed reasons to return to the ED in detail. Patient discharged in stable condition  with understanding of reasons to return.   I personally performed the services described in this documentation, which was scribed in my presence. The recorded information has been reviewed and is accurate.     Alvira Monday, MD 10/07/15 [phone]

## 2015-10-06 NOTE — Discharge Instructions (Signed)

## 2015-10-06 NOTE — ED Notes (Signed)
Dr. Schlossman in room with patient now. 

## 2015-10-06 NOTE — ED Notes (Signed)
60 yo male with c/o of dizziness and low bp. States his home health nurse and pcp told him to come for possible fluids. Pt alert and oriented at triage and denies dizziness.

## 2015-10-08 LAB — URINE CULTURE: CULTURE: NO GROWTH

## 2015-10-11 ENCOUNTER — Encounter: Payer: Self-pay | Admitting: Podiatry

## 2015-10-11 ENCOUNTER — Ambulatory Visit (INDEPENDENT_AMBULATORY_CARE_PROVIDER_SITE_OTHER): Payer: Self-pay | Admitting: Podiatry

## 2015-10-11 DIAGNOSIS — E1151 Type 2 diabetes mellitus with diabetic peripheral angiopathy without gangrene: Secondary | ICD-10-CM

## 2015-10-11 DIAGNOSIS — M79675 Pain in left toe(s): Secondary | ICD-10-CM

## 2015-10-11 DIAGNOSIS — Z86010 Personal history of colonic polyps: Secondary | ICD-10-CM | POA: Insufficient documentation

## 2015-10-11 DIAGNOSIS — M79676 Pain in unspecified toe(s): Secondary | ICD-10-CM

## 2015-10-11 DIAGNOSIS — M79674 Pain in right toe(s): Secondary | ICD-10-CM

## 2015-10-11 DIAGNOSIS — B351 Tinea unguium: Secondary | ICD-10-CM

## 2015-10-11 NOTE — Progress Notes (Signed)
Patient ID: Alejandro FuRickie Aguila, male   DOB: 04/09/1956, 60 y.o.   MRN: [id number] HPI  Complaint:  Visit Type: Patient returns to my office for continued preventative foot care services. Complaint: Patient states" my nails have grown long and thick and become painful to walk and wear shoes" Patient has been diagnosed with DM with vascular and neuropathy. Marland Kitchen. He presents for preventative foot care services. No changes to ROS  Podiatric Exam: Vascular: dorsalis pedis and posterior tibial pulses are negative. Capillary return is immediate. Temperature gradient is negative. Skin turgor WNL,   Sensorium: Absent Semmes Weinstein monofilament test.   Nail Exam: Pt has thick disfigured discolored nails with subungual debris noted bilateral entire nail hallux through fifth toenails Ulcer Exam: There is no evidence of ulcer or pre-ulcerative changes or infection. Orthopedic Exam: Muscle tone and strength are WNL. No limitations in general ROM. No crepitus or effusions noted. Foot type and digits show no abnormalities. Bony prominences are unremarkable. Skin: No Porokeratosis. No infection or ulcers.  Healing skin lesion left ankle lateral aspect.  Diagnosis:  Tinea unguium, Pain in right toe, pain in left toes  Treatment & Plan Procedures and Treatment: Consent by patient was obtained for treatment procedures. The patient understood the discussion of treatment and procedures well. All questions were answered thoroughly reviewed. Debridement of mycotic and hypertrophic toenails, 1 through 5 bilateral and clearing of subungual debris. No ulceration, no infection noted.  Return Visit-Office Procedure: Patient instructed to return to the office for a follow up visit  4months for continued evaluation and treatment.   Helane GuntherGregory Wenzel Backlund DPM

## 2015-10-18 DIAGNOSIS — G43009 Migraine without aura, not intractable, without status migrainosus: Secondary | ICD-10-CM | POA: Insufficient documentation

## 2015-10-18 DIAGNOSIS — M5124 Other intervertebral disc displacement, thoracic region: Secondary | ICD-10-CM | POA: Insufficient documentation

## 2015-10-18 DIAGNOSIS — M5412 Radiculopathy, cervical region: Secondary | ICD-10-CM | POA: Insufficient documentation

## 2015-10-18 DIAGNOSIS — G5680 Other specified mononeuropathies of unspecified upper limb: Secondary | ICD-10-CM | POA: Insufficient documentation

## 2015-10-18 DIAGNOSIS — G894 Chronic pain syndrome: Secondary | ICD-10-CM | POA: Insufficient documentation

## 2015-10-31 ENCOUNTER — Emergency Department (HOSPITAL_COMMUNITY)
Admission: EM | Admit: 2015-10-31 | Discharge: 2015-10-31 | Disposition: A | Discharge status: Home / Self Care | Payer: Medicare HMO | Attending: Emergency Medicine | Admitting: Emergency Medicine

## 2015-10-31 ENCOUNTER — Emergency Department (HOSPITAL_COMMUNITY): Payer: Medicare HMO

## 2015-10-31 ENCOUNTER — Encounter (HOSPITAL_COMMUNITY): Payer: Self-pay | Admitting: Emergency Medicine

## 2015-10-31 DIAGNOSIS — F17210 Nicotine dependence, cigarettes, uncomplicated: Secondary | ICD-10-CM | POA: Diagnosis not present

## 2015-10-31 DIAGNOSIS — Z7982 Long term (current) use of aspirin: Secondary | ICD-10-CM | POA: Diagnosis not present

## 2015-10-31 DIAGNOSIS — E119 Type 2 diabetes mellitus without complications: Secondary | ICD-10-CM | POA: Insufficient documentation

## 2015-10-31 DIAGNOSIS — Z79899 Other long term (current) drug therapy: Secondary | ICD-10-CM | POA: Insufficient documentation

## 2015-10-31 DIAGNOSIS — I10 Essential (primary) hypertension: Secondary | ICD-10-CM | POA: Diagnosis not present

## 2015-10-31 DIAGNOSIS — J449 Chronic obstructive pulmonary disease, unspecified: Secondary | ICD-10-CM | POA: Insufficient documentation

## 2015-10-31 DIAGNOSIS — R21 Rash and other nonspecific skin eruption: Secondary | ICD-10-CM | POA: Insufficient documentation

## 2015-10-31 DIAGNOSIS — Z7984 Long term (current) use of oral hypoglycemic drugs: Secondary | ICD-10-CM | POA: Insufficient documentation

## 2015-10-31 DIAGNOSIS — Z91040 Latex allergy status: Secondary | ICD-10-CM | POA: Diagnosis not present

## 2015-10-31 LAB — CBC WITH DIFFERENTIAL/PLATELET
BASOS ABS: 0.1 10*3/uL (ref 0.0–0.1)
Basophils Relative: 0 %
Eosinophils Absolute: 0.2 10*3/uL (ref 0.0–0.7)
Eosinophils Relative: 2 %
HEMATOCRIT: 34.7 % — AB (ref 39.0–52.0)
Hemoglobin: 11.2 g/dL — ABNORMAL LOW (ref 13.0–17.0)
LYMPHS ABS: 2.1 10*3/uL (ref 0.7–4.0)
LYMPHS PCT: 18 %
MCH: 26.2 pg (ref 26.0–34.0)
MCHC: 32.3 g/dL (ref 30.0–36.0)
MCV: 81.3 fL (ref 78.0–100.0)
MONO ABS: 0.9 10*3/uL (ref 0.1–1.0)
MONOS PCT: 8 %
NEUTROS ABS: 8.2 10*3/uL — AB (ref 1.7–7.7)
Neutrophils Relative %: 72 %
Platelets: 447 10*3/uL — ABNORMAL HIGH (ref 150–400)
RBC: 4.27 MIL/uL (ref 4.22–5.81)
RDW: 18 % — AB (ref 11.5–15.5)
WBC: 11.4 10*3/uL — ABNORMAL HIGH (ref 4.0–10.5)

## 2015-10-31 LAB — COMPREHENSIVE METABOLIC PANEL
ALT: 11 U/L — ABNORMAL LOW (ref 17–63)
AST: 16 U/L (ref 15–41)
Albumin: 3.2 g/dL — ABNORMAL LOW (ref 3.5–5.0)
Alkaline Phosphatase: 53 U/L (ref 38–126)
Anion gap: 9 (ref 5–15)
BILIRUBIN TOTAL: 0.3 mg/dL (ref 0.3–1.2)
BUN: 11 mg/dL (ref 6–20)
CALCIUM: 9.6 mg/dL (ref 8.9–10.3)
CO2: 29 mmol/L (ref 22–32)
CREATININE: 0.98 mg/dL (ref 0.61–1.24)
Chloride: 96 mmol/L — ABNORMAL LOW (ref 101–111)
GFR calc Af Amer: >60 mL/min (ref >60)
GLUCOSE: 112 mg/dL — AB (ref 65–99)
POTASSIUM: 3.5 mmol/L (ref 3.5–5.1)
Sodium: 134 mmol/L — ABNORMAL LOW (ref 135–145)
TOTAL PROTEIN: 7.2 g/dL (ref 6.5–8.1)

## 2015-10-31 MED ORDER — DEXAMETHASONE 4 MG PO TABS
10.0000 mg | ORAL_TABLET | Freq: Once | ORAL | Status: AC
  Administered 2015-10-31: 10 mg via ORAL
  Filled 2015-10-31: qty 3

## 2015-10-31 MED ORDER — DIPHENHYDRAMINE HCL 25 MG PO CAPS
ORAL_CAPSULE | ORAL | Status: DC
Start: 2015-10-31 — End: 2015-10-31
  Filled 2015-10-31: qty 1

## 2015-10-31 MED ORDER — DIPHENHYDRAMINE HCL 25 MG PO CAPS
25.0000 mg | ORAL_CAPSULE | Freq: Once | ORAL | Status: AC
  Administered 2015-10-31: 25 mg via ORAL

## 2015-10-31 NOTE — ED Provider Notes (Signed)
CSN: [id number]     Arrival date & time 10/31/15  1131 History   First MD Initiated Contact with Patient 10/31/15 1942     Chief Complaint  Patient presents with  . Rash  . Allergic Reaction     (Consider location/radiation/quality/duration/timing/severity/associated sxs/prior Treatment) Patient is a 60 y.o. male presenting with general illness. The history is provided by the patient and the spouse.  Illness Severity:  Severe Onset quality:  Gradual Duration:  6 months Timing:  Constant Progression:  Worsening Chronicity:  New Associated symptoms: rash   Associated symptoms: no abdominal pain, no chest pain, no congestion, no diarrhea, no fever, no headaches, no myalgias, no shortness of breath and no vomiting    60 yo M with a chief complaint of an itchy rash. This been going on for at least 6 months. Patient is seen multiple dermatologists as well as an allergist for this. There is no known etiology for the symptoms. Patient has scratched his neck to the point where he has an open wound. It is not bleeding appears to be old. He continues to scratch throughout the exam. Family feels like something is done about his itching. He tried Atarax, Benadryl, menthol creams without relief. They're also concerned that he has been mildly lightheaded upon standing. Lightheaded upon standing has resolved prior to my evaluation.  Past Medical History  Diagnosis Date  . Hypertension   . Diabetes mellitus   . Acid reflux   . Asthma   . Constipation   . Peripheral vascular disease (HCC)   . COPD (chronic obstructive pulmonary disease) [facility])    Past Surgical History  Procedure Laterality Date  . Ankle surgery    . Iliac artery stent    . Abdominal aortagram N/A 05/10/2013    Procedure: ABDOMINAL Ronny Flurry;  Surgeon: Chuck Hint, MD;  Location: [facility] CATH LAB;  Service: Cardiovascular;  Laterality: N/A;  . Lower extremity angiogram Bilateral 05/10/2013    Procedure: LOWER EXTREMITY  ANGIOGRAM;  Surgeon: Chuck Hint, MD;  Location: [facility] CATH LAB;  Service: Cardiovascular;  Laterality: Bilateral;   Family History  Problem Relation Age of Onset  . Diabetes Mother   . Diabetes Sister   . Cancer Brother   . Diabetes Brother    Social History  Substance Use Topics  . Smoking status: Current Every Day Smoker -- 3.00 packs/day for 45 years    Types: Cigarettes  . Smokeless tobacco: Never Used     Comment: pt states that he is now taking chantix and he is slowly cutting back  . Alcohol Use: No    Review of Systems  Constitutional: Negative for fever and chills.  HENT: Negative for congestion and facial swelling.   Eyes: Negative for discharge and visual disturbance.  Respiratory: Negative for shortness of breath.   Cardiovascular: Negative for chest pain and palpitations.  Gastrointestinal: Negative for vomiting, abdominal pain and diarrhea.  Musculoskeletal: Negative for myalgias and arthralgias.  Skin: Positive for rash and wound. Negative for color change.  Neurological: Negative for tremors, syncope and headaches.  Psychiatric/Behavioral: Negative for confusion and dysphoric mood.      Allergies  Chantix; Latex; and Metformin and related  Home Medications   Prior to Admission medications   Medication Sig Start Date End Date Taking? Authorizing Provider  acetaminophen (TYLENOL) 500 MG tablet Take 1,000 mg by mouth every 6 (six) hours as needed for moderate pain.   Yes Historical Provider, MD  aspirin EC 81 MG tablet Take  81 mg by mouth daily.     Yes Historical Provider, MD  atorvastatin (LIPITOR) 20 MG tablet Take 20 mg by mouth daily at 6 PM.    Yes Historical Provider, MD  docusate sodium (COLACE) 100 MG capsule Take 200 mg by mouth 2 (two) times daily.   Yes Historical Provider, MD  escitalopram (LEXAPRO) 10 MG tablet  09/21/15  Yes Historical Provider, MD  Fluticasone Furoate-Vilanterol (BREO ELLIPTA) 100-25 MCG/INH AEPB Inhale 1 puff into the  lungs daily as needed (for shortness of breath).   Yes Historical Provider, MD  glipiZIDE (GLUCOTROL) 10 MG tablet Take 10 mg by mouth daily before breakfast.   Yes Historical Provider, MD  hydrOXYzine (ATARAX/VISTARIL) 25 MG tablet Take 25 mg by mouth 3 (three) times daily as needed.   Yes Historical Provider, MD  ibuprofen (ADVIL,MOTRIN) 200 MG tablet Take 600 mg by mouth every 6 (six) hours as needed. For pain.   Yes Historical Provider, MD  lidocaine (LIDODERM) 5 % Place 2 patches onto the skin daily. Remove & Discard patch within 12 hours or as directed by MD   Yes Historical Provider, MD  losartan-hydrochlorothiazide (HYZAAR) 100-12.5 MG per tablet Take 1 tablet by mouth daily.   Yes Historical Provider, MD  Menthol, Topical Analgesic, 5 % PADS Apply 1 patch topically at bedtime as needed (for leg pain).    Yes Historical Provider, MD  NITROSTAT 0.4 MG SL tablet Place 0.4 mg under the tongue every 5 (five) minutes as needed for chest pain.  05/08/15  Yes Historical Provider, MD  omeprazole (PRILOSEC) 20 MG capsule Take 20 mg by mouth daily.   Yes Historical Provider, MD  OVER THE COUNTER MEDICATION Apply 1 application topically daily as needed (WOUND CARE-THERA HONEY).   Yes Historical Provider, MD  pioglitazone (ACTOS) 30 MG tablet Take 30 mg by mouth daily.   Yes Historical Provider, MD  Saxagliptin-Metformin (KOMBIGLYZE XR) 2.12-998 MG TB24 Take 1 tablet by mouth 2 (two) times daily.   Yes Historical Provider, MD  traMADol (ULTRAM) 50 MG tablet Take 50 mg by mouth every 6 (six) hours as needed. For pain   Yes Historical Provider, MD  albuterol (PROVENTIL) (2.5 MG/3ML) 0.083% nebulizer solution Take 2.5 mg by nebulization every 6 (six) hours as needed for wheezing or shortness of breath. Reported on 10/31/2015    Historical Provider, MD  dextromethorphan (DELSYM) 30 MG/5ML liquid Take 60 mg by mouth as needed for cough.    Historical Provider, MD  ferrous sulfate 325 (65 FE) MG EC tablet Take  325 mg by mouth. 09/26/15 10/26/15  Historical Provider, MD   BP 150/97 mmHg  Pulse 99  Temp(Src) 98.6 F (37 C)  Resp 18  SpO2 97% Physical Exam  Constitutional: He is oriented to person, place, and time. He appears well-developed and well-nourished.  HENT:  Head: Normocephalic and atraumatic.  Eyes: EOM are normal. Pupils are equal, round, and reactive to light.  Neck: Normal range of motion. Neck supple. No JVD present.    Cardiovascular: Normal rate and regular rhythm.  Exam reveals no gallop and no friction rub.   No murmur heard. Pulmonary/Chest: No respiratory distress. He has no wheezes.  Abdominal: He exhibits no distension. There is no rebound and no guarding.  Musculoskeletal: Normal range of motion.  Neurological: He is alert and oriented to person, place, and time.  Skin: No rash noted. No pallor.     Psychiatric: He has a normal mood and affect. His behavior is  normal.  Nursing note and vitals reviewed.   ED Course  Procedures (including critical care time) Labs Review Labs Reviewed  CBC WITH DIFFERENTIAL/PLATELET - Abnormal; Notable for the following:    WBC 11.4 (*)    Hemoglobin 11.2 (*)    HCT 34.7 (*)    RDW 18.0 (*)    Platelets 447 (*)    Neutro Abs 8.2 (*)    All other components within normal limits  COMPREHENSIVE METABOLIC PANEL - Abnormal; Notable for the following:    Sodium 134 (*)    Chloride 96 (*)    Glucose, Bld 112 (*)    Albumin 3.2 (*)    ALT 11 (*)    All other components within normal limits    Imaging Review Dg Elbow Complete Left  10/31/2015  CLINICAL DATA:  Pain and swelling posterior left elbow for 2 weeks, no known injury EXAM: LEFT ELBOW - COMPLETE 3+ VIEW COMPARISON:  None. FINDINGS: No fracture or dislocation. No joint effusion. Mild degenerative change in the anterior compartment the elbow. Moderate soft tissue swelling over the olecranon process suggesting bursitis. IMPRESSION: Nonspecific soft tissue swelling suggesting  bursitis. No acute osseous abnormalities. Electronically Signed   By: Esperanza Heiraymond  Rubner M.D.   On: 10/31/2015 17:23   I have personally reviewed and evaluated these images and lab results as part of my medical decision-making.   EKG Interpretation None      MDM   Final diagnoses:  Rash    60 yo M with a itchy rash. I am unsure the etiology of this rash. It does look somewhat like psoriasis. Patient is not hypertensive on the ED. His bilirubin is not elevated so this is not the etiology of itching. Has basic labs are unremarkable. Discussed with the patient that for a skin condition he likely need to see another dermatologist. He stated that his family doctor is [facility] Given contact information for the dermatology center there. Suggest that he try and call them himself. We'll try a dose of Decadron.  8:41 PM:  I have discussed the diagnosis/risks/treatment options with the patient and family and believe the pt to be eligible for discharge home to follow-up with Dermatology. We also discussed returning to the ED immediately if new or worsening sx occur. We discussed the sx which are most concerning (e.g., infection of the wounds from scratching) that necessitate immediate return. Medications administered to the patient during their visit and any new prescriptions provided to the patient are listed below.  Medications given during this visit Medications  diphenhydrAMINE (BENADRYL) capsule 25 mg (25 mg Oral Given 10/31/15 1353)  dexamethasone (DECADRON) tablet 10 mg (10 mg Oral Given 10/31/15 2032)    New Prescriptions   No medications on file    The patient appears reasonably screen and/or stabilized for discharge and I doubt any other medical condition or other [facility] requiring further screening, evaluation, or treatment in the ED at this time prior to discharge.       Melene Planan Pasqualina Colasurdo, DO 10/31/15 2041

## 2015-10-31 NOTE — Discharge Instructions (Signed)
Follow-up with a dermatologist for this issue. Do not scratch. Try and use warm compresses for itching. Allergies An allergy is when your body reacts to a substance in a way that is not normal. An allergic reaction can happen after you:  Eat something.  Breathe in something.  Touch something. WHAT KINDS OF ALLERGIES ARE THERE? You can be allergic to:  Things that are only around during certain seasons, like molds and pollens.  Foods.  Drugs.  Insects.  Animal dander. WHAT ARE SYMPTOMS OF ALLERGIES?  Puffiness (swelling). This may happen on the lips, face, tongue, mouth, or throat.  Sneezing.  Coughing.  Breathing loudly (wheezing).  Stuffy nose.  Tingling in the mouth.  A rash.  Itching.  Itchy, red, puffy areas of skin (hives).  Watery eyes.  Throwing up (vomiting).  Watery poop (diarrhea).  Dizziness.  Feeling faint or fainting.  Trouble breathing or swallowing.  A tight feeling in the chest.  A fast heartbeat. HOW ARE ALLERGIES DIAGNOSED? Allergies can be diagnosed with:  A medical and family history.  Skin tests.  Blood tests.  A food diary. A food diary is a record of all the foods, drinks, and symptoms you have each day.  The results of an elimination diet. This diet involves making sure not to eat certain foods and then seeing what happens when you start eating them again. HOW ARE ALLERGIES TREATED? There is no cure for allergies, but allergic reactions can be treated with medicine. Severe reactions usually need to be treated at a hospital.  HOW CAN REACTIONS BE PREVENTED? The best way to prevent an allergic reaction is to avoid the thing you are allergic to. Allergy shots and medicines can also help prevent reactions in some cases.   This information is not intended to replace advice given to you by your health care provider. Make sure you discuss any questions you have with your health care provider.   Document Released: 11/16/2012  Document Revised: 08/12/2014 Document Reviewed: 05/03/2014 Elsevier Interactive Patient Education [employer]2016 Elsevier Inc.

## 2015-10-31 NOTE — ED Notes (Addendum)
Had  home health care come to house today to care for wounds from itching , now has area of redness on rt side of neck that has gotten bigger and pt has scratched it raw now and is bleeding.  Pt feels dizzy and  Feels bp is low. Wife states pt is allergic to most things and bandages, wife also states that pts left elbow is  swollen since yesterday, wife states he has seen multiple dr and dermatologist for these rashes and nothing has helped.  Saw primary dr just recently 3/17.

## 2015-10-31 NOTE — ED Notes (Signed)
Pt stable, ambulatory, states understanding of discharge instructions 

## 2015-12-19 ENCOUNTER — Emergency Department (HOSPITAL_BASED_OUTPATIENT_CLINIC_OR_DEPARTMENT_OTHER)
Admission: EM | Admit: 2015-12-19 | Discharge: 2015-12-19 | Disposition: A | Discharge status: Home / Self Care | Payer: Medicare HMO | Attending: Emergency Medicine | Admitting: Emergency Medicine

## 2015-12-19 ENCOUNTER — Encounter (HOSPITAL_BASED_OUTPATIENT_CLINIC_OR_DEPARTMENT_OTHER): Payer: Self-pay

## 2015-12-19 DIAGNOSIS — R000 Tachycardia, unspecified: Secondary | ICD-10-CM | POA: Diagnosis not present

## 2015-12-19 DIAGNOSIS — Z7984 Long term (current) use of oral hypoglycemic drugs: Secondary | ICD-10-CM | POA: Insufficient documentation

## 2015-12-19 DIAGNOSIS — R21 Rash and other nonspecific skin eruption: Secondary | ICD-10-CM | POA: Diagnosis not present

## 2015-12-19 DIAGNOSIS — E119 Type 2 diabetes mellitus without complications: Secondary | ICD-10-CM | POA: Diagnosis not present

## 2015-12-19 DIAGNOSIS — I10 Essential (primary) hypertension: Secondary | ICD-10-CM | POA: Diagnosis not present

## 2015-12-19 DIAGNOSIS — F17210 Nicotine dependence, cigarettes, uncomplicated: Secondary | ICD-10-CM | POA: Diagnosis not present

## 2015-12-19 DIAGNOSIS — Z79899 Other long term (current) drug therapy: Secondary | ICD-10-CM | POA: Diagnosis not present

## 2015-12-19 DIAGNOSIS — J45909 Unspecified asthma, uncomplicated: Secondary | ICD-10-CM | POA: Insufficient documentation

## 2015-12-19 DIAGNOSIS — Z7982 Long term (current) use of aspirin: Secondary | ICD-10-CM | POA: Diagnosis not present

## 2015-12-19 DIAGNOSIS — G5640 Causalgia of unspecified upper limb: Secondary | ICD-10-CM | POA: Insufficient documentation

## 2015-12-19 DIAGNOSIS — J449 Chronic obstructive pulmonary disease, unspecified: Secondary | ICD-10-CM | POA: Insufficient documentation

## 2015-12-19 DIAGNOSIS — M542 Cervicalgia: Secondary | ICD-10-CM | POA: Diagnosis present

## 2015-12-19 MED ORDER — DEXAMETHASONE SODIUM PHOSPHATE 10 MG/ML IJ SOLN
10.0000 mg | Freq: Once | INTRAMUSCULAR | Status: AC
  Administered 2015-12-19: 10 mg via INTRAMUSCULAR
  Filled 2015-12-19: qty 1

## 2015-12-19 MED ORDER — TRAMADOL HCL 50 MG PO TABS
100.0000 mg | ORAL_TABLET | Freq: Once | ORAL | Status: AC
  Administered 2015-12-19: 100 mg via ORAL
  Filled 2015-12-19: qty 2

## 2015-12-19 NOTE — Discharge Instructions (Signed)
Complex Regional Pain Syndrome Complex regional pain syndrome (CRPS) is a nerve disorder that causes long-lasting (chronic) pain, usually in a hand, arm, leg, or foot. CRPS usually follows an injury or trauma, such as a fracture or sprain. There are two types of CRPS:  Type 1. This type occurs after an injury or trauma with no known damage to a nerve.  Type 2. This type occurs after injury or trauma damages a nerve. There are three stages of the condition:  Stage 1. This stage, called the acute stage, may last for three months.  Stage 2. This stage, called the dystrophic stage, may last for three to 12 months.  Stage 3. This stage, called the atrophic stage, may start after one year. CRPS ranges from mild to severe. For most people CRPS is mild and recovery happens over time. For others, CRPS lasts a very long time and is debilitating. CAUSES The exact cause of CRPS is not known. RISK FACTORS You may be at increased risk if:  You are a woman.  You are approximately 60 years of age.  You have any of the following:  A family history of CRPS.  An injury or surgery.  An infection.  Cancer.  Neck problems.  A stroke.  A heart attack.  Asthma. SIGNS AND SYMPTOMS Signs and symptoms in the affected limb are different for each stage. Signs and symptoms of stage 1 include:  Burning pain.  A pins and needles sensation.  Extremely sensitive skin.  Swelling.  Joint stiffness.  Warmth and redness.  Excessive sweating.  Hair and nail growth that is faster than normal. Signs and symptoms of stage 2 include:  Spreading of pain to the whole limb.  Increased skin sensitivity.  Increased swelling and stiffness.  Coolness of the skin.  Blue discoloration of skin.  Loss of skin wrinkles.  Brittle fingernails. Signs and symptoms of stage 3 include:   Pain that spreads to other areas of the body but becomes less severe.  More stiffness, leading to loss of  motion.  Skin that is pale, dry, shiny, and tightly stretched. DIAGNOSIS There is no test to diagnose CRPS. Your health care provider will make a diagnosis based on your signs and symptoms and a physical exam. The exam may include tests to rule out other possible causes of your symptoms. Sometimes imaging tests are done, such as an MRI or bone scan. These tests check for bone changes that might indicate CRPS.  TREATMENT Early treatment may prevent CRPS from advancing past stage 1. There is no one treatment that works for everyone. Treatment options may include:  Medicines, such as:  Nonsteroidal-anti-inflammatory drugs (NSAIDS).  Steroids.  Blood pressure drugs.  Antidepressants.  Anti-seizure drugs.  Pain relievers.  Exercise.  Occupational and physical therapy.  Biofeedback.  Mental health counseling.  Numbing injections.  Spinal surgery to implant a spinal cord stimulator or a pain pump. HOME CARE INSTRUCTIONS  Take medicines only as directed by your health care provider.  Follow an exercise program as directed by your health care provider.  Maintain a healthy weight.  Keep all follow-up visits as directed by your health care provider. This is important. SEEK MEDICAL CARE IF:  Your symptoms change.  Your symptoms get worse.  You develop anxiety or depression.   This information is not intended to replace advice given to you by your health care provider. Make sure you discuss any questions you have with your health care provider.   Document Released: 07/12/2002   Document Revised: 08/12/2014 Document Reviewed: 04/18/2014 Elsevier Interactive Patient Education 2016 Elsevier Inc.  

## 2015-12-19 NOTE — ED Provider Notes (Signed)
CSN: [id number]     Arrival date & time 12/19/15  1500 History   First MD Initiated Contact with Patient 12/19/15 1524     Chief Complaint  Patient presents with  . Neck Pain     (Consider location/radiation/quality/duration/timing/severity/associated sxs/prior Treatment) HPI Comments: Patient is a 60 year old male with a history of complex regional pain syndrome type II with ongoing wounds from recurrent itching and picking due to pierced stages and pain. Patient is currently under the care of a dermatologist at [facility] to his treating this as well as waiting for referral to see pain management and a neurologist. Patient has multiple topical choices which she has been trying does not improving. He ran out of his tramadol today which he gets monthly under a pain contract which is now making his symptoms worse.  He denies any fever, spreading of the rash at this time or drainage. He is to continues to use the topical items. He feels some of this may be allergy related as well because every time he bread symptoms seem to get worse  Patient is a 60 y.o. male presenting with neck pain. The history is provided by the patient.  Neck Pain Pain location:  Occipital region Quality:  Burning, stabbing and shooting (Itching) Pain severity:  Severe Pain is:  Same all the time Onset quality: This is a chronic problem but it's been worse in the last 24 hours. Timing:  Constant Progression:  Worsening Chronicity:  Chronic Associated symptoms: no fever, no numbness and no weakness     Past Medical History  Diagnosis Date  . Hypertension   . Diabetes mellitus   . Acid reflux   . Asthma   . Constipation   . Peripheral vascular disease (HCC)   . COPD (chronic obstructive pulmonary disease) [facility])    Past Surgical History  Procedure Laterality Date  . Ankle surgery    . Iliac artery stent    . Abdominal aortagram N/A 05/10/2013    Procedure: ABDOMINAL Ronny Flurry;  Surgeon: Chuck Hint, MD;   Location: [facility] CATH LAB;  Service: Cardiovascular;  Laterality: N/A;  . Lower extremity angiogram Bilateral 05/10/2013    Procedure: LOWER EXTREMITY ANGIOGRAM;  Surgeon: Chuck Hint, MD;  Location: [facility] CATH LAB;  Service: Cardiovascular;  Laterality: Bilateral;   Family History  Problem Relation Age of Onset  . Diabetes Mother   . Diabetes Sister   . Cancer Brother   . Diabetes Brother    Social History  Substance Use Topics  . Smoking status: Current Every Day Smoker -- 3.00 packs/day for 45 years    Types: Cigarettes  . Smokeless tobacco: Never Used  . Alcohol Use: No    Review of Systems  Constitutional: Negative for fever.  Musculoskeletal: Positive for neck pain.  Neurological: Negative for weakness and numbness.  All other systems reviewed and are negative.     Allergies  Chantix; Latex; and Metformin and related  Home Medications   Prior to Admission medications   Medication Sig Start Date End Date Taking? Authorizing Provider  acetaminophen (TYLENOL) 500 MG tablet Take 1,000 mg by mouth every 6 (six) hours as needed for moderate pain.    Historical Provider, MD  albuterol (PROVENTIL) (2.5 MG/3ML) 0.083% nebulizer solution Take 2.5 mg by nebulization every 6 (six) hours as needed for wheezing or shortness of breath. Reported on 10/31/2015    Historical Provider, MD  aspirin EC 81 MG tablet Take 81 mg by mouth daily.  Historical Provider, MD  atorvastatin (LIPITOR) 20 MG tablet Take 20 mg by mouth daily at 6 PM.     Historical Provider, MD  dextromethorphan (DELSYM) 30 MG/5ML liquid Take 60 mg by mouth as needed for cough.    Historical Provider, MD  docusate sodium (COLACE) 100 MG capsule Take 200 mg by mouth 2 (two) times daily.    Historical Provider, MD  escitalopram (LEXAPRO) 10 MG tablet  09/21/15   Historical Provider, MD  ferrous sulfate 325 (65 FE) MG EC tablet Take 325 mg by mouth. 09/26/15 10/26/15  Historical Provider, MD  Fluticasone  Furoate-Vilanterol (BREO ELLIPTA) 100-25 MCG/INH AEPB Inhale 1 puff into the lungs daily as needed (for shortness of breath).    Historical Provider, MD  glipiZIDE (GLUCOTROL) 10 MG tablet Take 10 mg by mouth daily before breakfast.    Historical Provider, MD  hydrOXYzine (ATARAX/VISTARIL) 25 MG tablet Take 25 mg by mouth 3 (three) times daily as needed.    Historical Provider, MD  ibuprofen (ADVIL,MOTRIN) 200 MG tablet Take 600 mg by mouth every 6 (six) hours as needed. For pain.    Historical Provider, MD  lidocaine (LIDODERM) 5 % Place 2 patches onto the skin daily. Remove & Discard patch within 12 hours or as directed by MD    Historical Provider, MD  losartan-hydrochlorothiazide (HYZAAR) 100-12.5 MG per tablet Take 1 tablet by mouth daily.    Historical Provider, MD  Menthol, Topical Analgesic, 5 % PADS Apply 1 patch topically at bedtime as needed (for leg pain).     Historical Provider, MD  NITROSTAT 0.4 MG SL tablet Place 0.4 mg under the tongue every 5 (five) minutes as needed for chest pain.  05/08/15   Historical Provider, MD  omeprazole (PRILOSEC) 20 MG capsule Take 20 mg by mouth daily.    Historical Provider, MD  OVER THE COUNTER MEDICATION Apply 1 application topically daily as needed (WOUND CARE-THERA HONEY).    Historical Provider, MD  pioglitazone (ACTOS) 30 MG tablet Take 30 mg by mouth daily.    Historical Provider, MD  Saxagliptin-Metformin (KOMBIGLYZE XR) 2.12-998 MG TB24 Take 1 tablet by mouth 2 (two) times daily.    Historical Provider, MD  traMADol (ULTRAM) 50 MG tablet Take 50 mg by mouth every 6 (six) hours as needed. For pain    Historical Provider, MD   BP 117/66 mmHg  Pulse 115  Temp(Src) 98.5 F (36.9 C) (Oral)  Resp 20  Ht 6' (1.829 m)  Wt 220 lb (99.791 kg)  BMI 29.83 kg/m2  SpO2 97% Physical Exam  Constitutional: He is oriented to person, place, and time. He appears well-developed and well-nourished. No distress.  HENT:  Head: Normocephalic and atraumatic.    Mouth/Throat: Oropharynx is clear and moist.  Eyes: Conjunctivae and EOM are normal. Pupils are equal, round, and reactive to light.  Neck: Normal range of motion. Neck supple.  Cardiovascular: Intact distal pulses.  Tachycardia present.   Pulmonary/Chest: Effort normal. No respiratory distress.  Neurological: He is alert and oriented to person, place, and time.  Skin: Skin is warm and dry. Rash noted. No erythema.     Psychiatric: He has a normal mood and affect. His behavior is normal.  Nursing note and vitals reviewed.   ED Course  Procedures (including critical care time) Labs Review Labs Reviewed - No data to display  Imaging Review No results found. I have personally reviewed and evaluated these images and lab results as part of my medical decision-making.  EKG Interpretation None      MDM   Final diagnoses:  Complex regional pain syndrome type 2  Rash    Patient with a long history of complex regional pain syndrome type II with ongoing rash and P wrist he does that he itches to the point the skin is raw. He is been seen by a dermatologist about this too is currently treating him as well as waiting for referral for neurologist and pain management specialist. He presents today because he ran out of his tramadol and he is having intense burning and pain over the wound on the occipital portion of his head and neck. There is no evidence of infection today. The area is slightly worse per his wife. They have all the topical things they need. Patient given a dose of oral tramadol here and a dose of Decadron here as he feels that steroids in the past has helped some with the pain.  He has follow-up with his PCP tomorrow    Gwyneth SproutWhitney Rosann Gorum, MD 12/19/15 [phone]

## 2015-12-19 NOTE — ED Notes (Signed)
C/o posterior "neck burning" x years-increase in itching since last night-per wife, seen by multiple MDs including dermatologist with no relief-NAD-steady gait

## 2015-12-20 DIAGNOSIS — Z09 Encounter for follow-up examination after completed treatment for conditions other than malignant neoplasm: Secondary | ICD-10-CM | POA: Insufficient documentation

## 2016-01-03 ENCOUNTER — Encounter (HOSPITAL_COMMUNITY): Payer: Self-pay | Admitting: Emergency Medicine

## 2016-01-03 ENCOUNTER — Emergency Department (HOSPITAL_COMMUNITY): Payer: Medicare HMO

## 2016-01-03 ENCOUNTER — Emergency Department (HOSPITAL_COMMUNITY)
Admission: EM | Admit: 2016-01-03 | Discharge: 2016-01-03 | Disposition: A | Discharge status: Home / Self Care | Payer: Medicare HMO | Source: Home / Self Care | Attending: Emergency Medicine | Admitting: Emergency Medicine

## 2016-01-03 DIAGNOSIS — E119 Type 2 diabetes mellitus without complications: Secondary | ICD-10-CM

## 2016-01-03 DIAGNOSIS — J441 Chronic obstructive pulmonary disease with (acute) exacerbation: Secondary | ICD-10-CM

## 2016-01-03 DIAGNOSIS — R000 Tachycardia, unspecified: Secondary | ICD-10-CM | POA: Insufficient documentation

## 2016-01-03 DIAGNOSIS — Z7984 Long term (current) use of oral hypoglycemic drugs: Secondary | ICD-10-CM | POA: Insufficient documentation

## 2016-01-03 DIAGNOSIS — R609 Edema, unspecified: Secondary | ICD-10-CM | POA: Diagnosis not present

## 2016-01-03 DIAGNOSIS — F17210 Nicotine dependence, cigarettes, uncomplicated: Secondary | ICD-10-CM

## 2016-01-03 DIAGNOSIS — Z91040 Latex allergy status: Secondary | ICD-10-CM

## 2016-01-03 DIAGNOSIS — Z7982 Long term (current) use of aspirin: Secondary | ICD-10-CM | POA: Insufficient documentation

## 2016-01-03 DIAGNOSIS — M7989 Other specified soft tissue disorders: Secondary | ICD-10-CM

## 2016-01-03 DIAGNOSIS — Z8719 Personal history of other diseases of the digestive system: Secondary | ICD-10-CM | POA: Insufficient documentation

## 2016-01-03 DIAGNOSIS — R21 Rash and other nonspecific skin eruption: Secondary | ICD-10-CM | POA: Insufficient documentation

## 2016-01-03 DIAGNOSIS — I10 Essential (primary) hypertension: Secondary | ICD-10-CM | POA: Insufficient documentation

## 2016-01-03 DIAGNOSIS — Z79899 Other long term (current) drug therapy: Secondary | ICD-10-CM | POA: Insufficient documentation

## 2016-01-03 DIAGNOSIS — R062 Wheezing: Secondary | ICD-10-CM

## 2016-01-03 LAB — CBC WITH DIFFERENTIAL/PLATELET
Basophils Absolute: 0.1 10*3/uL (ref 0.0–0.1)
Basophils Relative: 1 %
EOS ABS: 0.6 10*3/uL (ref 0.0–0.7)
EOS PCT: 6 %
HCT: 34.4 % — ABNORMAL LOW (ref 39.0–52.0)
HEMOGLOBIN: 10.4 g/dL — AB (ref 13.0–17.0)
LYMPHS ABS: 1.8 10*3/uL (ref 0.7–4.0)
Lymphocytes Relative: 17 %
MCH: 25.2 pg — AB (ref 26.0–34.0)
MCHC: 30.2 g/dL (ref 30.0–36.0)
MCV: 83.3 fL (ref 78.0–100.0)
MONOS PCT: 7 %
Monocytes Absolute: 0.8 10*3/uL (ref 0.1–1.0)
Neutro Abs: 7.6 10*3/uL (ref 1.7–7.7)
Neutrophils Relative %: 70 %
Platelets: 506 10*3/uL — ABNORMAL HIGH (ref 150–400)
RBC: 4.13 MIL/uL — ABNORMAL LOW (ref 4.22–5.81)
RDW: 18.3 % — ABNORMAL HIGH (ref 11.5–15.5)
WBC: 10.9 10*3/uL — ABNORMAL HIGH (ref 4.0–10.5)

## 2016-01-03 LAB — COMPREHENSIVE METABOLIC PANEL
ALBUMIN: 2.7 g/dL — AB (ref 3.5–5.0)
ALK PHOS: 57 U/L (ref 38–126)
ALT: 13 U/L — AB (ref 17–63)
AST: 14 U/L — ABNORMAL LOW (ref 15–41)
Anion gap: 10 (ref 5–15)
BUN: 7 mg/dL (ref 6–20)
CALCIUM: 9.2 mg/dL (ref 8.9–10.3)
CO2: 29 mmol/L (ref 22–32)
CREATININE: 0.93 mg/dL (ref 0.61–1.24)
Chloride: 99 mmol/L — ABNORMAL LOW (ref 101–111)
GFR calc Af Amer: >60 mL/min (ref >60)
GFR calc non Af Amer: >60 mL/min (ref >60)
GLUCOSE: 125 mg/dL — AB (ref 65–99)
Potassium: 3.5 mmol/L (ref 3.5–5.1)
SODIUM: 138 mmol/L (ref 135–145)
Total Bilirubin: 0.3 mg/dL (ref 0.3–1.2)
Total Protein: 6.7 g/dL (ref 6.5–8.1)

## 2016-01-03 LAB — URINALYSIS, ROUTINE W REFLEX MICROSCOPIC
Bilirubin Urine: NEGATIVE
GLUCOSE, UA: NEGATIVE mg/dL
Hgb urine dipstick: NEGATIVE
KETONES UR: NEGATIVE mg/dL
LEUKOCYTES UA: NEGATIVE
NITRITE: NEGATIVE
PH: 6 (ref 5.0–8.0)
PROTEIN: NEGATIVE mg/dL
Specific Gravity, Urine: 1.015 (ref 1.005–1.030)

## 2016-01-03 LAB — I-STAT CG4 LACTIC ACID, ED
Lactic Acid, Venous: 1.35 mmol/L (ref 0.5–2.0)
Lactic Acid, Venous: 1.4 mmol/L (ref 0.5–2.0)

## 2016-01-03 MED ORDER — SULFAMETHOXAZOLE-TRIMETHOPRIM 200-40 MG/5ML PO SUSP: 160.0000 mg | Freq: Two times a day (BID) | ORAL | Status: AC

## 2016-01-03 MED ORDER — CEPHALEXIN 250 MG PO CAPS
500.0000 mg | ORAL_CAPSULE | Freq: Once | ORAL | Status: AC
  Administered 2016-01-03: 500 mg via ORAL
  Filled 2016-01-03: qty 2

## 2016-01-03 MED ORDER — SULFAMETHOXAZOLE-TRIMETHOPRIM 800-160 MG PO TABS
1.0000 | ORAL_TABLET | Freq: Once | ORAL | Status: AC
  Administered 2016-01-03: 1 via ORAL
  Filled 2016-01-03: qty 1

## 2016-01-03 MED ORDER — PREDNISONE 50 MG PO TABS: ORAL_TABLET | ORAL | Status: DC

## 2016-01-03 MED ORDER — CEPHALEXIN 500 MG PO CAPS: 500.0000 mg | ORAL_CAPSULE | Freq: Four times a day (QID) | ORAL | Status: DC

## 2016-01-03 MED ORDER — ALBUTEROL SULFATE (2.5 MG/3ML) 0.083% IN NEBU
5.0000 mg | INHALATION_SOLUTION | Freq: Once | RESPIRATORY_TRACT | Status: AC
  Administered 2016-01-03: 5 mg via RESPIRATORY_TRACT
  Filled 2016-01-03: qty 6

## 2016-01-03 MED ORDER — DEXAMETHASONE 4 MG PO TABS
16.0000 mg | ORAL_TABLET | Freq: Once | ORAL | Status: AC
  Administered 2016-01-03: 16 mg via ORAL
  Filled 2016-01-03: qty 4

## 2016-01-03 NOTE — ED Provider Notes (Signed)
CSN: [id number]     Arrival date & time 01/03/16  1636 History   First MD Initiated Contact with Patient 01/03/16 1854     Chief Complaint  Patient presents with  . wound drainage   . Hypotension   HPI Patient is a 60 year old male with history of hypertension, diabetes, asthma, peripheral arterial disease, anemia, COPD, chronic skin wounds presents with concern for hypotension measured by home health. Patient report blood pressure was 110/70. He also reports his O2 sat was 85% on room air. Per daughter, patient is supposed to be on home O2 but has refused. He reports he routinely is SOB with ambulation. Today, due to concern for abnormal vital signs, he was advised by his home health nurse to come to the emergency department for further evaluation. Patient denies fevers, headache, dizziness, chest pain, worsening SOB from baseline. He does have some shortness of breath and wheezing today but this improved after taking his home Breo. Patient also complains of an itchy groin rash as well as possible increased drainage from his chronic neck wounds.   Past Medical History  Diagnosis Date  . Hypertension   . Diabetes mellitus   . Acid reflux   . Asthma   . Constipation   . Peripheral vascular disease (HCC)   . COPD (chronic obstructive pulmonary disease) [facility])    Past Surgical History  Procedure Laterality Date  . Ankle surgery    . Iliac artery stent    . Abdominal aortagram N/A 05/10/2013    Procedure: ABDOMINAL Ronny Flurry;  Surgeon: Chuck Hint, MD;  Location: [facility] CATH LAB;  Service: Cardiovascular;  Laterality: N/A;  . Lower extremity angiogram Bilateral 05/10/2013    Procedure: LOWER EXTREMITY ANGIOGRAM;  Surgeon: Chuck Hint, MD;  Location: [facility] CATH LAB;  Service: Cardiovascular;  Laterality: Bilateral;   Family History  Problem Relation Age of Onset  . Diabetes Mother   . Diabetes Sister   . Cancer Brother   . Diabetes Brother    Social History  Substance Use  Topics  . Smoking status: Current Every Day Smoker -- 3.00 packs/day for 45 years    Types: Cigarettes  . Smokeless tobacco: Never Used  . Alcohol Use: No    Review of Systems  Constitutional: Negative for fever, chills and fatigue.  HENT: Negative for congestion and rhinorrhea.   Eyes: Negative for visual disturbance.  Respiratory: Positive for cough (chronic, unchanged, no sputum production), shortness of breath and wheezing.   Cardiovascular: Positive for leg swelling (chronic per patient, intermittently gets worse and better). Negative for chest pain and palpitations.  Gastrointestinal: Negative for nausea, vomiting, abdominal pain and diarrhea.  Genitourinary: Negative for dysuria and difficulty urinating.  Musculoskeletal: Negative for back pain and neck pain.  Skin: Positive for rash and wound.  Neurological: Negative for dizziness, syncope, light-headedness and headaches.  Psychiatric/Behavioral: Negative for confusion.      Allergies  Chantix; Latex; and Metformin and related  Home Medications   Prior to Admission medications   Medication Sig Start Date End Date Taking? Authorizing Provider  acetaminophen (TYLENOL) 500 MG tablet Take 1,000 mg by mouth every 6 (six) hours as needed for moderate pain.    Historical Provider, MD  albuterol (PROVENTIL) (2.5 MG/3ML) 0.083% nebulizer solution Take 2.5 mg by nebulization every 6 (six) hours as needed for wheezing or shortness of breath. Reported on 10/31/2015    Historical Provider, MD  aspirin EC 81 MG tablet Take 81 mg by mouth daily.  Historical Provider, MD  atorvastatin (LIPITOR) 20 MG tablet Take 20 mg by mouth daily at 6 PM.     Historical Provider, MD  dextromethorphan (DELSYM) 30 MG/5ML liquid Take 60 mg by mouth as needed for cough.    Historical Provider, MD  docusate sodium (COLACE) 100 MG capsule Take 200 mg by mouth 2 (two) times daily.    Historical Provider, MD  escitalopram (LEXAPRO) 10 MG tablet  09/21/15    Historical Provider, MD  ferrous sulfate 325 (65 FE) MG EC tablet Take 325 mg by mouth. 09/26/15 10/26/15  Historical Provider, MD  Fluticasone Furoate-Vilanterol (BREO ELLIPTA) 100-25 MCG/INH AEPB Inhale 1 puff into the lungs daily as needed (for shortness of breath).    Historical Provider, MD  glipiZIDE (GLUCOTROL) 10 MG tablet Take 10 mg by mouth daily before breakfast.    Historical Provider, MD  hydrOXYzine (ATARAX/VISTARIL) 25 MG tablet Take 25 mg by mouth 3 (three) times daily as needed.    Historical Provider, MD  ibuprofen (ADVIL,MOTRIN) 200 MG tablet Take 600 mg by mouth every 6 (six) hours as needed. For pain.    Historical Provider, MD  lidocaine (LIDODERM) 5 % Place 2 patches onto the skin daily. Remove & Discard patch within 12 hours or as directed by MD    Historical Provider, MD  losartan-hydrochlorothiazide (HYZAAR) 100-12.5 MG per tablet Take 1 tablet by mouth daily.    Historical Provider, MD  Menthol, Topical Analgesic, 5 % PADS Apply 1 patch topically at bedtime as needed (for leg pain).     Historical Provider, MD  NITROSTAT 0.4 MG SL tablet Place 0.4 mg under the tongue every 5 (five) minutes as needed for chest pain.  05/08/15   Historical Provider, MD  omeprazole (PRILOSEC) 20 MG capsule Take 20 mg by mouth daily.    Historical Provider, MD  OVER THE COUNTER MEDICATION Apply 1 application topically daily as needed (WOUND CARE-THERA HONEY).    Historical Provider, MD  pioglitazone (ACTOS) 30 MG tablet Take 30 mg by mouth daily.    Historical Provider, MD  Saxagliptin-Metformin (KOMBIGLYZE XR) 2.12-998 MG TB24 Take 1 tablet by mouth 2 (two) times daily.    Historical Provider, MD  traMADol (ULTRAM) 50 MG tablet Take 50 mg by mouth every 6 (six) hours as needed. For pain    Historical Provider, MD   BP 127/78 mmHg  Pulse 115  Temp(Src) 98.6 F (37 C) (Oral)  Resp 18  Ht 6' (1.829 m)  Wt 100.699 kg  BMI 30.10 kg/m2  SpO2 98% Physical Exam  Constitutional: He is oriented  to person, place, and time. He appears well-developed and well-nourished.  HENT:  Head: Normocephalic and atraumatic.  Eyes: EOM are normal. Pupils are equal, round, and reactive to light.  Neck: Normal range of motion. Neck supple.  Chronic posterior neck wounds with small amount of green drainage on bandage. Mild surrounding erythema. Two areas of skin sloughing with excoriation on upper back.  Cardiovascular: Normal rate, regular rhythm and intact distal pulses.   Pulmonary/Chest: Effort normal. No respiratory distress. He has wheezes (expiratory wheezes with prolonged expiratory phase).  Abdominal: Soft. He exhibits no distension. There is no tenderness.  Musculoskeletal: Normal range of motion. He exhibits edema (trace LE edema). He exhibits no tenderness.  Neurological: He is alert and oriented to person, place, and time.  Skin: Skin is warm and dry. Rash noted.  Psychiatric: He has a normal mood and affect.  Nursing note and vitals reviewed.  ED Course  Procedures (including critical care time) Labs Review Labs Reviewed  COMPREHENSIVE METABOLIC PANEL - Abnormal; Notable for the following:    Chloride 99 (*)    Glucose, Bld 125 (*)    Albumin 2.7 (*)    AST 14 (*)    ALT 13 (*)    All other components within normal limits  CBC WITH DIFFERENTIAL/PLATELET - Abnormal; Notable for the following:    WBC 10.9 (*)    RBC 4.13 (*)    Hemoglobin 10.4 (*)    HCT 34.4 (*)    MCH 25.2 (*)    RDW 18.3 (*)    Platelets 506 (*)    All other components within normal limits  URINALYSIS, ROUTINE W REFLEX MICROSCOPIC (NOT AT [facility])  I-STAT CG4 LACTIC ACID, ED  I-STAT CG4 LACTIC ACID, ED    Imaging Review Dg Chest 2 View  01/03/2016  CLINICAL DATA:  Slight shortness of breath.  Chest pain. EXAM: CHEST  2 VIEW COMPARISON:  October 06, 2015 FINDINGS: A calcified nodular density projects over the right lung, unchanged. This likely represents the calcified pleural plaque on the CT scan from  October 2016. No other pulmonary nodules or masses. The heart, hila, and mediastinum are normal. No pneumothorax. No acute infiltrates are identified. IMPRESSION: No acute abnormalities identified. Electronically Signed   By: Gerome Sam III M.D   On: 01/03/2016 18:02   I have personally reviewed and evaluated these images and lab results as part of my medical decision-making.   EKG Interpretation None      MDM   Final diagnoses:  Rash and nonspecific skin eruption  Wheezes  Tachycardia    66M with PMH as above presents with possible abnormal vital signs as measured by home health nurse. SBP reported at 110 prior to arrival which patient reports is slightly lower than normal. On review of past records, this appears near baseline. On presentation to the ED, patient has normal blood pressures. He has tachycardia which she reports is chronic, and on review of past medical records has been present on prior visits. O2 sats are in the low 90s. Exam significant only for diffuse expiratory wheezes. Patient reports shortness of breath is at baseline. EKG reviewed by me and shows sinus tachycardia without acute ischemic changes. CXR does not show signs of infection, pneumothorax, or fluid overload. Labs significant for mildly elevated WBC (though improved c/w prior), anemia (near baseline). CMP unremarkable. UA negative for signs of infection. Lactic acid WNL x2. Patient remained normotensive throughout. Given wheezes, albuterol nebulizer given. Patient also treated with steroids for wheezing as well as itching from his groin rash, as he reports this has worked in the past. Abx (Keflex + Bactrim) given for possible skin infection. Doubt extensive infection given exam findings.   On re-evaluation, patient reports his breathing has improved. Itching has also improved. He feels ready to go home. He has had no episodes of low blood pressure in the ED. Discharged in stable condition. Rx Keflex, Bactrim  (liquid form given at patient's request), and short course of prednisone given. Strict return precautions given. Patient in agreement with plan.   Patient seen and discussed with Dr. Verdie Mosher, ED attending        Isa Rankin, MD 01/04/16 [id number]  Lavera Guise, MD 01/06/16 [phone]

## 2016-01-03 NOTE — ED Notes (Signed)
Mr. Alejandro Jones is diabetic pt presented to ED with multiple wound around his back, neck and body, refuse to have diet coke and insisted in regular code, pt upset about having all vitals sign cords around him, no complain with treatment at this time. Dr. Katrinka BlazingSmith resident notified.

## 2016-01-03 NOTE — ED Notes (Signed)
Patient arrived by POV after home health nurse visit today. Nurse concerned with purulent drainage from neck sores. Family states that patient was bradycardic per nurse and BP 110 systolic. On assessment patient in no distress, no shortness of breath. Alert and oriented

## 2016-01-04 ENCOUNTER — Encounter (HOSPITAL_COMMUNITY): Payer: Self-pay | Admitting: Emergency Medicine

## 2016-01-04 ENCOUNTER — Inpatient Hospital Stay (HOSPITAL_COMMUNITY)
Admission: EM | Admit: 2016-01-04 | Discharge: 2016-01-05 | DRG: 191 | Discharge status: Against Medical Advice | Payer: Medicare HMO | Attending: Internal Medicine | Admitting: Internal Medicine

## 2016-01-04 ENCOUNTER — Emergency Department (HOSPITAL_COMMUNITY): Payer: Medicare HMO

## 2016-01-04 ENCOUNTER — Inpatient Hospital Stay (HOSPITAL_COMMUNITY): Payer: Medicare HMO

## 2016-01-04 DIAGNOSIS — K219 Gastro-esophageal reflux disease without esophagitis: Secondary | ICD-10-CM | POA: Diagnosis present

## 2016-01-04 DIAGNOSIS — R0602 Shortness of breath: Secondary | ICD-10-CM | POA: Diagnosis not present

## 2016-01-04 DIAGNOSIS — R600 Localized edema: Secondary | ICD-10-CM | POA: Diagnosis not present

## 2016-01-04 DIAGNOSIS — L989 Disorder of the skin and subcutaneous tissue, unspecified: Secondary | ICD-10-CM | POA: Diagnosis present

## 2016-01-04 DIAGNOSIS — R609 Edema, unspecified: Secondary | ICD-10-CM

## 2016-01-04 DIAGNOSIS — R6510 Systemic inflammatory response syndrome (SIRS) of non-infectious origin without acute organ dysfunction: Secondary | ICD-10-CM | POA: Diagnosis not present

## 2016-01-04 DIAGNOSIS — I739 Peripheral vascular disease, unspecified: Secondary | ICD-10-CM | POA: Diagnosis present

## 2016-01-04 DIAGNOSIS — J45909 Unspecified asthma, uncomplicated: Secondary | ICD-10-CM | POA: Diagnosis present

## 2016-01-04 DIAGNOSIS — E1151 Type 2 diabetes mellitus with diabetic peripheral angiopathy without gangrene: Secondary | ICD-10-CM | POA: Diagnosis present

## 2016-01-04 DIAGNOSIS — E1159 Type 2 diabetes mellitus with other circulatory complications: Secondary | ICD-10-CM | POA: Diagnosis present

## 2016-01-04 DIAGNOSIS — Z7982 Long term (current) use of aspirin: Secondary | ICD-10-CM

## 2016-01-04 DIAGNOSIS — F17210 Nicotine dependence, cigarettes, uncomplicated: Secondary | ICD-10-CM | POA: Diagnosis present

## 2016-01-04 DIAGNOSIS — J441 Chronic obstructive pulmonary disease with (acute) exacerbation: Secondary | ICD-10-CM | POA: Diagnosis not present

## 2016-01-04 DIAGNOSIS — I10 Essential (primary) hypertension: Secondary | ICD-10-CM | POA: Diagnosis present

## 2016-01-04 DIAGNOSIS — E8770 Fluid overload, unspecified: Secondary | ICD-10-CM | POA: Diagnosis present

## 2016-01-04 DIAGNOSIS — L988 Other specified disorders of the skin and subcutaneous tissue: Secondary | ICD-10-CM

## 2016-01-04 DIAGNOSIS — Z7952 Long term (current) use of systemic steroids: Secondary | ICD-10-CM | POA: Diagnosis not present

## 2016-01-04 DIAGNOSIS — W19XXXA Unspecified fall, initial encounter: Secondary | ICD-10-CM

## 2016-01-04 DIAGNOSIS — R0603 Acute respiratory distress: Secondary | ICD-10-CM

## 2016-01-04 DIAGNOSIS — R55 Syncope and collapse: Secondary | ICD-10-CM | POA: Insufficient documentation

## 2016-01-04 DIAGNOSIS — R7989 Other specified abnormal findings of blood chemistry: Secondary | ICD-10-CM

## 2016-01-04 LAB — CBC
HCT: 34.8 % — ABNORMAL LOW (ref 39.0–52.0)
HEMOGLOBIN: 10.5 g/dL — AB (ref 13.0–17.0)
MCH: 25.5 pg — ABNORMAL LOW (ref 26.0–34.0)
MCHC: 30.2 g/dL (ref 30.0–36.0)
MCV: 84.5 fL (ref 78.0–100.0)
PLATELETS: 590 10*3/uL — AB (ref 150–400)
RBC: 4.12 MIL/uL — AB (ref 4.22–5.81)
RDW: 18.4 % — ABNORMAL HIGH (ref 11.5–15.5)
WBC: 21.6 10*3/uL — AB (ref 4.0–10.5)

## 2016-01-04 LAB — I-STAT CG4 LACTIC ACID, ED
LACTIC ACID, VENOUS: 3.84 mmol/L — AB (ref 0.5–2.0)
LACTIC ACID, VENOUS: 3.96 mmol/L — AB (ref 0.5–2.0)

## 2016-01-04 LAB — BASIC METABOLIC PANEL
ANION GAP: 10 (ref 5–15)
BUN: 13 mg/dL (ref 6–20)
CALCIUM: 9.5 mg/dL (ref 8.9–10.3)
CHLORIDE: 99 mmol/L — AB (ref 101–111)
CO2: 27 mmol/L (ref 22–32)
CREATININE: 0.99 mg/dL (ref 0.61–1.24)
GFR calc non Af Amer: >60 mL/min (ref >60)
Glucose, Bld: 173 mg/dL — ABNORMAL HIGH (ref 65–99)
Potassium: 4 mmol/L (ref 3.5–5.1)
SODIUM: 136 mmol/L (ref 135–145)

## 2016-01-04 LAB — BRAIN NATRIURETIC PEPTIDE: B NATRIURETIC PEPTIDE 5: 393.3 pg/mL — AB (ref 0.0–100.0)

## 2016-01-04 LAB — I-STAT TROPONIN, ED: TROPONIN I, POC: 0.02 ng/mL (ref 0.00–0.08)

## 2016-01-04 MED ORDER — ACETAMINOPHEN 650 MG RE SUPP: 650.0000 mg | Freq: Four times a day (QID) | RECTAL | Status: DC | PRN

## 2016-01-04 MED ORDER — ALBUTEROL SULFATE (2.5 MG/3ML) 0.083% IN NEBU
5.0000 mg | INHALATION_SOLUTION | Freq: Once | RESPIRATORY_TRACT | Status: AC
  Administered 2016-01-04: 5 mg via RESPIRATORY_TRACT
  Filled 2016-01-04: qty 6

## 2016-01-04 MED ORDER — SODIUM CHLORIDE 0.9 % IV SOLN
2000.0000 mg | Freq: Once | INTRAVENOUS | Status: AC
  Administered 2016-01-04: 2000 mg via INTRAVENOUS
  Filled 2016-01-04: qty 2000

## 2016-01-04 MED ORDER — VITAMIN B-12 1000 MCG PO TABS
1000.0000 ug | ORAL_TABLET | Freq: Every day | ORAL | Status: DC
  Administered 2016-01-05: 1000 ug via ORAL
  Filled 2016-01-04: qty 1

## 2016-01-04 MED ORDER — ASPIRIN EC 81 MG PO TBEC
81.0000 mg | DELAYED_RELEASE_TABLET | Freq: Every day | ORAL | Status: DC
  Administered 2016-01-05: 81 mg via ORAL
  Filled 2016-01-04: qty 1

## 2016-01-04 MED ORDER — ACETAMINOPHEN 325 MG PO TABS
650.0000 mg | ORAL_TABLET | Freq: Four times a day (QID) | ORAL | Status: DC | PRN
  Administered 2016-01-05: 650 mg via ORAL
  Filled 2016-01-04: qty 2

## 2016-01-04 MED ORDER — ENOXAPARIN SODIUM 40 MG/0.4ML ~~LOC~~ SOLN
40.0000 mg | Freq: Every day | SUBCUTANEOUS | Status: DC
  Filled 2016-01-04: qty 0.4

## 2016-01-04 MED ORDER — TRAMADOL HCL 50 MG PO TABS
50.0000 mg | ORAL_TABLET | Freq: Four times a day (QID) | ORAL | Status: DC | PRN
  Administered 2016-01-05: 50 mg via ORAL
  Filled 2016-01-04: qty 1

## 2016-01-04 MED ORDER — IPRATROPIUM-ALBUTEROL 0.5-2.5 (3) MG/3ML IN SOLN
3.0000 mL | RESPIRATORY_TRACT | Status: DC
Start: 2016-01-05 — End: 2016-01-05
  Filled 2016-01-04: qty 3

## 2016-01-04 MED ORDER — ONDANSETRON HCL 4 MG PO TABS: 4.0000 mg | ORAL_TABLET | Freq: Four times a day (QID) | ORAL | Status: DC | PRN

## 2016-01-04 MED ORDER — VANCOMYCIN HCL IN DEXTROSE 1-5 GM/200ML-% IV SOLN
1000.0000 mg | Freq: Two times a day (BID) | INTRAVENOUS | Status: DC
Start: 2016-01-05 — End: 2016-01-05
  Administered 2016-01-05: 1000 mg via INTRAVENOUS
  Filled 2016-01-04 (×2): qty 200

## 2016-01-04 MED ORDER — ESCITALOPRAM OXALATE 10 MG PO TABS
10.0000 mg | ORAL_TABLET | Freq: Every day | ORAL | Status: DC
Start: 2016-01-05 — End: 2016-01-05
  Administered 2016-01-05: 10 mg via ORAL
  Filled 2016-01-04: qty 1

## 2016-01-04 MED ORDER — NICOTINE 14 MG/24HR TD PT24
14.0000 mg | MEDICATED_PATCH | Freq: Once | TRANSDERMAL | Status: DC
  Administered 2016-01-04: 14 mg via TRANSDERMAL
  Filled 2016-01-04: qty 1

## 2016-01-04 MED ORDER — VANCOMYCIN HCL IN DEXTROSE 1-5 GM/200ML-% IV SOLN: 1000.0000 mg | Freq: Once | INTRAVENOUS | Status: DC

## 2016-01-04 MED ORDER — GENTIAN VIOLET 2 % EX SOLN: 0.5000 mL | CUTANEOUS | Status: DC

## 2016-01-04 MED ORDER — ATORVASTATIN CALCIUM 10 MG PO TABS: 20.0000 mg | ORAL_TABLET | Freq: Every day | ORAL | Status: DC

## 2016-01-04 MED ORDER — PIPERACILLIN-TAZOBACTAM 3.375 G IVPB
3.3750 g | Freq: Three times a day (TID) | INTRAVENOUS | Status: DC
  Administered 2016-01-05 (×2): 3.375 g via INTRAVENOUS
  Filled 2016-01-04 (×4): qty 50

## 2016-01-04 MED ORDER — INSULIN ASPART 100 UNIT/ML ~~LOC~~ SOLN: 0.0000 [IU] | Freq: Three times a day (TID) | SUBCUTANEOUS | Status: DC

## 2016-01-04 MED ORDER — BUDESONIDE 0.5 MG/2ML IN SUSP
0.2500 mg | Freq: Two times a day (BID) | RESPIRATORY_TRACT | Status: DC
  Filled 2016-01-04: qty 2

## 2016-01-04 MED ORDER — METHYLPREDNISOLONE SODIUM SUCC 40 MG IJ SOLR
40.0000 mg | Freq: Two times a day (BID) | INTRAMUSCULAR | Status: DC
  Administered 2016-01-05: 40 mg via INTRAVENOUS
  Filled 2016-01-04: qty 1

## 2016-01-04 MED ORDER — LINAGLIPTIN 5 MG PO TABS
5.0000 mg | ORAL_TABLET | Freq: Every day | ORAL | Status: DC
  Filled 2016-01-04: qty 1

## 2016-01-04 MED ORDER — HYDROXYZINE PAMOATE 50 MG PO CAPS
100.0000 mg | ORAL_CAPSULE | Freq: Every day | ORAL | Status: DC
  Administered 2016-01-05: 100 mg via ORAL
  Filled 2016-01-04: qty 2

## 2016-01-04 MED ORDER — FUROSEMIDE 10 MG/ML IJ SOLN
40.0000 mg | Freq: Once | INTRAMUSCULAR | Status: AC
  Administered 2016-01-04: 40 mg via INTRAVENOUS
  Filled 2016-01-04: qty 4

## 2016-01-04 MED ORDER — PIPERACILLIN-TAZOBACTAM 3.375 G IVPB 30 MIN
3.3750 g | Freq: Once | INTRAVENOUS | Status: AC
  Administered 2016-01-04: 3.375 g via INTRAVENOUS
  Filled 2016-01-04: qty 50

## 2016-01-04 MED ORDER — NITROGLYCERIN 0.4 MG SL SUBL: 0.4000 mg | SUBLINGUAL_TABLET | SUBLINGUAL | Status: DC | PRN

## 2016-01-04 MED ORDER — SODIUM CHLORIDE 0.9 % IV BOLUS (SEPSIS)
1000.0000 mL | Freq: Once | INTRAVENOUS | Status: AC
  Administered 2016-01-04: 1000 mL via INTRAVENOUS

## 2016-01-04 MED ORDER — TRIAMCINOLONE ACETONIDE 0.1 % EX CREA
1.0000 "application " | TOPICAL_CREAM | Freq: Every day | CUTANEOUS | Status: DC | PRN
  Filled 2016-01-04: qty 15

## 2016-01-04 MED ORDER — FERROUS SULFATE 325 (65 FE) MG PO TABS
325.0000 mg | ORAL_TABLET | Freq: Every day | ORAL | Status: DC
  Administered 2016-01-05: 325 mg via ORAL
  Filled 2016-01-04 (×2): qty 1

## 2016-01-04 MED ORDER — SODIUM CHLORIDE 0.9 % IV BOLUS (SEPSIS): 500.0000 mL | Freq: Once | INTRAVENOUS | Status: DC

## 2016-01-04 MED ORDER — GLIPIZIDE 10 MG PO TABS
10.0000 mg | ORAL_TABLET | Freq: Every day | ORAL | Status: DC
  Administered 2016-01-05: 10 mg via ORAL
  Filled 2016-01-04: qty 1
  Filled 2016-01-04: qty 2

## 2016-01-04 MED ORDER — PANTOPRAZOLE SODIUM 40 MG PO TBEC
40.0000 mg | DELAYED_RELEASE_TABLET | Freq: Every day | ORAL | Status: DC
  Administered 2016-01-05: 40 mg via ORAL
  Filled 2016-01-04: qty 1

## 2016-01-04 MED ORDER — PIOGLITAZONE HCL 30 MG PO TABS
30.0000 mg | ORAL_TABLET | Freq: Every day | ORAL | Status: DC
  Administered 2016-01-05: 30 mg via ORAL
  Filled 2016-01-04: qty 1

## 2016-01-04 MED ORDER — DOCUSATE SODIUM 100 MG PO CAPS
300.0000 mg | ORAL_CAPSULE | Freq: Every day | ORAL | Status: DC
  Administered 2016-01-05: 300 mg via ORAL
  Filled 2016-01-04: qty 3

## 2016-01-04 MED ORDER — IPRATROPIUM-ALBUTEROL 0.5-2.5 (3) MG/3ML IN SOLN: 3.0000 mL | RESPIRATORY_TRACT | Status: DC | PRN

## 2016-01-04 MED ORDER — LOSARTAN POTASSIUM 50 MG PO TABS
100.0000 mg | ORAL_TABLET | Freq: Every day | ORAL | Status: DC
  Administered 2016-01-05: 100 mg via ORAL
  Filled 2016-01-04: qty 2

## 2016-01-04 MED ORDER — LIDOCAINE 5 % EX PTCH
2.0000 | MEDICATED_PATCH | Freq: Every day | CUTANEOUS | Status: DC | PRN
  Filled 2016-01-04: qty 2

## 2016-01-04 MED ORDER — SODIUM CHLORIDE 0.9 % IV SOLN
INTRAVENOUS | Status: DC
  Administered 2016-01-05: 125 mL/h via INTRAVENOUS

## 2016-01-04 MED ORDER — METHYLPREDNISOLONE SODIUM SUCC 125 MG IJ SOLR
125.0000 mg | INTRAMUSCULAR | Status: AC
  Administered 2016-01-04: 125 mg via INTRAVENOUS
  Filled 2016-01-04: qty 2

## 2016-01-04 MED ORDER — ONDANSETRON HCL 4 MG/2ML IJ SOLN: 4.0000 mg | Freq: Four times a day (QID) | INTRAMUSCULAR | Status: DC | PRN

## 2016-01-04 NOTE — ED Notes (Signed)
Pt here for multiple complaints. Was seen by home health nurse yesterday for tachycardia and chest pain, SOB, told to come here. Pt c/o CP, SOB. Pt is tachycardic, HR 119. Pt also has wounds on back, head, and neck, neck would is infected, given antibiotics. Pt also c/o itchiness, fatigue.

## 2016-01-04 NOTE — ED Notes (Signed)
Attempted to call report to [facility]. Notified by NS that floor nurse, Amalia Haileyustin, unable to take report at this time. Left number and name for return call for report.

## 2016-01-04 NOTE — ED Notes (Signed)
Hospitalist at bedside at this time 

## 2016-01-04 NOTE — ED Notes (Signed)
Dr. Krakakandy at bedside at this time.  

## 2016-01-04 NOTE — H&P (Signed)
History and Physical    Alejandro FuRickie Jones ZOX:[id number] DOB: 11/12/1955 DOA: 01/04/2016  PCP: Dennis BastABEZA,YURI, MD  Patient coming from: Home.  Chief Complaint: Shortness of breath and generalized weakness and pain.  HPI: Alejandro FuRickie Dishon is a 60 y.o. male with medical history significant of COPD, diabetes mellitus type 2, hypertension, chronic anemia was referred to the ER after patient was found to be having some shortness of breath wheezing and lower extremity edema. Patient had come to the ER a day earlier after patient's home health nurse found patient was hypotensive. At that time patient also have chronic neck wound which was having mild discharge and will discharge home on antibiotics and steroids for possible infection of the neck wound and bronchitis. Patient followed up with the PCP today and was found to be wheezing and was found to have lower extremity edema. In the ER patient was found to be wheezing tachycardic with leukocytosis and lactate was elevated. Chest x-ray was unremarkable and UA was not showing any infection. Patient has chronic skin warm more importantly on the back of the neck and has been followed with dermatologist. Patient was recently placed on gentian violet. ER physician had examined the wound and per the ER physician and there was only mild greenish excoriation. I was unable to open the wound because patient requested specific type of dressing which will be brought from home.   ED Course: Patient received 3 L fluid bolus antibiotics blood cultures. Patient also received a dose of Lasix.  Review of Systems: As per HPI otherwise 10 point review of systems negative.    Past Medical History  Diagnosis Date  . Hypertension   . Diabetes mellitus   . Acid reflux   . Asthma   . Constipation   . Peripheral vascular disease (HCC)   . COPD (chronic obstructive pulmonary disease) [facility](HCC)     Past Surgical History  Procedure Laterality Date  . Ankle surgery    . Iliac  artery stent    . Abdominal aortagram N/A 05/10/2013    Procedure: ABDOMINAL Ronny FlurryAORTAGRAM;  Surgeon: Chuck Hinthristopher S Dickson, MD;  [facility];  Service: Cardiovascular;  Laterality: N/A;  . Lower extremity angiogram Bilateral 05/10/2013    Procedure: LOWER EXTREMITY ANGIOGRAM;  Surgeon: Chuck Hinthristopher S Dickson, MD;  [facility];  Service: Cardiovascular;  Laterality: Bilateral;     reports that he has been smoking Cigarettes.  He has a 135 pack-year smoking history. He has never used smokeless tobacco. He reports that he does not drink alcohol or use illicit drugs.  Allergies  Allergen Reactions  . Chantix [Varenicline] Other (See Comments)    Extreme anger and depression   . Latex Itching and Hives  . Metformin And Related Nausea Only  . Wheat Bran Itching    WHEAT  . Bean Pod Extract Itching and Rash    PINTO BEANS  . Tape Rash    TEGADERM- TEARS THE SKIN    Family History  Problem Relation Age of Onset  . Diabetes Mother   . Diabetes Sister   . Cancer Brother   . Diabetes Brother     Prior to Admission medications   Medication Sig Start Date End Date Taking? Authorizing Provider  acetaminophen (TYLENOL) 500 MG tablet Take 1,000 mg by mouth every 6 (six) hours as needed for moderate pain.   Yes Historical Provider, MD  albuterol (PROVENTIL) (2.5 MG/3ML) 0.083% nebulizer solution Take 2.5 mg by nebulization every 6 (six) hours as needed  for wheezing or shortness of breath. Reported on 10/31/2015   Yes Historical Provider, MD  albuterol (VENTOLIN HFA) 108 (90 Base) MCG/ACT inhaler Inhale 2 puffs into the lungs every 6 (six) hours as needed for wheezing or shortness of breath.   Yes Historical Provider, MD  aspirin EC 81 MG tablet Take 81 mg by mouth daily.     Yes Historical Provider, MD  atorvastatin (LIPITOR) 20 MG tablet Take 20 mg by mouth daily at 6 PM.    Yes Historical Provider, MD  cephALEXin (KEFLEX) 500 MG capsule Take 1 capsule (500 mg total) by mouth 4  (four) times daily. 01/03/16  Yes Isa Rankin, MD  dextromethorphan (DELSYM) 30 MG/5ML liquid Take 60 mg by mouth daily as needed for cough.    Yes Historical Provider, MD  docusate sodium (COLACE) 100 MG capsule Take 300 mg by mouth at bedtime.    Yes Historical Provider, MD  escitalopram (LEXAPRO) 10 MG tablet Take 10 mg by mouth daily. 11/18/15  Yes Historical Provider, MD  ferrous sulfate 325 (65 FE) MG EC tablet Take 325 mg by mouth daily.  09/26/15 01/04/16 Yes Historical Provider, MD  Fluticasone Furoate-Vilanterol (BREO ELLIPTA) 100-25 MCG/INH AEPB Inhale 1 puff into the lungs daily as needed (for shortness of breath).   Yes Historical Provider, MD  gentian violet 2 % topical solution Apply 0.5-1 mLs topically every 3 (three) days.  12/12/15  Yes Historical Provider, MD  glipiZIDE (GLUCOTROL) 10 MG tablet Take 10 mg by mouth daily before breakfast.   Yes Historical Provider, MD  hydrOXYzine (VISTARIL) 50 MG capsule Take 100 mg by mouth at bedtime. 12/20/15 03/19/16 Yes Historical Provider, MD  ibuprofen (ADVIL,MOTRIN) 200 MG tablet Take 600 mg by mouth every 6 (six) hours as needed. For pain.   Yes Historical Provider, MD  lidocaine (LIDODERM) 5 % Place 2 patches onto the skin daily as needed (for pain). Remove & Discard patch within 12 hours or as directed by MD   Yes Historical Provider, MD  losartan-hydrochlorothiazide (HYZAAR) 100-12.5 MG per tablet Take 1 tablet by mouth daily.   Yes Historical Provider, MD  MAGNESIUM PO Take 1 tablet by mouth daily.   Yes Historical Provider, MD  Menthol, Topical Analgesic, 5 % PADS Apply 1 patch topically at bedtime as needed (for leg pain).    Yes Historical Provider, MD  Menthol-Methyl Salicylate (MUSCLE RUB EX) Use as directed 12/13/15  Yes Historical Provider, MD  NITROSTAT 0.4 MG SL tablet Place 0.4 mg under the tongue every 5 (five) minutes as needed for chest pain.  05/08/15  Yes Historical Provider, MD  omeprazole (PRILOSEC) 20 MG capsule Take 20 mg by  mouth daily.   Yes Historical Provider, MD  pioglitazone (ACTOS) 30 MG tablet Take 30 mg by mouth daily.   Yes Historical Provider, MD  predniSONE (DELTASONE) 50 MG tablet Take 1 tablet daily 01/04/16  Yes Isa Rankin, MD  Saxagliptin-Metformin (KOMBIGLYZE XR) 2.12-998 MG TB24 Take 1 tablet by mouth 2 (two) times daily.   Yes Historical Provider, MD  sulfamethoxazole-trimethoprim (BACTRIM,SEPTRA) 200-40 MG/5ML suspension Take 20 mLs (160 mg of trimethoprim total) by mouth 2 (two) times daily. 01/03/16 01/10/16 Yes Isa Rankin, MD  traMADol (ULTRAM) 50 MG tablet Take 50 mg by mouth every 6 (six) hours as needed. For pain   Yes Historical Provider, MD  triamcinolone cream (KENALOG) 0.1 % Apply 1 application topically daily as needed.  11/17/15  Yes Historical Provider, MD  vitamin B-12 (  CYANOCOBALAMIN) 1000 MCG tablet Take 1,000 mcg by mouth daily.   Yes Historical Provider, MD    Physical Exam: Filed Vitals:   01/04/16 2115 01/04/16 2130 01/04/16 2145 01/04/16 2215  BP: 131/73 140/96 134/72 125/63  Pulse: 111 131 115 109  Temp:      TempSrc:      Resp:   20 21  SpO2: 99% 97% 98% 95%      Constitutional: Not in distress. Filed Vitals:   01/04/16 2115 01/04/16 2130 01/04/16 2145 01/04/16 2215  BP: 131/73 140/96 134/72 125/63  Pulse: 111 131 115 109  Temp:      TempSrc:      Resp:   20 21  SpO2: 99% 97% 98% 95%   Eyes: Anicteric no pallor. ENMT: No discharge from the ears eyes nose and mouth. Neck: No mass felt. No JVD appreciated. Respiratory: Mild expiratory wheeze heard no crepitations. Cardiovascular: S1-S2 heard. Abdomen: Soft nontender bowel sounds present. Musculoskeletal: Bilateral lower extremity edema. Skin: Multiple skin wounds most importantly on the back of the neck. Neurologic: Alert awake oriented to time place and person. Moves all extremities. Psychiatric: Appears normal.   Labs on Admission: I have personally reviewed following labs and imaging  studies  CBC:  Recent Labs Lab 01/03/16 1724 01/04/16 1732  WBC 10.9* 21.6*  NEUTROABS 7.6  --   HGB 10.4* 10.5*  HCT 34.4* 34.8*  MCV 83.3 84.5  PLT 506* 590*   Basic Metabolic Panel:  Recent Labs Lab 01/03/16 1724 01/04/16 1732  NA 138 136  K 3.5 4.0  CL 99* 99*  CO2 29 27  GLUCOSE 125* 173*  BUN 7 13  CREATININE 0.93 0.99  CALCIUM 9.2 9.5   GFR: Estimated Creatinine Clearance: 95 mL/min (by C-G formula based on Cr of 0.99). Liver Function Tests:  Recent Labs Lab 01/03/16 1724  AST 14*  ALT 13*  ALKPHOS 57  BILITOT 0.3  PROT 6.7  ALBUMIN 2.7*   No results for input(s): LIPASE, AMYLASE in the last 168 hours. No results for input(s): AMMONIA in the last 168 hours. Coagulation Profile: No results for input(s): INR, PROTIME in the last 168 hours. Cardiac Enzymes: No results for input(s): CKTOTAL, CKMB, CKMBINDEX, TROPONINI in the last 168 hours. BNP (last 3 results) No results for input(s): PROBNP in the last 8760 hours. HbA1C: No results for input(s): HGBA1C in the last 72 hours. CBG: No results for input(s): GLUCAP in the last 168 hours. Lipid Profile: No results for input(s): CHOL, HDL, LDLCALC, TRIG, CHOLHDL, LDLDIRECT in the last 72 hours. Thyroid Function Tests: No results for input(s): TSH, T4TOTAL, FREET4, T3FREE, THYROIDAB in the last 72 hours. Anemia Panel: No results for input(s): VITAMINB12, FOLATE, FERRITIN, TIBC, IRON, RETICCTPCT in the last 72 hours. Urine analysis:    Component Value Date/Time   COLORURINE YELLOW 01/03/2016 1921   APPEARANCEUR CLEAR 01/03/2016 1921   LABSPEC 1.015 01/03/2016 1921   PHURINE 6.0 01/03/2016 1921   GLUCOSEU NEGATIVE 01/03/2016 1921   HGBUR NEGATIVE 01/03/2016 1921   BILIRUBINUR NEGATIVE 01/03/2016 1921   KETONESUR NEGATIVE 01/03/2016 1921   PROTEINUR NEGATIVE 01/03/2016 1921   UROBILINOGEN 1.0 06/01/2015 1848   NITRITE NEGATIVE 01/03/2016 1921   LEUKOCYTESUR NEGATIVE 01/03/2016 1921   Sepsis  Labs: (procalcitonin:4,lacticidven:4) )No results found for this or any previous visit (from the past 240 hour(s)).   Radiological Exams on Admission: Dg Chest 2 View  01/04/2016  CLINICAL DATA:  Right-sided chest pain. EXAM: CHEST  2 VIEW COMPARISON:  01/03/2016 FINDINGS: Cardiomediastinal silhouette is normal. Mediastinal contours appear intact. There is no evidence of focal airspace consolidation, pleural effusion or pneumothorax. Osseous structures are without acute abnormality. Soft tissues are grossly normal. IMPRESSION: No active cardiopulmonary disease. Electronically Signed   By: Ted Mcalpine M.D.   On: 01/04/2016 18:08   Dg Chest 2 View  01/03/2016  CLINICAL DATA:  Slight shortness of breath.  Chest pain. EXAM: CHEST  2 VIEW COMPARISON:  October 06, 2015 FINDINGS: A calcified nodular density projects over the right lung, unchanged. This likely represents the calcified pleural plaque on the CT scan from October 2016. No other pulmonary nodules or masses. The heart, hila, and mediastinum are normal. No pneumothorax. No acute infiltrates are identified. IMPRESSION: No acute abnormalities identified. Electronically Signed   By: Gerome Sam III M.D   On: 01/03/2016 18:02    EKG: Independently reviewed. Sinus tachycardia.  Assessment/Plan Principal Problem:   SIRS (systemic inflammatory response syndrome) (HCC) Active Problems:   PVD (peripheral vascular disease) (HCC)   Essential hypertension   COPD exacerbation (HCC)   Type 2 diabetes mellitus with vascular disease (HCC)   Lower extremity edema    #1. SIRS - source not clear. For now I have placed patient on empiric antibiotics. Check blood cultures urine cultures check procalcitonin follow lactic acid levels continue with hydration. Note that patient does have lower extremity edema so caution about hydration. #2. COPD exacerbation - patient has been placed on steroids nebulizer Pulmicort and antibiotics. Tobacco  cessation counseling. #3. Lower extremity edema - patient probably could be having cor pulmonale. Check 2-D echo. Check lower extremity Doppler. Patient may eventually need Lasix but at this time lactic acid is elevated so rehydrating. #4. Diabetes mellitus type 2 - patient is on Actos and glipizide which will be continued. Hold metformin and continue Januvia. Sliding scale coverage. Patient is on steroids so likely blood sugars are going to go up. #5. Hypertension - on losartan and hydrochlorothiazide. Will hold hydrochlorothiazide since patient is receiving fluids. #6. History of peripheral vascular disease status post stenting - denies any pain in the lower extremities. #7. Chronic skin lesions on the back and multiple areas being followed by dermatologist for possible allergic reactions. I have consulted wound team. This could also be the source of infection.   DVT prophylaxis: Lovenox. Code Status: Full code.  Family Communication: Patient's daughter and son.  Disposition Plan: Home.  Consults called: Wound team.  Admission status: Inpatient. Stepdown. Likely stay 2-3 days.    Eduard Clos MD [facility] Pager [phone].  If 7PM-7AM, please contact night-coverage [email or web address] Password [email or web address]  01/04/2016, 11:05 PM

## 2016-01-04 NOTE — ED Notes (Signed)
Patient pulled out IV in R wrist.

## 2016-01-04 NOTE — ED Notes (Signed)
Pt returned from CT °

## 2016-01-04 NOTE — ED Notes (Signed)
Patient transported to CT via stretcher.

## 2016-01-04 NOTE — Progress Notes (Signed)
Pharmacy Antibiotic Note  Alejandro Jones is a 60 y.o. male admitted on 01/04/2016 with tachycardia, SOB, wounds on head, neck and back.  Pharmacy has been consulted for vancomycin, zosyn dosing. SCr wnl, LA 3.8, WBC 21.6.   Plan: -Vancomycin 2 g IV x1 then 1g/12h -Zosyn 3.375 g IV q8h -Monitor renal fx, cultures, duration of therapy      Temp (24hrs), Avg:98.5 F (36.9 C), Min:98.3 F (36.8 C), Max:98.6 F (37 C)   Recent Labs Lab 01/03/16 1724 01/03/16 1726 01/03/16 2008 01/04/16 1732 01/04/16 1744  WBC 10.9*  --   --  21.6*  --   CREATININE 0.93  --   --  0.99  --   LATICACIDVEN  --  1.35 1.40  --  3.84*    Estimated Creatinine Clearance: 95 mL/min (by C-G formula based on Cr of 0.99).    Allergies  Allergen Reactions  . Chantix [Varenicline] Other (See Comments)    Extreme anger and depression   . Latex Itching and Hives  . Metformin And Related Nausea Only  . Wheat Bran Itching    WHEAT  . Bean Pod Extract Itching and Rash    PINTO BEANS  . Tape Rash    TEGADERM- TEARS THE SKIN    Antimicrobials this admission: 6/1 vancomycin  > 6/1 zosyn >  Dose adjustments this admission: NA  Microbiology results: 6/1 blood cx:  Thank you for allowing pharmacy to be a part of this patient's care.  Baldemar FridayMasters, Meha Vidrine M 01/04/2016 6:50 PM

## 2016-01-04 NOTE — ED Provider Notes (Signed)
CSN: [id number]     Arrival date & time 01/04/16  1713 History   First MD Initiated Contact with Patient 01/04/16 1817     Chief Complaint  Patient presents with  . Chest Pain  . Shortness of Breath   HPI Patient presents one day after being evaluated for similar concerns, now with worsening dyspnea, generalized weakness. Symptoms have been present for some time, worse over the past few days, and since discharge yesterday, after brief improvement have been progressively incapacitating. Patient has had 3 falls since discharge yesterday secondary to weakness. He denies loss of consciousness, has no new painful areas, though he did strike his head, neck. He denies any asymmetric weakness anywhere, confusion, disorientation, vision changes. Family states the patient has inconsistent medication compliance, including breathing medication. Patient has been using home oxygen since discharge from the emergency department yesterday.    Past Medical History  Diagnosis Date  . Hypertension   . Diabetes mellitus   . Acid reflux   . Asthma   . Constipation   . Peripheral vascular disease (HCC)   . COPD (chronic obstructive pulmonary disease) [facility])    Past Surgical History  Procedure Laterality Date  . Ankle surgery    . Iliac artery stent    . Abdominal aortagram N/A 05/10/2013    Procedure: ABDOMINAL Ronny Flurry;  Surgeon: Chuck Hint, MD;  Location: [facility] CATH LAB;  Service: Cardiovascular;  Laterality: N/A;  . Lower extremity angiogram Bilateral 05/10/2013    Procedure: LOWER EXTREMITY ANGIOGRAM;  Surgeon: Chuck Hint, MD;  Location: [facility] CATH LAB;  Service: Cardiovascular;  Laterality: Bilateral;   Family History  Problem Relation Age of Onset  . Diabetes Mother   . Diabetes Sister   . Cancer Brother   . Diabetes Brother    Social History  Substance Use Topics  . Smoking status: Current Every Day Smoker -- 3.00 packs/day for 45 years    Types: Cigarettes  . Smokeless  tobacco: Never Used  . Alcohol Use: No    Review of Systems  Constitutional:       Per HPI, otherwise negative  HENT:       Per HPI, otherwise negative  Respiratory:       Per HPI, otherwise negative  Cardiovascular:       Per HPI, otherwise negative  Gastrointestinal: Negative for vomiting.  Endocrine:       Negative aside from HPI  Genitourinary:       Neg aside from HPI   Musculoskeletal:       Per HPI, otherwise negative  Skin: Positive for wound.       Chronic wounds about the habitus, no changes today.  Neurological: Positive for weakness and light-headedness. Negative for syncope.      Allergies  Chantix; Latex; Metformin and related; Wheat bran; Bean pod extract; and Tape  Home Medications   Prior to Admission medications   Medication Sig Start Date End Date Taking? Authorizing Provider  acetaminophen (TYLENOL) 500 MG tablet Take 1,000 mg by mouth every 6 (six) hours as needed for moderate pain.    Historical Provider, MD  albuterol (PROVENTIL) (2.5 MG/3ML) 0.083% nebulizer solution Take 2.5 mg by nebulization every 6 (six) hours as needed for wheezing or shortness of breath. Reported on 10/31/2015    Historical Provider, MD  albuterol (VENTOLIN HFA) 108 (90 Base) MCG/ACT inhaler Inhale 2 puffs into the lungs every 6 (six) hours as needed for wheezing or shortness of breath.  Historical Provider, MD  aspirin EC 81 MG tablet Take 81 mg by mouth daily.      Historical Provider, MD  atorvastatin (LIPITOR) 20 MG tablet Take 20 mg by mouth daily at 6 PM.     Historical Provider, MD  cephALEXin (KEFLEX) 500 MG capsule Take 1 capsule (500 mg total) by mouth 4 (four) times daily. 01/03/16   Isa Rankin, MD  dextromethorphan (DELSYM) 30 MG/5ML liquid Take 60 mg by mouth daily as needed for cough.     Historical Provider, MD  docusate sodium (COLACE) 100 MG capsule Take 300 mg by mouth at bedtime.     Historical Provider, MD  escitalopram (LEXAPRO) 10 MG tablet Take 10 mg by  mouth daily. 11/18/15   Historical Provider, MD  ferrous sulfate 325 (65 FE) MG EC tablet Take 325 mg by mouth daily.  09/26/15 10/26/15  Historical Provider, MD  Fluticasone Furoate-Vilanterol (BREO ELLIPTA) 100-25 MCG/INH AEPB Inhale 1 puff into the lungs daily as needed (for shortness of breath).    Historical Provider, MD  gentian violet 2 % topical solution Apply 0.5-1 mLs topically every 3 (three) days.  12/12/15   Historical Provider, MD  glipiZIDE (GLUCOTROL) 10 MG tablet Take 10 mg by mouth daily before breakfast.    Historical Provider, MD  hydrOXYzine (VISTARIL) 50 MG capsule Take 100 mg by mouth at bedtime. 12/20/15 03/19/16  Historical Provider, MD  ibuprofen (ADVIL,MOTRIN) 200 MG tablet Take 600 mg by mouth every 6 (six) hours as needed. For pain.    Historical Provider, MD  lidocaine (LIDODERM) 5 % Place 2 patches onto the skin daily as needed (for pain). Remove & Discard patch within 12 hours or as directed by MD    Historical Provider, MD  losartan-hydrochlorothiazide (HYZAAR) 100-12.5 MG per tablet Take 1 tablet by mouth daily.    Historical Provider, MD  MAGNESIUM PO Take 1 tablet by mouth daily.    Historical Provider, MD  Menthol, Topical Analgesic, 5 % PADS Apply 1 patch topically at bedtime as needed (for leg pain).     Historical Provider, MD  Menthol-Methyl Salicylate (MUSCLE RUB EX) Use as directed 12/13/15   Historical Provider, MD  NITROSTAT 0.4 MG SL tablet Place 0.4 mg under the tongue every 5 (five) minutes as needed for chest pain.  05/08/15   Historical Provider, MD  omeprazole (PRILOSEC) 20 MG capsule Take 20 mg by mouth daily.    Historical Provider, MD  pioglitazone (ACTOS) 30 MG tablet Take 30 mg by mouth daily.    Historical Provider, MD  predniSONE (DELTASONE) 50 MG tablet Take 1 tablet daily 01/04/16   Isa Rankin, MD  Saxagliptin-Metformin (KOMBIGLYZE XR) 2.12-998 MG TB24 Take 1 tablet by mouth 2 (two) times daily.    Historical Provider, MD   sulfamethoxazole-trimethoprim (BACTRIM,SEPTRA) 200-40 MG/5ML suspension Take 20 mLs (160 mg of trimethoprim total) by mouth 2 (two) times daily. 01/03/16 01/10/16  Isa Rankin, MD  traMADol (ULTRAM) 50 MG tablet Take 50 mg by mouth every 6 (six) hours as needed. For pain    Historical Provider, MD  triamcinolone cream (KENALOG) 0.1 % Apply 1 application topically daily as needed.  11/17/15   Historical Provider, MD  vitamin B-12 (CYANOCOBALAMIN) 1000 MCG tablet Take 1,000 mcg by mouth daily.    Historical Provider, MD   BP 105/66 mmHg  Pulse 110  Temp(Src) 98.6 F (37 C) (Oral)  Resp 19  SpO2 100% Physical Exam  Constitutional: He is oriented to  person, place, and time. He appears well-developed and well-nourished.  HENT:  Head: Normocephalic and atraumatic.  Eyes: EOM are normal. Pupils are equal, round, and reactive to light.  Neck: Normal range of motion. Neck supple.  Chronic posterior neck wounds with small amount of green drainage on bandage. Mild surrounding erythema. Two areas of skin sloughing with excoriation on upper back.  Cardiovascular: Regular rhythm and intact distal pulses.   Pulmonary/Chest: Tachypnea noted. He has wheezes (expiratory wheezes with prolonged expiratory phase).  Abdominal: Soft. He exhibits no distension. There is no tenderness.  Musculoskeletal: Normal range of motion. He exhibits edema (trace LE edema). He exhibits no tenderness.  Neurological: He is alert and oriented to person, place, and time.  Skin: Skin is warm and dry. Rash noted.  Psychiatric: He has a normal mood and affect.  Nursing note and vitals reviewed.   ED Course  Procedures (including critical care time) Labs Review Labs Reviewed  BASIC METABOLIC PANEL - Abnormal; Notable for the following:    Chloride 99 (*)    Glucose, Bld 173 (*)    All other components within normal limits  CBC - Abnormal; Notable for the following:    WBC 21.6 (*)    RBC 4.12 (*)    Hemoglobin 10.5 (*)     HCT 34.8 (*)    MCH 25.5 (*)    RDW 18.4 (*)    Platelets 590 (*)    All other components within normal limits  BRAIN NATRIURETIC PEPTIDE - Abnormal; Notable for the following:    B Natriuretic Peptide 393.3 (*)    All other components within normal limits  I-STAT CG4 LACTIC ACID, ED - Abnormal; Notable for the following:    Lactic Acid, Venous 3.84 (*)    All other components within normal limits  CULTURE, BLOOD (ROUTINE X 2)  CULTURE, BLOOD (ROUTINE X 2)  I-STAT TROPOININ, ED  I-STAT CG4 LACTIC ACID, ED  Initial labs notable for lactic acidosis, 3.5. With persistent tachycardia, respiratory difficulty, patient was treated for sepsis empirically with broad-spectrum antibiotics, blood cultures obtained.     Imaging Review Dg Chest 2 View  01/04/2016  CLINICAL DATA:  Right-sided chest pain. EXAM: CHEST  2 VIEW COMPARISON:  01/03/2016 FINDINGS: Cardiomediastinal silhouette is normal. Mediastinal contours appear intact. There is no evidence of focal airspace consolidation, pleural effusion or pneumothorax. Osseous structures are without acute abnormality. Soft tissues are grossly normal. IMPRESSION: No active cardiopulmonary disease. Electronically Signed   By: Ted Mcalpineobrinka  Dimitrova M.D.   On: 01/04/2016 18:08   Dg Chest 2 View  01/03/2016  CLINICAL DATA:  Slight shortness of breath.  Chest pain. EXAM: CHEST  2 VIEW COMPARISON:  October 06, 2015 FINDINGS: A calcified nodular density projects over the right lung, unchanged. This likely represents the calcified pleural plaque on the CT scan from October 2016. No other pulmonary nodules or masses. The heart, hila, and mediastinum are normal. No pneumothorax. No acute infiltrates are identified. IMPRESSION: No acute abnormalities identified. Electronically Signed   By: Gerome Samavid  Williams III M.D   On: 01/03/2016 18:02   I have personally reviewed and evaluated these images and lab results as part of my medical decision-making.   EKG  Interpretation   Date/Time:  Thursday January 04 2016 17:21:28 EDT Ventricular Rate:  118 PR Interval:  126 QRS Duration: 80 QT Interval:  346 QTC Calculation: 484 R Axis:   69 Text Interpretation:  Sinus tachycardia ST-t wave abnormality Artifact  Abnormal ekg Confirmed by  Gerhard Munch  MD [id number]) on 01/04/2016 6:37:51  PM     After the initial valuation patient remains dyspneic. Continued to receive supplemental oxygen, will require additional albuterol.  Remaining labs notable for elevated BNP, leukocytosis. Patient has no evidence for congestive heart failure on chart review, but with concern given his elevated BNP, edematous state, patient will receive Lasix, 40 mg.    MDM   Final diagnoses:  Respiratory distress  Elevated brain natriuretic peptide (BNP) level  SIRS (systemic inflammatory response syndrome) (HCC)  Patient presents in respiratory distress, with concerns for diffuse pain, weakness. The patient had a bilateral wheezing, is grossly fluid overloaded, and there is immediate suspicion for congestive heart failure. However, the patient also has leukocytosis, lactic acidosis, and with multiple surgical criteria, was treated empirically for sepsis, though no obvious source is identified. Patient required multiple breathing treatments, in addition to steroids, antibiotics, fluids, required admission to the stepdown unit for further evaluation and management.  CRITICAL CARE Performed by: Gerhard Munch Total critical care time: 40 minutes Critical care time was exclusive of separately billable procedures and treating other patients. Critical care was necessary to treat or prevent imminent or life-threatening deterioration. Critical care was time spent personally by me on the following activities: development of treatment plan with patient and/or surrogate as well as nursing, discussions with consultants, evaluation of patient's response to treatment, examination of  patient, obtaining history from patient or surrogate, ordering and performing treatments and interventions, ordering and review of laboratory studies, ordering and review of radiographic studies, pulse oximetry and re-evaluation of patient's condition.    Gerhard Munch, MD 01/04/16 2128

## 2016-01-04 NOTE — ED Notes (Signed)
Charge nurse notified of patients lactic acid. Pt will be moved to next available room.

## 2016-01-05 ENCOUNTER — Inpatient Hospital Stay (HOSPITAL_COMMUNITY): Payer: Medicare HMO

## 2016-01-05 DIAGNOSIS — R6510 Systemic inflammatory response syndrome (SIRS) of non-infectious origin without acute organ dysfunction: Secondary | ICD-10-CM

## 2016-01-05 DIAGNOSIS — R609 Edema, unspecified: Secondary | ICD-10-CM

## 2016-01-05 LAB — APTT: aPTT: 31 seconds (ref 24–37)

## 2016-01-05 LAB — GLUCOSE, CAPILLARY
GLUCOSE-CAPILLARY: 232 mg/dL — AB (ref 65–99)
GLUCOSE-CAPILLARY: 104 mg/dL — AB (ref 65–99)

## 2016-01-05 LAB — TROPONIN I
TROPONIN I: 0.03 ng/mL (ref <0.031)
Troponin I: 0.05 ng/mL — ABNORMAL HIGH (ref <0.031)

## 2016-01-05 LAB — PROTIME-INR
INR: 0.99 (ref 0.00–1.49)
PROTHROMBIN TIME: 13.3 s (ref 11.6–15.2)

## 2016-01-05 LAB — PROCALCITONIN: Procalcitonin: <0.1 ng/mL

## 2016-01-05 LAB — LACTIC ACID, PLASMA: Lactic Acid, Venous: 3.5 mmol/L (ref 0.5–2.0)

## 2016-01-05 LAB — TSH: TSH: 0.294 u[IU]/mL — AB (ref 0.350–4.500)

## 2016-01-05 LAB — MRSA PCR SCREENING: MRSA BY PCR: NEGATIVE

## 2016-01-05 NOTE — Consult Note (Signed)
WOC wound consult note Reason for Consult: multiple wounds Patient with wife and son at the bedside. He has complex wound history. He has evidence of scarring over his entire body from previous wounds.  When asked how long the wounds have been present he states " 200 years". He has been seen my multiple dermatologist.  No confirmed diagnosis per wife and patient.  He has severe itching and uses several OTC creams for this.  He is currently using ginseng violet on some of the wounds per recommendations from his current dermatologist which is routinely used for fungal skin infections as an antiseptic.  He reports he is to be seen by neurologist and pain center to assist with what sounds like peripheral neuropathy and nerve related pain associated with these wounds.  Review of chart, punch bx in October of the neck wound only reports inflammatory changes related to excoriation which family does report that this patient is constantly scratching whole body. Wound type: partial thickness skin disruption neck (extensive), right arm x 2, back (thorasic and lumbar), head x 2 Pressure Ulcer POA: No Measurement/wound bed: neck: 12cm x 12cm x 0.1; pink with yellow exudate, no odor, not purulent Right arm; both less than 1cm x 0.1cm healing, pink, no drainage Head: both less than 1cm x 0.1cm, pink, healing, no drainage Back/thorasic 4cm x 3cm x 0.1cm; pink, healing, minimal drainage, with some evidence of use of ginseng violent  Lumbar: 1.5cm x 2.5cm x 0.1cm; pink, healing, minimal drainage, with some evident of use of ginseng violent Wound bed: see above Drainage (amount, consistency, odor) see above Periwound: evidence of scarring over the arms, trunk Dressing procedure/placement/frequency: This patient has many, many sensitivities to meds and topical treatments.  We have discussed options.    I have suggested use of silver product on the neck lesion because silver is a good antibacterial and has been helpful for  many patients with recalcitrant type wounds. However silver is not helpful if he does not allow it to stay in place for several days.  I have provided samples for the patient to trial both absorptive and a contact layer.  Patient has been sensitive to silver  Sulfadiazine, so I have let them know that he may or may not be able to use the product but they would still like to try. Samples provided.  Discussed POC with patient and bedside nurse.  Re consult if needed, will not follow at this time. Thanks  Samatha [employer] RN, CWOCN [phone])

## 2016-01-05 NOTE — Discharge Summary (Signed)
Physician Discharge Summary  Alejandro Jones ZOX:[id number] DOB: May 30, 1956 DOA: 01/04/2016  PCP: Dennis Bast, MD  Admit date: 01/04/2016 Discharge date: 01/05/2016   Recommendations for Outpatient Follow-Up:   1. LEFT AMA 2. -went over lab results and need for further hospital management with both patient and family---- patient refused to stay despite elevated WBC count, appearance of LE edema/suspected CHF.  Patient understood risks but still left AMA   Discharge Diagnosis:   Principal Problem:   SIRS (systemic inflammatory response syndrome) (HCC) Active Problems:   PVD (peripheral vascular disease) (HCC)   Essential hypertension   COPD exacerbation (HCC)   Type 2 diabetes mellitus with vascular disease (HCC)   Lower extremity edema     Discharge Exam:   Filed Vitals:   01/05/16 0736 01/05/16 1100  BP: 149/85   Pulse: 101   Temp:  98.3 F (36.8 C)  Resp: 16    Filed Vitals:   01/05/16 0457 01/05/16 0704 01/05/16 0736 01/05/16 1100  BP: 153/74  149/85   Pulse: 114  101   Temp: 98.3 F (36.8 C) 97.7 F (36.5 C)  98.3 F (36.8 C)  TempSrc: Oral Oral  Oral  Resp: 20  16   Height: 6' (1.829 m)     Weight: 107.1 kg (236 lb 1.8 oz)     SpO2: 95%  96%       The results of significant diagnostics from this hospitalization (including imaging, microbiology, ancillary and laboratory) are listed below for reference.     Procedures and Diagnostic Studies:   Dg Chest 2 View  01/04/2016  CLINICAL DATA:  Right-sided chest pain. EXAM: CHEST  2 VIEW COMPARISON:  01/03/2016 FINDINGS: Cardiomediastinal silhouette is normal. Mediastinal contours appear intact. There is no evidence of focal airspace consolidation, pleural effusion or pneumothorax. Osseous structures are without acute abnormality. Soft tissues are grossly normal. IMPRESSION: No active cardiopulmonary disease. Electronically Signed   By: Ted Mcalpine M.D.   On: 01/04/2016 18:08   Ct Head Wo  Contrast  01/04/2016  CLINICAL DATA:  Pain after fall EXAM: CT HEAD WITHOUT CONTRAST CT CERVICAL SPINE WITHOUT CONTRAST TECHNIQUE: Multidetector CT imaging of the head and cervical spine was performed following the standard protocol without intravenous contrast. Multiplanar CT image reconstructions of the cervical spine were also generated. COMPARISON:  October 05, 2015 FINDINGS: CT HEAD FINDINGS Opacification of the lower mastoid air cells is again seen, also present in March of 2017 with no associated erosion or overlying soft tissue abnormalities. The middle ears are well aerated. Visualized bones are normal. Extracranial soft tissues are unremarkable. Probable sebaceous cyst in the left side of the scalp on image 12, unchanged. No subdural, epidural, or subarachnoid hemorrhage is identified. The cerebellum, brainstem, and basal cisterns are normal. No acute cortical ischemia or infarct. No mass, mass effect, or midline shift. Ventricles and sulci are unremarkable for age. CT CERVICAL SPINE FINDINGS Evaluation of the cervical spine is somewhat limited due to mild motion. Images are also noisey and limited below the level of C5. There is reversal of normal lordosis centered at C5-6, unchanged. 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 No acute fractures are seen. Degenerative changes seen most prominent CT C5-6. A posterior disc osteophyte complex at C5-6 narrows the canal to 9 mm, unchanged in the interval. No other soft tissue abnormalities. IMPRESSION: 1. No acute intracranial process. 2. Persistent and stable opacification of mastoid air cells without erosion or overlying soft tissue  abnormality. 3. No fracture or malalignment seen in the cervical spine. Images are somewhat limited below the level of C5 as described above. Electronically Signed   By: Gerome Sam III M.D   On: 01/04/2016 23:16   Ct Cervical Spine Wo Contrast  01/04/2016  CLINICAL DATA:  Pain  after fall EXAM: CT HEAD WITHOUT CONTRAST CT CERVICAL SPINE WITHOUT CONTRAST TECHNIQUE: Multidetector CT imaging of the head and cervical spine was performed following the standard protocol without intravenous contrast. Multiplanar CT image reconstructions of the cervical spine were also generated. COMPARISON:  October 05, 2015 FINDINGS: CT HEAD FINDINGS Opacification of the lower mastoid air cells is again seen, also present in March of 2017 with no associated erosion or overlying soft tissue abnormalities. The middle ears are well aerated. Visualized bones are normal. Extracranial soft tissues are unremarkable. Probable sebaceous cyst in the left side of the scalp on image 12, unchanged. No subdural, epidural, or subarachnoid hemorrhage is identified. The cerebellum, brainstem, and basal cisterns are normal. No acute cortical ischemia or infarct. No mass, mass effect, or midline shift. Ventricles and sulci are unremarkable for age. CT CERVICAL SPINE FINDINGS Evaluation of the cervical spine is somewhat limited due to mild motion. Images are also noisey and limited below the level of C5. There is reversal of normal lordosis centered at C5-6, unchanged. 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 No acute fractures are seen. Degenerative changes seen most prominent CT C5-6. A posterior disc osteophyte complex at C5-6 narrows the canal to 9 mm, unchanged in the interval. No other soft tissue abnormalities. IMPRESSION: 1. No acute intracranial process. 2. Persistent and stable opacification of mastoid air cells without erosion or overlying soft tissue abnormality. 3. No fracture or malalignment seen in the cervical spine. Images are somewhat limited below the level of C5 as described above. Electronically Signed   By: Gerome Sam III M.D   On: 01/04/2016 23:16     Labs:   Basic Metabolic Panel:  Recent Labs Lab 01/03/16 1724 01/04/16 1732  NA 138 136  K 3.5 4.0   CL 99* 99*  CO2 29 27  GLUCOSE 125* 173*  BUN 7 13  CREATININE 0.93 0.99  CALCIUM 9.2 9.5   GFR Estimated Creatinine Clearance: 97.8 mL/min (by C-G formula based on Cr of 0.99). Liver Function Tests:  Recent Labs Lab 01/03/16 1724  AST 14*  ALT 13*  ALKPHOS 57  BILITOT 0.3  PROT 6.7  ALBUMIN 2.7*   No results for input(s): LIPASE, AMYLASE in the last 168 hours. No results for input(s): AMMONIA in the last 168 hours. Coagulation profile  Recent Labs Lab 01/04/16 2359  INR 0.99    CBC:  Recent Labs Lab 01/03/16 1724 01/04/16 1732  WBC 10.9* 21.6*  NEUTROABS 7.6  --   HGB 10.4* 10.5*  HCT 34.4* 34.8*  MCV 83.3 84.5  PLT 506* 590*   Cardiac Enzymes:  Recent Labs Lab 01/04/16 2359 01/05/16 1106  TROPONINI 0.03 0.05*   BNP: Invalid input(s): POCBNP CBG:  Recent Labs Lab 01/05/16 0755 01/05/16 1122  GLUCAP 232* 104*   D-Dimer No results for input(s): DDIMER in the last 72 hours. Hgb A1c No results for input(s): HGBA1C in the last 72 hours. Lipid Profile No results for input(s): CHOL, HDL, LDLCALC, TRIG, CHOLHDL, LDLDIRECT in the last 72 hours. Thyroid function studies  Recent Labs  01/04/16 2359  TSH 0.294*   Anemia work up No results  for input(s): VITAMINB12, FOLATE, FERRITIN, TIBC, IRON, RETICCTPCT in the last 72 hours. Microbiology Recent Results (from the past 240 hour(s))  MRSA PCR Screening     Status: None   Collection Time: 01/04/16 11:54 PM  Result Value Ref Range Status   MRSA by PCR NEGATIVE NEGATIVE Final    Comment:        The GeneXpert MRSA Assay (FDA approved for NASAL specimens only), is one component of a comprehensive MRSA colonization surveillance program. It is not intended to diagnose MRSA infection nor to guide or monitor treatment for MRSA infections.      Discharge Instructions:      Medication List    ASK your doctor about these medications        acetaminophen 500 MG tablet  Commonly known  as:  TYLENOL  Take 1,000 mg by mouth every 6 (six) hours as needed for moderate pain.     albuterol (2.5 MG/3ML) 0.083% nebulizer solution  Commonly known as:  PROVENTIL  Take 2.5 mg by nebulization every 6 (six) hours as needed for wheezing or shortness of breath. Reported on 10/31/2015     VENTOLIN HFA 108 (90 Base) MCG/ACT inhaler  Generic drug:  albuterol  Inhale 2 puffs into the lungs every 6 (six) hours as needed for wheezing or shortness of breath.     aspirin EC 81 MG tablet  Take 81 mg by mouth daily.     atorvastatin 20 MG tablet  Commonly known as:  LIPITOR  Take 20 mg by mouth daily at 6 PM.     BREO ELLIPTA 100-25 MCG/INH Aepb  Generic drug:  fluticasone furoate-vilanterol  Inhale 1 puff into the lungs daily as needed (for shortness of breath).     cephALEXin 500 MG capsule  Commonly known as:  KEFLEX  Take 1 capsule (500 mg total) by mouth 4 (four) times daily.     DELSYM 30 MG/5ML liquid  Generic drug:  dextromethorphan  Take 60 mg by mouth daily as needed for cough.     docusate sodium 100 MG capsule  Commonly known as:  COLACE  Take 300 mg by mouth at bedtime.     escitalopram 10 MG tablet  Commonly known as:  LEXAPRO  Take 10 mg by mouth daily.     ferrous sulfate 325 (65 FE) MG EC tablet  Take 325 mg by mouth daily.     gentian violet 2 % topical solution  Apply 0.5-1 mLs topically every 3 (three) days.     glipiZIDE 10 MG tablet  Commonly known as:  GLUCOTROL  Take 10 mg by mouth daily before breakfast.     hydrOXYzine 50 MG capsule  Commonly known as:  VISTARIL  Take 100 mg by mouth at bedtime.     ibuprofen 200 MG tablet  Commonly known as:  ADVIL,MOTRIN  Take 600 mg by mouth every 6 (six) hours as needed. For pain.     KOMBIGLYZE XR 2.12-998 MG Tb24  Generic drug:  Saxagliptin-Metformin  Take 1 tablet by mouth 2 (two) times daily.     lidocaine 5 %  Commonly known as:  LIDODERM  Place 2 patches onto the skin daily as needed (for  pain). Remove & Discard patch within 12 hours or as directed by MD     losartan-hydrochlorothiazide 100-12.5 MG tablet  Commonly known as:  HYZAAR  Take 1 tablet by mouth daily.     MAGNESIUM PO  Take 1 tablet by mouth daily.  Menthol (Topical Analgesic) 5 % Pads  Apply 1 patch topically at bedtime as needed (for leg pain).     MUSCLE RUB EX  Use as directed     NITROSTAT 0.4 MG SL tablet  Generic drug:  nitroGLYCERIN  Place 0.4 mg under the tongue every 5 (five) minutes as needed for chest pain.     omeprazole 20 MG capsule  Commonly known as:  PRILOSEC  Take 20 mg by mouth daily.     pioglitazone 30 MG tablet  Commonly known as:  ACTOS  Take 30 mg by mouth daily.     predniSONE 50 MG tablet  Commonly known as:  DELTASONE  Take 1 tablet daily     sulfamethoxazole-trimethoprim 200-40 MG/5ML suspension  Commonly known as:  BACTRIM,SEPTRA  Take 20 mLs (160 mg of trimethoprim total) by mouth 2 (two) times daily.     traMADol 50 MG tablet  Commonly known as:  ULTRAM  Take 50 mg by mouth every 6 (six) hours as needed. For pain     triamcinolone cream 0.1 %  Commonly known as:  KENALOG  Apply 1 application topically daily as needed.     vitamin B-12 1000 MCG tablet  Commonly known as:  CYANOCOBALAMIN  Take 1,000 mcg by mouth daily.          Time coordinating discharge: 35 min  Signed:  Jennavie Martinek U Sylvester Salonga   [facility] 01/05/2016, 1:36 PM

## 2016-01-05 NOTE — Care Management Note (Signed)
Case Management Note  Patient Details  Name: Alejandro Jones MRN: [id number] Date of Birth: 01/23/1956  Subjective/Objective:     Patient from home with wife, presents with sirs, left AMA.               Action/Plan:   Expected Discharge Date:                  Expected Discharge Plan:  Home/Self Care  In-House Referral:     Discharge planning Services  CM Consult  Post Acute Care Choice:    Choice offered to:     DME Arranged:    DME Agency:     HH Arranged:    HH Agency:     Status of Service:  Completed, signed off  Medicare Important Message Given:    Date Medicare IM Given:    Medicare IM give by:    Date Additional Medicare IM Given:    Additional Medicare Important Message give by:     If discussed at Long Length of Stay Meetings, dates discussed:    Additional Comments:  Leone Havenaylor, Karalynn Cottone [place], RN 01/05/2016, 6:34 PM

## 2016-01-05 NOTE — Progress Notes (Signed)
Patient requested to leave AMA, physician aware.  IV removed.  Security contacted due to patient screaming obscenities at his family members.  AMA paperwork signed and patient left the unit with his wife.

## 2016-01-05 NOTE — Progress Notes (Signed)
Dr. Georgia DomHarduk was paged with completion of 3LNS bolus and vital signs 1 hour upon finished. [facility] Osmany [employer]

## 2016-01-05 NOTE — Progress Notes (Signed)
VASCULAR LAB PRELIMINARY  PRELIMINARY  PRELIMINARY  PRELIMINARY  Bilateral lower extremity venous duplex completed.     Bilateral:  No evidence of DVT, superficial thrombosis, or Baker's Cyst.  Gave Patricia,RN the results.  Jenetta Logesami Marshal Eskew, RVT, RDMS 01/05/2016, 12:53 PM

## 2016-01-05 NOTE — Progress Notes (Signed)
Patient refuses any further breathing treatments and refuses labs to be drawn this am. Corena PilgrimHilda Xavion Muscat RN

## 2016-01-05 NOTE — Progress Notes (Signed)
Patient refuses to have bed alarm on. States he has fallen several times, does not know why. Was educated on the fall prevention, safety plan and still refuses to have bed alarm turned on. States his son will help when he needs to use the bathroom. Patient was demanding to go out and smoke, states when he gets ready he will take everything off and go. Dr. Erin HearingHarduck notified and responded that if patient goes to smoke he will have to sign out AMA. Patient was made aware and educated. States he will stay till morning and then he is leaving. Patient has multiple wounds to his neck, back, top of head, right arm and right hip. Dressings are dry and intact. Patient refuses to take his clothes off or allow dressings to be removed to have skin assessment and wounds assessed.He was made aware that he would have a wound consult tomorrow and he states he will only allow that to take place when his wife is here. He did allow nurse to check feet and no open areas noted.

## 2016-01-05 NOTE — Progress Notes (Signed)
Patient refuses a complete assessment of wounds at this time.  Explained importance of a thorough skin assessment pt. Verbalized understanding and continues to refuse.  Will continue to monitor.

## 2016-01-09 DIAGNOSIS — I5022 Chronic systolic (congestive) heart failure: Secondary | ICD-10-CM | POA: Insufficient documentation

## 2016-01-09 LAB — CULTURE, BLOOD (ROUTINE X 2)
Culture: NO GROWTH
Culture: NO GROWTH

## 2016-01-30 ENCOUNTER — Emergency Department (HOSPITAL_BASED_OUTPATIENT_CLINIC_OR_DEPARTMENT_OTHER)
Admission: EM | Admit: 2016-01-30 | Discharge: 2016-01-30 | Disposition: A | Discharge status: Home / Self Care | Payer: Medicare HMO | Attending: Emergency Medicine | Admitting: Emergency Medicine

## 2016-01-30 ENCOUNTER — Encounter (HOSPITAL_BASED_OUTPATIENT_CLINIC_OR_DEPARTMENT_OTHER): Payer: Self-pay | Admitting: *Deleted

## 2016-01-30 DIAGNOSIS — L0211 Cutaneous abscess of neck: Secondary | ICD-10-CM | POA: Diagnosis not present

## 2016-01-30 DIAGNOSIS — E119 Type 2 diabetes mellitus without complications: Secondary | ICD-10-CM | POA: Insufficient documentation

## 2016-01-30 DIAGNOSIS — I10 Essential (primary) hypertension: Secondary | ICD-10-CM | POA: Insufficient documentation

## 2016-01-30 DIAGNOSIS — Z7982 Long term (current) use of aspirin: Secondary | ICD-10-CM | POA: Insufficient documentation

## 2016-01-30 DIAGNOSIS — F17210 Nicotine dependence, cigarettes, uncomplicated: Secondary | ICD-10-CM | POA: Diagnosis not present

## 2016-01-30 DIAGNOSIS — J45909 Unspecified asthma, uncomplicated: Secondary | ICD-10-CM | POA: Diagnosis not present

## 2016-01-30 DIAGNOSIS — J449 Chronic obstructive pulmonary disease, unspecified: Secondary | ICD-10-CM | POA: Diagnosis not present

## 2016-01-30 DIAGNOSIS — Z79899 Other long term (current) drug therapy: Secondary | ICD-10-CM | POA: Diagnosis not present

## 2016-01-30 DIAGNOSIS — L0291 Cutaneous abscess, unspecified: Secondary | ICD-10-CM

## 2016-01-30 DIAGNOSIS — Z7984 Long term (current) use of oral hypoglycemic drugs: Secondary | ICD-10-CM | POA: Insufficient documentation

## 2016-01-30 DIAGNOSIS — I739 Peripheral vascular disease, unspecified: Secondary | ICD-10-CM | POA: Insufficient documentation

## 2016-01-30 DIAGNOSIS — L03221 Cellulitis of neck: Secondary | ICD-10-CM | POA: Diagnosis present

## 2016-01-30 MED ORDER — OXYCODONE HCL 5 MG PO TABS
5.0000 mg | ORAL_TABLET | Freq: Once | ORAL | Status: AC
  Administered 2016-01-30: 5 mg via ORAL
  Filled 2016-01-30: qty 1

## 2016-01-30 MED ORDER — DEXAMETHASONE 6 MG PO TABS
10.0000 mg | ORAL_TABLET | Freq: Once | ORAL | Status: AC
  Administered 2016-01-30: 10 mg via ORAL
  Filled 2016-01-30: qty 1

## 2016-01-30 MED ORDER — ACETAMINOPHEN 500 MG PO TABS
1000.0000 mg | ORAL_TABLET | Freq: Once | ORAL | Status: AC
  Administered 2016-01-30: 1000 mg via ORAL
  Filled 2016-01-30: qty 2

## 2016-01-30 MED ORDER — LIDOCAINE-EPINEPHRINE 1 %-1:100000 IJ SOLN
20.0000 mL | Freq: Once | INTRAMUSCULAR | Status: AC
  Administered 2016-01-30: 20 mL via INTRADERMAL
  Filled 2016-01-30: qty 1

## 2016-01-30 MED ORDER — IBUPROFEN 800 MG PO TABS
800.0000 mg | ORAL_TABLET | Freq: Once | ORAL | Status: AC
  Administered 2016-01-30: 800 mg via ORAL
  Filled 2016-01-30: qty 1

## 2016-01-30 MED ORDER — CLINDAMYCIN HCL 300 MG PO CAPS: 300.0000 mg | ORAL_CAPSULE | Freq: Four times a day (QID) | ORAL | Status: DC

## 2016-01-30 NOTE — ED Provider Notes (Signed)
CSN: [id number]     Arrival date & time 01/30/16  1744 History   By signing my name below, I, [facility], attest that this documentation has been prepared under the direction and in the presence of Melene Plan, DO. Electronically Signed: Randell Patient, ED Scribe. 01/30/2016. 8:10 PM.      Chief Complaint  Patient presents with  . Cellulitis   The history is provided by the patient and the spouse. No language interpreter was used.   HPI Comments: Alejandro Jones is a 60 y.o. male with a hx of HTN, DM, GERD, asthma, COPD, and peripheral vascular disease who presents to the Emergency Department complaining of constant, pruritic, moderately painful wound that spans his posterior neck, ongoing for the past six months and worse in the past week. Wife reports that the pt has had allergic reactions to many different foods with associated rashes, generalized pruritis to his entire body and that the pt scratching himself all over for the past 6 months but that over the past week the wound on the back of his neck has gradually increased in size and pain severity. She reports right-sided facial swelling along the posterior jaw line, intermittent fevers and chills, and generalized body aches. He has tried taking several different medications including Lyrica without relief and notes that he was instructed by his physician to cease use of this medication last night secondary to dizziness and weakness. Per wife, pt has been evaluated numerous times for this complaint by dermatologists, wound care specialists, his PCP, and by a pain specialist who have been unable to determine the cause of his symptoms. She notes that he was hospitalized earlier this month for CHF. Denies any other symptoms currently.  Past Medical History  Diagnosis Date  . Hypertension   . Diabetes mellitus   . Acid reflux   . Asthma   . Constipation   . Peripheral vascular disease (HCC)   . COPD (chronic obstructive pulmonary  disease) [facility])    Past Surgical History  Procedure Laterality Date  . Ankle surgery    . Iliac artery stent    . Abdominal aortagram N/A 05/10/2013    Procedure: ABDOMINAL Ronny Flurry;  Surgeon: Chuck Hint, MD;  Location: [facility] CATH LAB;  Service: Cardiovascular;  Laterality: N/A;  . Lower extremity angiogram Bilateral 05/10/2013    Procedure: LOWER EXTREMITY ANGIOGRAM;  Surgeon: Chuck Hint, MD;  Location: [facility] CATH LAB;  Service: Cardiovascular;  Laterality: Bilateral;   Family History  Problem Relation Age of Onset  . Diabetes Mother   . Diabetes Sister   . Cancer Brother   . Diabetes Brother    Social History  Substance Use Topics  . Smoking status: Current Every Day Smoker -- 3.00 packs/day for 45 years    Types: Cigarettes  . Smokeless tobacco: Never Used  . Alcohol Use: No    Review of Systems  Constitutional: Negative for fever and chills.  HENT: Positive for facial swelling. Negative for congestion.   Eyes: Negative for discharge and visual disturbance.  Respiratory: Negative for shortness of breath.   Cardiovascular: Negative for chest pain and palpitations.  Gastrointestinal: Negative for vomiting, abdominal pain and diarrhea.  Musculoskeletal: Positive for myalgias (generalized). Negative for arthralgias.  Skin: Positive for color change and wound. Negative for rash.  Neurological: Negative for tremors, syncope and headaches.  Psychiatric/Behavioral: Negative for confusion and dysphoric mood.  All other systems reviewed and are negative.     Allergies  Chantix; Latex; Metformin and  related; Wheat bran; Bean pod extract; and Tape  Home Medications   Prior to Admission medications   Medication Sig Start Date End Date Taking? Authorizing Provider  furosemide (LASIX) 40 MG tablet Take 40 mg by mouth.   Yes Historical Provider, MD  acetaminophen (TYLENOL) 500 MG tablet Take 1,000 mg by mouth every 6 (six) hours as needed for moderate pain.     Historical Provider, MD  albuterol (PROVENTIL) (2.5 MG/3ML) 0.083% nebulizer solution Take 2.5 mg by nebulization every 6 (six) hours as needed for wheezing or shortness of breath. Reported on 10/31/2015    Historical Provider, MD  albuterol (VENTOLIN HFA) 108 (90 Base) MCG/ACT inhaler Inhale 2 puffs into the lungs every 6 (six) hours as needed for wheezing or shortness of breath.    Historical Provider, MD  aspirin EC 81 MG tablet Take 81 mg by mouth daily.      Historical Provider, MD  atorvastatin (LIPITOR) 20 MG tablet Take 20 mg by mouth daily at 6 PM.     Historical Provider, MD  cephALEXin (KEFLEX) 500 MG capsule Take 1 capsule (500 mg total) by mouth 4 (four) times daily. 01/03/16   Isa RankinAnn B Smith, MD  dextromethorphan (DELSYM) 30 MG/5ML liquid Take 60 mg by mouth daily as needed for cough.     Historical Provider, MD  docusate sodium (COLACE) 100 MG capsule Take 300 mg by mouth at bedtime.     Historical Provider, MD  escitalopram (LEXAPRO) 10 MG tablet Take 10 mg by mouth daily. 11/18/15   Historical Provider, MD  ferrous sulfate 325 (65 FE) MG EC tablet Take 325 mg by mouth daily.  09/26/15 01/04/16  Historical Provider, MD  Fluticasone Furoate-Vilanterol (BREO ELLIPTA) 100-25 MCG/INH AEPB Inhale 1 puff into the lungs daily as needed (for shortness of breath).    Historical Provider, MD  gentian violet 2 % topical solution Apply 0.5-1 mLs topically every 3 (three) days.  12/12/15   Historical Provider, MD  glipiZIDE (GLUCOTROL) 10 MG tablet Take 10 mg by mouth daily before breakfast.    Historical Provider, MD  hydrOXYzine (VISTARIL) 50 MG capsule Take 100 mg by mouth at bedtime. 12/20/15 03/19/16  Historical Provider, MD  ibuprofen (ADVIL,MOTRIN) 200 MG tablet Take 600 mg by mouth every 6 (six) hours as needed. For pain.    Historical Provider, MD  lidocaine (LIDODERM) 5 % Place 2 patches onto the skin daily as needed (for pain). Remove & Discard patch within 12 hours or as directed by MD    Historical  Provider, MD  losartan-hydrochlorothiazide (HYZAAR) 100-12.5 MG per tablet Take 1 tablet by mouth daily.    Historical Provider, MD  MAGNESIUM PO Take 1 tablet by mouth daily.    Historical Provider, MD  Menthol, Topical Analgesic, 5 % PADS Apply 1 patch topically at bedtime as needed (for leg pain).     Historical Provider, MD  Menthol-Methyl Salicylate (MUSCLE RUB EX) Use as directed 12/13/15   Historical Provider, MD  NITROSTAT 0.4 MG SL tablet Place 0.4 mg under the tongue every 5 (five) minutes as needed for chest pain.  05/08/15   Historical Provider, MD  omeprazole (PRILOSEC) 20 MG capsule Take 20 mg by mouth daily.    Historical Provider, MD  pioglitazone (ACTOS) 30 MG tablet Take 30 mg by mouth daily.    Historical Provider, MD  predniSONE (DELTASONE) 50 MG tablet Take 1 tablet daily 01/04/16   Isa RankinAnn B Smith, MD  Saxagliptin-Metformin (KOMBIGLYZE XR) 2.12-998 MG TB24  Take 1 tablet by mouth 2 (two) times daily.    Historical Provider, MD  traMADol (ULTRAM) 50 MG tablet Take 50 mg by mouth every 6 (six) hours as needed. For pain    Historical Provider, MD  triamcinolone cream (KENALOG) 0.1 % Apply 1 application topically daily as needed.  11/17/15   Historical Provider, MD  vitamin B-12 (CYANOCOBALAMIN) 1000 MCG tablet Take 1,000 mcg by mouth daily.    Historical Provider, MD   BP 130/86 mmHg  Pulse 103  Temp(Src) 98 F (36.7 C) (Oral)  Resp 16  Ht 6' (1.829 m)  Wt 220 lb 4.8 oz (99.927 kg)  BMI 29.87 kg/m2  SpO2 93% Physical Exam  Constitutional: He is oriented to person, place, and time. He appears well-developed and well-nourished.  HENT:  Head: Normocephalic and atraumatic.  Eyes: EOM are normal. Pupils are equal, round, and reactive to light.  Neck: Normal range of motion. Neck supple. No JVD present.  Cardiovascular: Normal rate and regular rhythm.  Exam reveals no gallop and no friction rub.   No murmur heard. Pulmonary/Chest: No respiratory distress. He has no wheezes.   Abdominal: He exhibits no distension. There is no rebound and no guarding.  Musculoskeletal: Normal range of motion.  Neurological: He is alert and oriented to person, place, and time.  Skin: Lesion and rash noted. There is erythema. No pallor.  Large eroded wound area to his posterior neck. Nodule lesion just beneath jaw on the right just posterior to the angle of the mandible. Hard and non-tender.  Psychiatric: He has a normal mood and affect. His behavior is normal.  Nursing note and vitals reviewed.   ED Course  .Marland Kitchen.Incision and Drainage Date/Time: 01/30/2016 11:50 PM Performed by: Adela LankFLOYD, Esti Demello Authorized by: Melene PlanFLOYD, Lavere Shinsky Consent: Verbal consent obtained. Risks and benefits: risks, benefits and alternatives were discussed Consent given by: patient Required items: required blood products, implants, devices, and special equipment available Patient identity confirmed: verbally with patient Time out: Immediately prior to procedure a "time out" was called to verify the correct patient, procedure, equipment, support staff and site/side marked as required. Type: abscess Body area: neck Location details: right posterior neck Anesthesia: local infiltration Local anesthetic: lidocaine 1% with epinephrine Anesthetic total: 5 ml Patient sedated: no Scalpel size: 11 Incision type: single straight Incision depth: fascia Complexity: complex Drainage: purulent Drainage amount: moderate Wound treatment: wound left open Packing material: Vaseline gauze Patient tolerance: Patient tolerated the procedure well with no immediate complications Comments: Wound was explored with hemostats and multiple pockets of loculations were broken up.     DIAGNOSTIC STUDIES: Oxygen Saturation is 93% on RA, adequate by my interpretation.    COORDINATION OF CARE: 6:34 PM Ultrasounded swollen area on right face. Will order Decadron, Tylenol, ibuprofen, oxycodone. Discussed treatment plan with pt at bedside and pt  agreed to plan.  7:55 PM Will return to perform I&D of right abscess.   Emergency Focused Ultrasound Exam Limited Ultrasound of Soft Tissue   Performed and interpreted by Dr. Adela LankFloyd Indication: evaluation for infection or foreign body Transverse and Sagittal views of R neck are obtained in real time for the purposes of evaluation of skin and underlying soft tissues.  Findings:  heterogeneous fluid collection, with hyperemia/edema of surrounding tissue Interpretation:  abscess, with cellulitis Images archived electronically.  CPT Codes:  Neck [id number]   MDM   Final diagnoses:  None    60 yo M With a chief complaint of chronic skin rash. Patient is also  complaining of a localized area to the right posterior neck. This consistent with an abscess. Visualized fluid collection on ultrasound. I&D at bedside. Patient to follow-up for his chronic rash with his dermatologist and pain management physicians.  I personally performed the services described in this documentation, which was scribed in my presence. The recorded information has been reviewed and is accurate.   11:51 PM:  I have discussed the diagnosis/risks/treatment options with the patient and family and believe the pt to be eligible for discharge home to follow-up with PCP. We also discussed returning to the ED immediately if new or worsening sx occur. We discussed the sx which are most concerning (e.g., sudden worsening pain, fever, inability to tolerate by mouth) that necessitate immediate return. Medications administered to the patient during their visit and any new prescriptions provided to the patient are listed below.  Medications given during this visit Medications  dexamethasone (DECADRON) tablet 10 mg (10 mg Oral Given 01/30/16 1845)  acetaminophen (TYLENOL) tablet 1,000 mg (1,000 mg Oral Given 01/30/16 1844)  ibuprofen (ADVIL,MOTRIN) tablet 800 mg (800 mg Oral Given 01/30/16 1845)  oxyCODONE (Oxy IR/ROXICODONE) immediate  release tablet 5 mg (5 mg Oral Given 01/30/16 1845)  lidocaine-EPINEPHrine (XYLOCAINE W/EPI) 1 %-1:100000 (with pres) injection 20 mL (20 mLs Intradermal Given 01/30/16 1907)    Discharge Medication List as of 01/30/2016  8:17 PM    START taking these medications   Details  clindamycin (CLEOCIN) 300 MG capsule Take 1 capsule (300 mg total) by mouth 4 (four) times daily. X 7 days, Starting 01/30/2016, Until Discontinued, Print        The patient appears reasonably screen and/or stabilized for discharge and I doubt any other medical condition or other [facility] requiring further screening, evaluation, or treatment in the ED at this time prior to discharge.     Melene Plan, DO 01/30/16 2352

## 2016-01-30 NOTE — ED Notes (Signed)
Pt has raw irritated area to back of neck, pt also has infected swollen area to right side of neck.

## 2016-01-30 NOTE — Discharge Instructions (Signed)
Have someone look at this in two days.  Follow up with your pain mgmt doctors and dermatologist.   Incision and Drainage Incision and drainage is a procedure in which a sac-like structure (cystic structure) is opened and drained. The area to be drained usually contains material such as pus, fluid, or blood.  LET YOUR CAREGIVER KNOW ABOUT:   Allergies to medicine.  Medicines taken, including vitamins, herbs, eyedrops, over-the-counter medicines, and creams.  Use of steroids (by mouth or creams).  Previous problems with anesthetics or numbing medicines.  History of bleeding problems or blood clots.  Previous surgery.  Other health problems, including diabetes and kidney problems.  Possibility of pregnancy, if this applies. RISKS AND COMPLICATIONS  Pain.  Bleeding.  Scarring.  Infection. BEFORE THE PROCEDURE  You may need to have an ultrasound or other imaging tests to see how large or deep your cystic structure is. Blood tests may also be used to determine if you have an infection or how severe the infection is. You may need to have a tetanus shot. PROCEDURE  The affected area is cleaned with a cleaning fluid. The cyst area will then be numbed with a medicine (local anesthetic). A small incision will be made in the cystic structure. A syringe or catheter may be used to drain the contents of the cystic structure, or the contents may be squeezed out. The area will then be flushed with a cleansing solution. After cleansing the area, it is often gently packed with a gauze or another wound dressing. Once it is packed, it will be covered with gauze and tape or some other type of wound dressing. AFTER THE PROCEDURE   Often, you will be allowed to go home right after the procedure.  You may be given antibiotic medicine to prevent or heal an infection.  If the area was packed with gauze or some other wound dressing, you will likely need to come back in 1 to 2 days to get it  removed.  The area should heal in about 14 days.   This information is not intended to replace advice given to you by your health care provider. Make sure you discuss any questions you have with your health care provider.   Document Released: 01/15/2001 Document Revised: 01/21/2012 Document Reviewed: 09/16/2011 Elsevier Interactive Patient Education [employer]2016 [employer] Inc.

## 2016-02-08 ENCOUNTER — Ambulatory Visit: Payer: Self-pay | Admitting: Podiatry

## 2016-02-13 DIAGNOSIS — M25422 Effusion, left elbow: Secondary | ICD-10-CM | POA: Insufficient documentation

## 2016-02-14 ENCOUNTER — Ambulatory Visit (INDEPENDENT_AMBULATORY_CARE_PROVIDER_SITE_OTHER): Payer: Medicare HMO | Admitting: Podiatry

## 2016-02-14 ENCOUNTER — Ambulatory Visit: Payer: Self-pay | Admitting: Podiatry

## 2016-02-14 ENCOUNTER — Encounter: Payer: Self-pay | Admitting: Podiatry

## 2016-02-14 DIAGNOSIS — M79676 Pain in unspecified toe(s): Secondary | ICD-10-CM

## 2016-02-14 DIAGNOSIS — M79674 Pain in right toe(s): Secondary | ICD-10-CM

## 2016-02-14 DIAGNOSIS — B351 Tinea unguium: Secondary | ICD-10-CM | POA: Diagnosis not present

## 2016-02-14 DIAGNOSIS — E1151 Type 2 diabetes mellitus with diabetic peripheral angiopathy without gangrene: Secondary | ICD-10-CM | POA: Diagnosis not present

## 2016-02-14 NOTE — Progress Notes (Addendum)
Patient ID: Alejandro Jones, male   DOB: 04/03/1956, 60 y.o.   MRN: [id number] HPI  Complaint:  Visit Type: Patient returns to my office for continued preventative foot care services. Complaint: Patient states" my nails have grown long and thick and become painful to walk and wear shoes" Patient has been diagnosed with DM with vascular and neuropathy. Marland Kitchen. He presents for preventative foot care services. No changes to ROS  Podiatric Exam: Vascular: dorsalis pedis and posterior tibial pulses are negative. Capillary return is immediate. Temperature gradient is negative. Skin turgor WNL,   Sensorium: Absent Semmes Weinstein monofilament test.   Nail Exam: Pt has thick disfigured discolored nails with subungual debris noted bilateral entire nail hallux through fifth toenails Ulcer Exam: There is no evidence of ulcer or pre-ulcerative changes or infection. Orthopedic Exam: Muscle tone and strength are WNL. No limitations in general ROM. No crepitus or effusions noted. Foot type and digits show no abnormalities. Bony prominences are unremarkable. Skin: No Porokeratosis.  Healing ulcer right foot base fifth metatarsal.  This is under treatment by dermatologist..  Healing skin lesion left ankle lateral aspect.  Diagnosis:  Tinea unguium, Pain in right toe, pain in left toes  Treatment & Plan Procedures and Treatment: Consent by patient was obtained for treatment procedures. The patient understood the discussion of treatment and procedures well. All questions were answered thoroughly reviewed. Debridement of mycotic and hypertrophic toenails, 1 through 5 bilateral and clearing of subungual debris. No ulceration, no infection noted. DPN with neuropathy, angiopathy and ulcer. Initiate diabetic shoe paperwork. Return Visit-Office Procedure: Patient instructed to return to the office for a follow up visit  4months for continued evaluation and treatment.   Helane GuntherGregory Madysen Faircloth DPM

## 2016-02-16 ENCOUNTER — Emergency Department (HOSPITAL_BASED_OUTPATIENT_CLINIC_OR_DEPARTMENT_OTHER)
Admission: EM | Admit: 2016-02-16 | Discharge: 2016-02-16 | Disposition: A | Discharge status: Home / Self Care | Payer: Medicare HMO | Attending: Emergency Medicine | Admitting: Emergency Medicine

## 2016-02-16 ENCOUNTER — Encounter (HOSPITAL_BASED_OUTPATIENT_CLINIC_OR_DEPARTMENT_OTHER): Payer: Self-pay | Admitting: *Deleted

## 2016-02-16 DIAGNOSIS — Y939 Activity, unspecified: Secondary | ICD-10-CM | POA: Diagnosis not present

## 2016-02-16 DIAGNOSIS — X30XXXA Exposure to excessive natural heat, initial encounter: Secondary | ICD-10-CM | POA: Diagnosis not present

## 2016-02-16 DIAGNOSIS — J449 Chronic obstructive pulmonary disease, unspecified: Secondary | ICD-10-CM | POA: Insufficient documentation

## 2016-02-16 DIAGNOSIS — Y999 Unspecified external cause status: Secondary | ICD-10-CM | POA: Insufficient documentation

## 2016-02-16 DIAGNOSIS — S40211A Abrasion of right shoulder, initial encounter: Secondary | ICD-10-CM | POA: Insufficient documentation

## 2016-02-16 DIAGNOSIS — F17210 Nicotine dependence, cigarettes, uncomplicated: Secondary | ICD-10-CM | POA: Insufficient documentation

## 2016-02-16 DIAGNOSIS — Y929 Unspecified place or not applicable: Secondary | ICD-10-CM | POA: Insufficient documentation

## 2016-02-16 DIAGNOSIS — Z7984 Long term (current) use of oral hypoglycemic drugs: Secondary | ICD-10-CM | POA: Diagnosis not present

## 2016-02-16 DIAGNOSIS — E119 Type 2 diabetes mellitus without complications: Secondary | ICD-10-CM | POA: Insufficient documentation

## 2016-02-16 DIAGNOSIS — I10 Essential (primary) hypertension: Secondary | ICD-10-CM | POA: Insufficient documentation

## 2016-02-16 DIAGNOSIS — S1091XA Abrasion of unspecified part of neck, initial encounter: Secondary | ICD-10-CM | POA: Diagnosis not present

## 2016-02-16 DIAGNOSIS — T2007XA Burn of unspecified degree of neck, initial encounter: Secondary | ICD-10-CM | POA: Diagnosis present

## 2016-02-16 DIAGNOSIS — Z79899 Other long term (current) drug therapy: Secondary | ICD-10-CM | POA: Diagnosis not present

## 2016-02-16 DIAGNOSIS — I739 Peripheral vascular disease, unspecified: Secondary | ICD-10-CM | POA: Diagnosis not present

## 2016-02-16 DIAGNOSIS — T07XXXA Unspecified multiple injuries, initial encounter: Secondary | ICD-10-CM

## 2016-02-16 DIAGNOSIS — L299 Pruritus, unspecified: Secondary | ICD-10-CM

## 2016-02-16 DIAGNOSIS — Z7982 Long term (current) use of aspirin: Secondary | ICD-10-CM | POA: Insufficient documentation

## 2016-02-16 MED ORDER — HYDROMORPHONE HCL 1 MG/ML IJ SOLN
1.0000 mg | Freq: Once | INTRAMUSCULAR | Status: AC
  Administered 2016-02-16: 1 mg via INTRAVENOUS
  Filled 2016-02-16: qty 1

## 2016-02-16 MED ORDER — DIPHENHYDRAMINE HCL 50 MG/ML IJ SOLN
25.0000 mg | Freq: Once | INTRAMUSCULAR | Status: AC
  Administered 2016-02-16: 25 mg via INTRAVENOUS
  Filled 2016-02-16: qty 1

## 2016-02-16 MED ORDER — PREDNISONE 10 MG PO TABS: 40.0000 mg | ORAL_TABLET | Freq: Every day | ORAL | Status: DC

## 2016-02-16 MED ORDER — FAMOTIDINE IN NACL 20-0.9 MG/50ML-% IV SOLN
20.0000 mg | Freq: Once | INTRAVENOUS | Status: AC
  Administered 2016-02-16: 20 mg via INTRAVENOUS
  Filled 2016-02-16: qty 50

## 2016-02-16 MED ORDER — SODIUM CHLORIDE 0.9 % IV BOLUS (SEPSIS)
250.0000 mL | Freq: Once | INTRAVENOUS | Status: AC
  Administered 2016-02-16: 250 mL via INTRAVENOUS

## 2016-02-16 MED ORDER — METHYLPREDNISOLONE SODIUM SUCC 125 MG IJ SOLR
125.0000 mg | Freq: Once | INTRAMUSCULAR | Status: AC
  Administered 2016-02-16: 125 mg via INTRAVENOUS
  Filled 2016-02-16: qty 2

## 2016-02-16 MED ORDER — SODIUM CHLORIDE 0.9 % IV SOLN: INTRAVENOUS | Status: DC

## 2016-02-16 MED ORDER — HYDROMORPHONE HCL 4 MG PO TABS: 4.0000 mg | ORAL_TABLET | Freq: Four times a day (QID) | ORAL | Status: DC | PRN

## 2016-02-16 MED FILL — HYDROmorphone HCL 4 MG TABS: 4 | 5 days supply | Qty: 20 | Fill #0

## 2016-02-16 MED FILL — predniSONE 10 MG TABS: 10 | 5 days supply | Qty: 20 | Fill #0

## 2016-02-16 NOTE — Discharge Instructions (Signed)
Apply the cream that the dermatologist gave you to the open wounds. Make an appointment to follow-up with your regular doctor. Take prednisone as directed for the next 5 days. Take the hydromorphone as needed for burning.

## 2016-02-16 NOTE — ED Notes (Signed)
Skin allergies that cause him to scratch his skin until it comes off. This has been going on for a year.

## 2016-02-16 NOTE — ED Provider Notes (Signed)
CSN: [id number]     Arrival date & time 02/16/16  1433 History   First MD Initiated Contact with Patient 02/16/16 1516     Chief Complaint  Patient presents with  . Abrasion     (Consider location/radiation/quality/duration/timing/severity/associated sxs/prior Treatment) The history is provided by the patient.  60 year old male with history of hypertension diabetes asthma and COPD peripheral vascular disease presents with a chronic itching syndrome that has not been well delineated. Patient chronically scratches areas because they burn. And because they itch. Patient with large open wounds at times throughout his body summer healed currently large area of open wound to the back of his neck due to scratching as well as his right shoulder and right scapular area. Patient seen multiple dermatologists and specialist for this to include allergist without any resolution. Patient currently using a cream that dermatology provided. Patient is allergic to silver so Silvadene cream is not helpful.  Past Medical History  Diagnosis Date  . Hypertension   . Diabetes mellitus   . Acid reflux   . Asthma   . Constipation   . Peripheral vascular disease (HCC)   . COPD (chronic obstructive pulmonary disease) [facility])    Past Surgical History  Procedure Laterality Date  . Ankle surgery    . Iliac artery stent    . Abdominal aortagram N/A 05/10/2013    Procedure: ABDOMINAL Ronny Flurry;  Surgeon: Chuck Hint, MD;  Location: [facility] CATH LAB;  Service: Cardiovascular;  Laterality: N/A;  . Lower extremity angiogram Bilateral 05/10/2013    Procedure: LOWER EXTREMITY ANGIOGRAM;  Surgeon: Chuck Hint, MD;  Location: [facility] CATH LAB;  Service: Cardiovascular;  Laterality: Bilateral;   Family History  Problem Relation Age of Onset  . Diabetes Mother   . Diabetes Sister   . Cancer Brother   . Diabetes Brother    Social History  Substance Use Topics  . Smoking status: Current Every Day Smoker -- 3.00  packs/day for 45 years    Types: Cigarettes  . Smokeless tobacco: Never Used  . Alcohol Use: No    Review of Systems  Constitutional: Negative for fever.  HENT: Negative for congestion.   Eyes: Negative for visual disturbance.  Respiratory: Negative for shortness of breath.   Cardiovascular: Negative for chest pain.  Gastrointestinal: Negative for abdominal pain.  Genitourinary: Negative for dysuria.  Musculoskeletal: Positive for back pain.  Skin: Positive for rash and wound.  Neurological: Negative for headaches.  Hematological: Does not bruise/bleed easily.  Psychiatric/Behavioral: Negative for confusion.      Allergies  Chantix; Latex; Metformin and related; Silver; Wheat bran; Bean pod extract; and Tape  Home Medications   Prior to Admission medications   Medication Sig Start Date End Date Taking? Authorizing Provider  acetaminophen (TYLENOL) 500 MG tablet Take 1,000 mg by mouth every 6 (six) hours as needed for moderate pain.    Historical Provider, MD  albuterol (PROVENTIL) (2.5 MG/3ML) 0.083% nebulizer solution Take 2.5 mg by nebulization every 6 (six) hours as needed for wheezing or shortness of breath. Reported on 10/31/2015    Historical Provider, MD  albuterol (VENTOLIN HFA) 108 (90 Base) MCG/ACT inhaler Inhale 2 puffs into the lungs every 6 (six) hours as needed for wheezing or shortness of breath.    Historical Provider, MD  aspirin EC 81 MG tablet Take 81 mg by mouth daily.      Historical Provider, MD  atorvastatin (LIPITOR) 20 MG tablet Take 20 mg by mouth daily at 6 PM.  Historical Provider, MD  cephALEXin (KEFLEX) 500 MG capsule Take 1 capsule (500 mg total) by mouth 4 (four) times daily. 01/03/16   Isa RankinAnn B Smith, MD  clindamycin (CLEOCIN) 300 MG capsule Take 1 capsule (300 mg total) by mouth 4 (four) times daily. X 7 days 01/30/16   Melene Planan Floyd, DO  dextromethorphan (DELSYM) 30 MG/5ML liquid Take 60 mg by mouth daily as needed for cough.     Historical Provider,  MD  docusate sodium (COLACE) 100 MG capsule Take 300 mg by mouth at bedtime.     Historical Provider, MD  escitalopram (LEXAPRO) 10 MG tablet Take 10 mg by mouth daily. 11/18/15   Historical Provider, MD  ferrous sulfate 325 (65 FE) MG EC tablet Take 325 mg by mouth daily.  09/26/15 01/04/16  Historical Provider, MD  Fluticasone Furoate-Vilanterol (BREO ELLIPTA) 100-25 MCG/INH AEPB Inhale 1 puff into the lungs daily as needed (for shortness of breath).    Historical Provider, MD  furosemide (LASIX) 40 MG tablet Take 40 mg by mouth.    Historical Provider, MD  gentian violet 2 % topical solution Apply 0.5-1 mLs topically every 3 (three) days.  12/12/15   Historical Provider, MD  glipiZIDE (GLUCOTROL) 10 MG tablet Take 10 mg by mouth daily before breakfast.    Historical Provider, MD  HYDROmorphone (DILAUDID) 4 MG tablet Take 1 tablet (4 mg total) by mouth every 6 (six) hours as needed for severe pain. 02/16/16   Vanetta MuldersScott Corwyn Vora, MD  hydrOXYzine (VISTARIL) 50 MG capsule Take 100 mg by mouth at bedtime. 12/20/15 03/19/16  Historical Provider, MD  ibuprofen (ADVIL,MOTRIN) 200 MG tablet Take 600 mg by mouth every 6 (six) hours as needed. For pain.    Historical Provider, MD  lidocaine (LIDODERM) 5 % Place 2 patches onto the skin daily as needed (for pain). Remove & Discard patch within 12 hours or as directed by MD    Historical Provider, MD  losartan-hydrochlorothiazide (HYZAAR) 100-12.5 MG per tablet Take 1 tablet by mouth daily.    Historical Provider, MD  MAGNESIUM PO Take 1 tablet by mouth daily.    Historical Provider, MD  Menthol, Topical Analgesic, 5 % PADS Apply 1 patch topically at bedtime as needed (for leg pain).     Historical Provider, MD  Menthol-Methyl Salicylate (MUSCLE RUB EX) Use as directed 12/13/15   Historical Provider, MD  NITROSTAT 0.4 MG SL tablet Place 0.4 mg under the tongue every 5 (five) minutes as needed for chest pain.  05/08/15   Historical Provider, MD  omeprazole (PRILOSEC) 20 MG  capsule Take 20 mg by mouth daily.    Historical Provider, MD  pioglitazone (ACTOS) 30 MG tablet Take 30 mg by mouth daily.    Historical Provider, MD  predniSONE (DELTASONE) 10 MG tablet Take 4 tablets (40 mg total) by mouth daily. 02/16/16   Vanetta MuldersScott Naser Schuld, MD  predniSONE (DELTASONE) 50 MG tablet Take 1 tablet daily 01/04/16   Isa RankinAnn B Smith, MD  Saxagliptin-Metformin (KOMBIGLYZE XR) 2.12-998 MG TB24 Take 1 tablet by mouth 2 (two) times daily.    Historical Provider, MD  traMADol (ULTRAM) 50 MG tablet Take 50 mg by mouth every 6 (six) hours as needed. For pain    Historical Provider, MD  triamcinolone cream (KENALOG) 0.1 % Apply 1 application topically daily as needed.  11/17/15   Historical Provider, MD  vitamin B-12 (CYANOCOBALAMIN) 1000 MCG tablet Take 1,000 mcg by mouth daily.    Historical Provider, MD   BP 112/78  mmHg  Pulse 110  Temp(Src) 98.5 F (36.9 C) (Oral)  Resp 20  Ht 6' (1.829 m)  Wt 99.791 kg  BMI 29.83 kg/m2  SpO2 95% Physical Exam  Constitutional: He is oriented to person, place, and time. He appears well-developed and well-nourished.  HENT:  Head: Normocephalic and atraumatic.  Mouth/Throat: Oropharynx is clear and moist.  Eyes: Conjunctivae and EOM are normal. Pupils are equal, round, and reactive to light.  Neck: Normal range of motion. Neck supple.  Cardiovascular: Normal rate, regular rhythm and normal heart sounds.   No murmur heard. Pulmonary/Chest: Effort normal and breath sounds normal. No respiratory distress.  Abdominal: Soft. Bowel sounds are normal. There is no tenderness.  Musculoskeletal: Normal range of motion.  Neurological: He is alert and oriented to person, place, and time. No cranial nerve deficit. He exhibits normal muscle tone. Coordination normal.  Skin: Skin is warm.  Large open area on the back of his neck due to chronic scratching. Probably measuring 20 x 15 cm. No secondary infection. Not through the dermis. Also other scattered areas on his  right shoulder and right scapular area. Areas of chronic other areas that have healed.  Nursing note and vitals reviewed.   ED Course  Procedures (including critical care time) Labs Review Labs Reviewed - No data to display  Imaging Review No results found. I have personally reviewed and evaluated these images and lab results as part of my medical decision-making.   EKG Interpretation None      MDM   Final diagnoses:  Itching with irritation  Abrasions of multiple sites    Patient with a history of a chronic itching problem. Been followed by dermatology as well as primary care doctor for this. Patient has intense itching that has not been well controlled. Dermatology is provided him with a cream to put on these wounds. Patient has large open areas of abrasions just from chronic scratching. No secondary infection and evident today. Patient treated here with Decadron and Pepcid Benadryl and some hydromorphone with some improvement. Will be continued on prednisone for 5 days as well as oral hydromorphone. Patient states that he has to itch these areas because they burn.  Discussed with his family member that resolution of this is not going to happen here since he seen so many specialists 4. Will give a trial of this type of control. Patient stable for discharge home.    Vanetta Mulders, MD 02/16/16 1737

## 2016-02-16 NOTE — ED Notes (Signed)
Patient is very anxious. The patient is itching his skin and restless. The family member at the bedside wanting us to do something to stop the itching and "knock him out"

## 2016-02-21 DIAGNOSIS — L299 Pruritus, unspecified: Secondary | ICD-10-CM | POA: Insufficient documentation

## 2016-02-21 DIAGNOSIS — B370 Candidal stomatitis: Secondary | ICD-10-CM | POA: Insufficient documentation

## 2016-03-13 DIAGNOSIS — Z91199 Patient's noncompliance with other medical treatment and regimen due to unspecified reason: Secondary | ICD-10-CM | POA: Insufficient documentation

## 2016-03-13 DIAGNOSIS — Z9119 Patient's noncompliance with other medical treatment and regimen: Secondary | ICD-10-CM | POA: Insufficient documentation

## 2016-03-21 ENCOUNTER — Emergency Department (HOSPITAL_BASED_OUTPATIENT_CLINIC_OR_DEPARTMENT_OTHER)
Admission: EM | Admit: 2016-03-21 | Discharge: 2016-03-21 | Disposition: A | Discharge status: Home / Self Care | Payer: Medicare HMO | Attending: Emergency Medicine | Admitting: Emergency Medicine

## 2016-03-21 ENCOUNTER — Encounter (HOSPITAL_BASED_OUTPATIENT_CLINIC_OR_DEPARTMENT_OTHER): Payer: Self-pay | Admitting: Emergency Medicine

## 2016-03-21 DIAGNOSIS — I10 Essential (primary) hypertension: Secondary | ICD-10-CM | POA: Insufficient documentation

## 2016-03-21 DIAGNOSIS — J441 Chronic obstructive pulmonary disease with (acute) exacerbation: Secondary | ICD-10-CM | POA: Diagnosis not present

## 2016-03-21 DIAGNOSIS — J45909 Unspecified asthma, uncomplicated: Secondary | ICD-10-CM | POA: Diagnosis not present

## 2016-03-21 DIAGNOSIS — Z7984 Long term (current) use of oral hypoglycemic drugs: Secondary | ICD-10-CM | POA: Insufficient documentation

## 2016-03-21 DIAGNOSIS — E119 Type 2 diabetes mellitus without complications: Secondary | ICD-10-CM | POA: Diagnosis not present

## 2016-03-21 DIAGNOSIS — Z91040 Latex allergy status: Secondary | ICD-10-CM | POA: Diagnosis not present

## 2016-03-21 DIAGNOSIS — M542 Cervicalgia: Secondary | ICD-10-CM | POA: Diagnosis present

## 2016-03-21 DIAGNOSIS — Z7982 Long term (current) use of aspirin: Secondary | ICD-10-CM | POA: Insufficient documentation

## 2016-03-21 DIAGNOSIS — F17210 Nicotine dependence, cigarettes, uncomplicated: Secondary | ICD-10-CM | POA: Insufficient documentation

## 2016-03-21 DIAGNOSIS — Z79899 Other long term (current) drug therapy: Secondary | ICD-10-CM | POA: Diagnosis not present

## 2016-03-21 DIAGNOSIS — R234 Changes in skin texture: Secondary | ICD-10-CM | POA: Diagnosis not present

## 2016-03-21 HISTORY — DX: Other chronic pain: G89.29

## 2016-03-21 HISTORY — DX: Cervicalgia: M54.2

## 2016-03-21 MED ORDER — HYDROMORPHONE HCL 1 MG/ML IJ SOLN
2.0000 mg | Freq: Once | INTRAMUSCULAR | Status: AC
  Administered 2016-03-21: 2 mg via INTRAMUSCULAR
  Filled 2016-03-21: qty 2

## 2016-03-21 MED ORDER — PREDNISONE 50 MG PO TABS: 50.0000 mg | ORAL_TABLET | Freq: Every day | ORAL | 0 refills | Status: DC

## 2016-03-21 MED ORDER — PREDNISONE 50 MG PO TABS
60.0000 mg | ORAL_TABLET | Freq: Once | ORAL | Status: AC
  Administered 2016-03-21: 60 mg via ORAL
  Filled 2016-03-21: qty 1

## 2016-03-21 MED ORDER — ONDANSETRON 8 MG PO TBDP
8.0000 mg | ORAL_TABLET | Freq: Once | ORAL | Status: AC
  Administered 2016-03-21: 8 mg via ORAL
  Filled 2016-03-21: qty 1

## 2016-03-21 NOTE — ED Notes (Signed)
Pt verbalizes understanding of d/c instructions and denies any further needs at this time. 

## 2016-03-21 NOTE — ED Triage Notes (Signed)
Pt c/o "severe pain " to posterior neck wound x 1 year

## 2016-03-21 NOTE — Discharge Instructions (Signed)
Continue the local treatment of your rash, follow up with all of your physicians.

## 2016-03-21 NOTE — ED Notes (Signed)
Pt is a pain clinic patient, asking for something to help with the burning and something to "knock me out like last time."

## 2016-03-21 NOTE — ED Provider Notes (Signed)
[facility] Provider Note   CSN: [id number] Arrival date & time: 03/21/16  0026     History   Chief Complaint Chief Complaint  Patient presents with  . Abscess    HPI Alejandro Jones is a 60 y.o. male.  The history is provided by the patient.  Abscess  He has a chronic skin condition in his neck and back which is exacerbated by a variety of foods. This has been under the care of a dermatologist and neurologists and pain management but continues to be a problem. He comes in tonight complaining of severe pain in his neck area. He states the pain is burning which is typical. He is taken acetaminophen without any relief. In the past, steroids give temporary relief.  Past Medical History:  Diagnosis Date  . Acid reflux   . Asthma   . Chronic neck pain   . Constipation   . COPD (chronic obstructive pulmonary disease) (HCC)   . Diabetes mellitus   . Hypertension   . Peripheral vascular disease [facility](HCC)     Patient Active Problem List   Diagnosis Date Noted  . SIRS (systemic inflammatory response syndrome) (HCC) 01/04/2016  . COPD exacerbation (HCC) 01/04/2016  . Type 2 diabetes mellitus with vascular disease (HCC) 01/04/2016  . Lower extremity edema 01/04/2016  . Essential hypertension 08/22/2015  . Hyperlipidemia 08/22/2015  . Tobacco abuse 08/22/2015  . Atherosclerosis of native arteries of extremity with intermittent claudication (HCC) 06/22/2014  . PVD (peripheral vascular disease) (HCC) 03/31/2013  . Pain in limb-Bilateral leg 01/27/2013  . Peripheral vascular disease, unspecified (HCC) 01/27/2013  . Ischemic toe 12/23/2012  . Atherosclerosis of native arteries of the extremities with intermittent claudication 10/07/2012    Past Surgical History:  Procedure Laterality Date  . ABDOMINAL AORTAGRAM N/A 05/10/2013   Procedure: ABDOMINAL Ronny FlurryAORTAGRAM;  Surgeon: Chuck Hinthristopher S Dickson, MD;  [facility];  Service: Cardiovascular;  Laterality: N/A;  .  ANKLE SURGERY    . ILIAC ARTERY STENT    . LOWER EXTREMITY ANGIOGRAM Bilateral 05/10/2013   Procedure: LOWER EXTREMITY ANGIOGRAM;  Surgeon: Chuck Hinthristopher S Dickson, MD;  [facility];  Service: Cardiovascular;  Laterality: Bilateral;       Home Medications    Prior to Admission medications   Medication Sig Start Date End Date Taking? Authorizing Provider  topiramate (TOPAMAX) 100 MG tablet Take 100 mg by mouth 2 (two) times daily.   Yes Historical Provider, MD  acetaminophen (TYLENOL) 500 MG tablet Take 1,000 mg by mouth every 6 (six) hours as needed for moderate pain.    Historical Provider, MD  albuterol (PROVENTIL) (2.5 MG/3ML) 0.083% nebulizer solution Take 2.5 mg by nebulization every 6 (six) hours as needed for wheezing or shortness of breath. Reported on 10/31/2015    Historical Provider, MD  albuterol (VENTOLIN HFA) 108 (90 Base) MCG/ACT inhaler Inhale 2 puffs into the lungs every 6 (six) hours as needed for wheezing or shortness of breath.    Historical Provider, MD  aspirin EC 81 MG tablet Take 81 mg by mouth daily.      Historical Provider, MD  atorvastatin (LIPITOR) 20 MG tablet Take 20 mg by mouth daily at 6 PM.     Historical Provider, MD  cephALEXin (KEFLEX) 500 MG capsule Take 1 capsule (500 mg total) by mouth 4 (four) times daily. 01/03/16   Isa RankinAnn B Smith, MD  clindamycin (CLEOCIN) 300 MG capsule Take 1 capsule (300 mg total) by mouth 4 (four) times daily.  X 7 days 01/30/16   Melene Plan, DO  dextromethorphan (DELSYM) 30 MG/5ML liquid Take 60 mg by mouth daily as needed for cough.     Historical Provider, MD  docusate sodium (COLACE) 100 MG capsule Take 300 mg by mouth at bedtime.     Historical Provider, MD  escitalopram (LEXAPRO) 10 MG tablet Take 10 mg by mouth daily. 11/18/15   Historical Provider, MD  ferrous sulfate 325 (65 FE) MG EC tablet Take 325 mg by mouth daily.  09/26/15 01/04/16  Historical Provider, MD  Fluticasone Furoate-Vilanterol (BREO ELLIPTA) 100-25 MCG/INH  AEPB Inhale 1 puff into the lungs daily as needed (for shortness of breath).    Historical Provider, MD  furosemide (LASIX) 40 MG tablet Take 40 mg by mouth.    Historical Provider, MD  gentian violet 2 % topical solution Apply 0.5-1 mLs topically every 3 (three) days.  12/12/15   Historical Provider, MD  glipiZIDE (GLUCOTROL) 10 MG tablet Take 10 mg by mouth daily before breakfast.    Historical Provider, MD  HYDROmorphone (DILAUDID) 4 MG tablet Take 1 tablet (4 mg total) by mouth every 6 (six) hours as needed for severe pain. 02/16/16   Vanetta Mulders, MD  ibuprofen (ADVIL,MOTRIN) 200 MG tablet Take 600 mg by mouth every 6 (six) hours as needed. For pain.    Historical Provider, MD  lidocaine (LIDODERM) 5 % Place 2 patches onto the skin daily as needed (for pain). Remove & Discard patch within 12 hours or as directed by MD    Historical Provider, MD  losartan-hydrochlorothiazide (HYZAAR) 100-12.5 MG per tablet Take 1 tablet by mouth daily.    Historical Provider, MD  MAGNESIUM PO Take 1 tablet by mouth daily.    Historical Provider, MD  Menthol, Topical Analgesic, 5 % PADS Apply 1 patch topically at bedtime as needed (for leg pain).     Historical Provider, MD  Menthol-Methyl Salicylate (MUSCLE RUB EX) Use as directed 12/13/15   Historical Provider, MD  NITROSTAT 0.4 MG SL tablet Place 0.4 mg under the tongue every 5 (five) minutes as needed for chest pain.  05/08/15   Historical Provider, MD  omeprazole (PRILOSEC) 20 MG capsule Take 20 mg by mouth daily.    Historical Provider, MD  pioglitazone (ACTOS) 30 MG tablet Take 30 mg by mouth daily.    Historical Provider, MD  predniSONE (DELTASONE) 10 MG tablet Take 4 tablets (40 mg total) by mouth daily. 02/16/16   Vanetta Mulders, MD  predniSONE (DELTASONE) 50 MG tablet Take 1 tablet daily 01/04/16   Isa Rankin, MD  Saxagliptin-Metformin (KOMBIGLYZE XR) 2.12-998 MG TB24 Take 1 tablet by mouth 2 (two) times daily.    Historical Provider, MD  traMADol  (ULTRAM) 50 MG tablet Take 50 mg by mouth every 6 (six) hours as needed. For pain    Historical Provider, MD  triamcinolone cream (KENALOG) 0.1 % Apply 1 application topically daily as needed.  11/17/15   Historical Provider, MD  vitamin B-12 (CYANOCOBALAMIN) 1000 MCG tablet Take 1,000 mcg by mouth daily.    Historical Provider, MD    Family History Family History  Problem Relation Age of Onset  . Diabetes Mother   . Diabetes Sister   . Cancer Brother   . Diabetes Brother     Social History Social History  Substance Use Topics  . Smoking status: Current Every Day Smoker    Packs/day: 2.00    Years: 45.00    Types: Cigarettes  .  Smokeless tobacco: Never Used  . Alcohol use No     Allergies   Chantix [varenicline]; Latex; Metformin and related; Silver; Wheat bran; Bean pod extract; and Tape   Review of Systems Review of Systems  All other systems reviewed and are negative.    Physical Exam Updated Vital Signs BP 125/83   Pulse 100   Temp 98.2 F (36.8 C)   Resp 18   Ht 6' (1.829 m)   Wt 211 lb (95.7 kg)   SpO2 97%   BMI 28.62 kg/m   Physical Exam  Nursing note and vitals reviewed.  60 year old male, resting comfortably and in no acute distress. Vital signs are normal. Oxygen saturation is 97%, which is normal. Head is normocephalic and atraumatic. PERRLA, EOMI. Oropharynx is clear. Neck has a large area of desquamation posteriorly. There are some areas where the skin is thickened and very tender but no definite areas of fluid collection. Back is nontender and there is no CVA tenderness. Therefore other areas of localized desquamation throughout the mid and upper back without significant tenderness. Lungs are clear without rales, wheezes, or rhonchi. Chest is nontender. Heart has regular rate and rhythm without murmur. Abdomen is soft, flat, nontender without masses or hepatosplenomegaly and peristalsis is normoactive. Extremities have no cyanosis or edema,  full range of motion is present. Skin is warm and dry without other rash. Neurologic: Mental status is normal, cranial nerves are intact, there are no motor or sensory deficits.  ED Treatments / Results   Procedures Procedures (including critical care time)  Medications Ordered in ED Medications  predniSONE (DELTASONE) tablet 60 mg (not administered)  HYDROmorphone (DILAUDID) injection 2 mg (not administered)  ondansetron (ZOFRAN-ODT) disintegrating tablet 8 mg (not administered)     Initial Impression / Assessment and Plan / ED Course  I have reviewed the triage vital signs and the nursing notes.  Pertinent labs & imaging results that were available during my care of the patient were reviewed by me and considered in my medical decision making (see chart for details).  Clinical Course    Chronic skin condition involving the neck and upper back with prominent desquamation. Patient is managed by appropriate physicians. No abscess area identified today. Old records are reviewed and he has several ED visits for the same complaint. On one occasion, there was an abscess that was not in his neck area per se. He is given an injection of hydromorphone and is sent home with prescription for prednisone. He is referred back to his treating dermatologist and wound specialists.  Final Clinical Impressions(s) / ED Diagnoses   Final diagnoses:  Localized skin desquamation    New Prescriptions New Prescriptions   No medications on file     Dione Boozeavid Wylee Ogden, MD 03/21/16 [phone]

## 2016-03-30 ENCOUNTER — Encounter (HOSPITAL_BASED_OUTPATIENT_CLINIC_OR_DEPARTMENT_OTHER): Payer: Self-pay | Admitting: Emergency Medicine

## 2016-03-30 ENCOUNTER — Emergency Department (HOSPITAL_BASED_OUTPATIENT_CLINIC_OR_DEPARTMENT_OTHER)
Admission: EM | Admit: 2016-03-30 | Discharge: 2016-03-31 | Disposition: A | Discharge status: Home / Self Care | Payer: Medicare HMO | Attending: Emergency Medicine | Admitting: Emergency Medicine

## 2016-03-30 DIAGNOSIS — E119 Type 2 diabetes mellitus without complications: Secondary | ICD-10-CM | POA: Insufficient documentation

## 2016-03-30 DIAGNOSIS — Y999 Unspecified external cause status: Secondary | ICD-10-CM | POA: Insufficient documentation

## 2016-03-30 DIAGNOSIS — L03011 Cellulitis of right finger: Secondary | ICD-10-CM | POA: Insufficient documentation

## 2016-03-30 DIAGNOSIS — Z7984 Long term (current) use of oral hypoglycemic drugs: Secondary | ICD-10-CM | POA: Diagnosis not present

## 2016-03-30 DIAGNOSIS — I10 Essential (primary) hypertension: Secondary | ICD-10-CM | POA: Diagnosis not present

## 2016-03-30 DIAGNOSIS — Y9389 Activity, other specified: Secondary | ICD-10-CM | POA: Diagnosis not present

## 2016-03-30 DIAGNOSIS — J45909 Unspecified asthma, uncomplicated: Secondary | ICD-10-CM | POA: Insufficient documentation

## 2016-03-30 DIAGNOSIS — X088XXA Exposure to other specified smoke, fire and flames, initial encounter: Secondary | ICD-10-CM | POA: Insufficient documentation

## 2016-03-30 DIAGNOSIS — J449 Chronic obstructive pulmonary disease, unspecified: Secondary | ICD-10-CM | POA: Insufficient documentation

## 2016-03-30 DIAGNOSIS — S6991XA Unspecified injury of right wrist, hand and finger(s), initial encounter: Secondary | ICD-10-CM | POA: Diagnosis present

## 2016-03-30 DIAGNOSIS — Y929 Unspecified place or not applicable: Secondary | ICD-10-CM | POA: Insufficient documentation

## 2016-03-30 DIAGNOSIS — Z79899 Other long term (current) drug therapy: Secondary | ICD-10-CM | POA: Diagnosis not present

## 2016-03-30 DIAGNOSIS — F17210 Nicotine dependence, cigarettes, uncomplicated: Secondary | ICD-10-CM | POA: Diagnosis not present

## 2016-03-30 DIAGNOSIS — Z7982 Long term (current) use of aspirin: Secondary | ICD-10-CM | POA: Insufficient documentation

## 2016-03-30 MED ORDER — CEPHALEXIN 250 MG PO CAPS
500.0000 mg | ORAL_CAPSULE | Freq: Once | ORAL | Status: AC
  Administered 2016-03-31: 500 mg via ORAL
  Filled 2016-03-30: qty 2

## 2016-03-30 MED ORDER — SULFAMETHOXAZOLE-TRIMETHOPRIM 800-160 MG PO TABS
1.0000 | ORAL_TABLET | Freq: Once | ORAL | Status: AC
  Administered 2016-03-31: 1 via ORAL
  Filled 2016-03-30: qty 1
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
## 2016-03-31 MED ORDER — CEPHALEXIN 500 MG PO CAPS: 500.0000 mg | ORAL_CAPSULE | Freq: Three times a day (TID) | ORAL | 0 refills | Status: DC

## 2016-03-31 MED ORDER — SULFAMETHOXAZOLE-TRIMETHOPRIM 800-160 MG PO TABS: 1.0000 | ORAL_TABLET | Freq: Two times a day (BID) | ORAL | 0 refills | Status: AC

## 2016-04-02 DIAGNOSIS — S61209A Unspecified open wound of unspecified finger without damage to nail, initial encounter: Secondary | ICD-10-CM | POA: Insufficient documentation

## 2016-05-01 DIAGNOSIS — F17200 Nicotine dependence, unspecified, uncomplicated: Secondary | ICD-10-CM | POA: Insufficient documentation

## 2016-05-08 ENCOUNTER — Ambulatory Visit: Payer: Medicare HMO | Admitting: Podiatry

## 2016-05-16 ENCOUNTER — Ambulatory Visit: Payer: Medicare HMO | Admitting: Podiatry

## 2016-05-24 DIAGNOSIS — M5020 Other cervical disc displacement, unspecified cervical region: Secondary | ICD-10-CM | POA: Insufficient documentation

## 2016-05-28 ENCOUNTER — Ambulatory Visit (INDEPENDENT_AMBULATORY_CARE_PROVIDER_SITE_OTHER): Payer: Medicare HMO | Admitting: Podiatry

## 2016-05-28 DIAGNOSIS — E1151 Type 2 diabetes mellitus with diabetic peripheral angiopathy without gangrene: Secondary | ICD-10-CM

## 2016-05-28 DIAGNOSIS — M79674 Pain in right toe(s): Secondary | ICD-10-CM | POA: Diagnosis not present

## 2016-05-28 DIAGNOSIS — B351 Tinea unguium: Secondary | ICD-10-CM

## 2016-05-28 NOTE — Progress Notes (Signed)
Patient ID: Alejandro Jones, male   DOB: 03/13/1956, 60 y.o.   MRN: [id number] HPI  Complaint:  Visit Type: Patient returns to my office for continued preventative foot care services. Complaint: Patient states" my nails have grown long and thick and become painful to walk and wear shoes" Patient has been diagnosed with DM with vascular and neuropathy. Marland Kitchen. He presents for preventative foot care services. No changes to ROS  Podiatric Exam: Vascular: dorsalis pedis and posterior tibial pulses are negative. Capillary return is immediate. Temperature gradient is negative. Skin turgor WNL,   Sensorium: Absent Semmes Weinstein monofilament test.   Nail Exam: Pt has thick disfigured discolored nails with subungual debris noted bilateral entire nail hallux through fifth toenails Ulcer Exam: There is no evidence of ulcer or pre-ulcerative changes or infection. Orthopedic Exam: Muscle tone and strength are WNL. No limitations in general ROM. No crepitus or effusions noted. Foot type and digits show no abnormalities. Bony prominences are unremarkable. Skin: No Porokeratosis.  No foot ulcers.  Diagnosis:  Tinea unguium, Pain in right toe, pain in left toes  Treatment & Plan Procedures and Treatment: Consent by patient was obtained for treatment procedures. The patient understood the discussion of treatment and procedures well. All questions were answered thoroughly reviewed. Debridement of mycotic and hypertrophic toenails, 1 through 5 bilateral and clearing of subungual debris. No ulceration, no infection noted. DPN with neuropathy, angiopathy and ulcer. Re do paperwork for diabetic insoles. Return Visit-Office Procedure: Patient instructed to return to the office for a follow up visit   3 months for continued evaluation and treatment.   Helane GuntherGregory Ruston Fedora DPM

## 2016-07-02 ENCOUNTER — Telehealth: Payer: Self-pay | Admitting: *Deleted

## 2016-07-02 NOTE — Telephone Encounter (Signed)
Pt. Says he needs the paperwork for diabetic shoes faxed over to [facility], they havent received any paperwork.

## 2016-07-03 ENCOUNTER — Encounter: Payer: Self-pay | Admitting: Family

## 2016-07-04 ENCOUNTER — Encounter (HOSPITAL_BASED_OUTPATIENT_CLINIC_OR_DEPARTMENT_OTHER): Payer: Self-pay | Admitting: *Deleted

## 2016-07-04 ENCOUNTER — Emergency Department (HOSPITAL_BASED_OUTPATIENT_CLINIC_OR_DEPARTMENT_OTHER)
Admission: EM | Admit: 2016-07-04 | Discharge: 2016-07-04 | Disposition: A | Discharge status: Home / Self Care | Payer: Medicare HMO | Attending: Emergency Medicine | Admitting: Emergency Medicine

## 2016-07-04 DIAGNOSIS — L0211 Cutaneous abscess of neck: Secondary | ICD-10-CM | POA: Insufficient documentation

## 2016-07-04 DIAGNOSIS — L02212 Cutaneous abscess of back [any part, except buttock]: Secondary | ICD-10-CM | POA: Diagnosis not present

## 2016-07-04 DIAGNOSIS — Z7982 Long term (current) use of aspirin: Secondary | ICD-10-CM | POA: Insufficient documentation

## 2016-07-04 DIAGNOSIS — I10 Essential (primary) hypertension: Secondary | ICD-10-CM | POA: Diagnosis not present

## 2016-07-04 DIAGNOSIS — Z7984 Long term (current) use of oral hypoglycemic drugs: Secondary | ICD-10-CM | POA: Diagnosis not present

## 2016-07-04 DIAGNOSIS — F17210 Nicotine dependence, cigarettes, uncomplicated: Secondary | ICD-10-CM | POA: Diagnosis not present

## 2016-07-04 DIAGNOSIS — L24A9 Irritant contact dermatitis due friction or contact with other specified body fluids: Secondary | ICD-10-CM

## 2016-07-04 DIAGNOSIS — J449 Chronic obstructive pulmonary disease, unspecified: Secondary | ICD-10-CM | POA: Diagnosis not present

## 2016-07-04 DIAGNOSIS — Z79899 Other long term (current) drug therapy: Secondary | ICD-10-CM | POA: Insufficient documentation

## 2016-07-04 DIAGNOSIS — E119 Type 2 diabetes mellitus without complications: Secondary | ICD-10-CM | POA: Diagnosis not present

## 2016-07-04 DIAGNOSIS — J45909 Unspecified asthma, uncomplicated: Secondary | ICD-10-CM | POA: Diagnosis not present

## 2016-07-04 DIAGNOSIS — T148XXA Other injury of unspecified body region, initial encounter: Secondary | ICD-10-CM

## 2016-07-04 MED ORDER — CEPHALEXIN 500 MG PO CAPS: 500.0000 mg | ORAL_CAPSULE | Freq: Four times a day (QID) | ORAL | 0 refills | Status: AC

## 2016-07-04 MED ORDER — MUPIROCIN 2 % EX OINT: 1.0000 "application " | TOPICAL_OINTMENT | Freq: Two times a day (BID) | CUTANEOUS | 0 refills | Status: DC

## 2016-07-04 MED ORDER — MUPIROCIN 2 % EX OINT: TOPICAL_OINTMENT | CUTANEOUS | 0 refills | Status: DC

## 2016-07-04 NOTE — ED Provider Notes (Signed)
[facility] Note   CSN: [id number] Arrival date & time: 07/04/16  1657     History   Chief Complaint Chief Complaint  Patient presents with  . Abscess    HPI Alejandro Jones is a 60 y.o. male.  HPI  60 year old male with an extensive past medical history including hypertension, diabetes, significant allergies, chronic neck and back wounds that are currently being treated by dermatology presents to the ED with pain associated with these wounds. Patient reports that they have tried several topical medications over the past year without cure. They are unsure of what the cause is. He did do endorse that the patient has poor nail hygiene and scratches his skin off and likely exacerbating the wounds causing infections. They deny any fevers, chills, decreased appetite, chest pain, shortness of breath.   Past Medical History:  Diagnosis Date  . Acid reflux   . Asthma   . Chronic neck pain   . Constipation   . COPD (chronic obstructive pulmonary disease) (HCC)   . Diabetes mellitus   . Hypertension   . Peripheral vascular disease [facility])     Patient Active Problem List   Diagnosis Date Noted  . SIRS (systemic inflammatory response syndrome) (HCC) 01/04/2016  . COPD exacerbation (HCC) 01/04/2016  . Type 2 diabetes mellitus with vascular disease (HCC) 01/04/2016  . Lower extremity edema 01/04/2016  . Essential hypertension 08/22/2015  . Hyperlipidemia 08/22/2015  . Tobacco abuse 08/22/2015  . Atherosclerosis of native arteries of extremity with intermittent claudication (HCC) 06/22/2014  . PVD (peripheral vascular disease) (HCC) 03/31/2013  . Pain in limb-Bilateral leg 01/27/2013  . Peripheral vascular disease, unspecified 01/27/2013  . Ischemic toe 12/23/2012  . Atherosclerosis of native arteries of the extremities with intermittent claudication 10/07/2012    Past Surgical History:  Procedure Laterality Date  . ABDOMINAL AORTAGRAM N/A 05/10/2013   Procedure: ABDOMINAL Ronny Flurry;  Surgeon: Chuck Hint, MD;  Location[facility] CATH LAB;  Service: Cardiovascular;  Laterality: N/A;  . ANKLE SURGERY    . ILIAC ARTERY STENT    . LOWER EXTREMITY ANGIOGRAM Bilateral 05/10/2013   Procedure: LOWER EXTREMITY ANGIOGRAM;  Surgeon: Chuck Hint, MD;  Location: [facility] CATH LAB;  Service: Cardiovascular;  Laterality: Bilateral;       Home Medications    Prior to Admission medications   Medication Sig Start Date End Date Taking? Authorizing Provider  acetaminophen (TYLENOL) 500 MG tablet Take 1,000 mg by mouth every 6 (six) hours as needed for moderate pain.    Historical Provider, MD  albuterol (PROVENTIL) (2.5 MG/3ML) 0.083% nebulizer solution Take 2.5 mg by nebulization every 6 (six) hours as needed for wheezing or shortness of breath. Reported on 10/31/2015    Historical Provider, MD  albuterol (VENTOLIN HFA) 108 (90 Base) MCG/ACT inhaler Inhale 2 puffs into the lungs every 6 (six) hours as needed for wheezing or shortness of breath.    Historical Provider, MD  aspirin EC 81 MG tablet Take 81 mg by mouth daily.      Historical Provider, MD  atorvastatin (LIPITOR) 20 MG tablet Take 20 mg by mouth daily at 6 PM.     Historical Provider, MD  cephALEXin (KEFLEX) 500 MG capsule Take 1 capsule (500 mg total) by mouth 4 (four) times daily. 07/04/16 07/11/16  Nira Conn, MD  dextromethorphan (DELSYM) 30 MG/5ML liquid Take 60 mg by mouth daily as needed for cough.     Historical Provider, MD  docusate sodium (COLACE) 100 MG  capsule Take 300 mg by mouth at bedtime.     Historical Provider, MD  escitalopram (LEXAPRO) 10 MG tablet Take 10 mg by mouth daily. 11/18/15   Historical Provider, MD  ferrous sulfate 325 (65 FE) MG EC tablet Take 325 mg by mouth daily.  09/26/15 01/04/16  Historical Provider, MD  Fluticasone Furoate-Vilanterol (BREO ELLIPTA) 100-25 MCG/INH AEPB Inhale 1 puff into the lungs daily as needed (for shortness of breath).     Historical Provider, MD  furosemide (LASIX) 40 MG tablet Take 40 mg by mouth.    Historical Provider, MD  gentian violet 2 % topical solution Apply 0.5-1 mLs topically every 3 (three) days.  12/12/15   Historical Provider, MD  glipiZIDE (GLUCOTROL) 10 MG tablet Take 10 mg by mouth daily before breakfast.    Historical Provider, MD  HYDROmorphone (DILAUDID) 4 MG tablet Take 1 tablet (4 mg total) by mouth every 6 (six) hours as needed for severe pain. 02/16/16   Vanetta MuldersScott Zackowski, MD  ibuprofen (ADVIL,MOTRIN) 200 MG tablet Take 600 mg by mouth every 6 (six) hours as needed. For pain.    Historical Provider, MD  lidocaine (LIDODERM) 5 % Place 2 patches onto the skin daily as needed (for pain). Remove & Discard patch within 12 hours or as directed by MD    Historical Provider, MD  losartan-hydrochlorothiazide (HYZAAR) 100-12.5 MG per tablet Take 1 tablet by mouth daily.    Historical Provider, MD  MAGNESIUM PO Take 1 tablet by mouth daily.    Historical Provider, MD  Menthol, Topical Analgesic, 5 % PADS Apply 1 patch topically at bedtime as needed (for leg pain).     Historical Provider, MD  Menthol-Methyl Salicylate (MUSCLE RUB EX) Use as directed 12/13/15   Historical Provider, MD  mupirocin ointment (BACTROBAN) 2 % Place 1 application into the nose 2 (two) times daily. 07/04/16 07/11/16  Nira ConnPedro Eduardo Cardama, MD  NITROSTAT 0.4 MG SL tablet Place 0.4 mg under the tongue every 5 (five) minutes as needed for chest pain.  05/08/15   Historical Provider, MD  omeprazole (PRILOSEC) 20 MG capsule Take 20 mg by mouth daily.    Historical Provider, MD  pioglitazone (ACTOS) 30 MG tablet Take 30 mg by mouth daily.    Historical Provider, MD  predniSONE (DELTASONE) 50 MG tablet Take 1 tablet daily 01/04/16   Isa RankinAnn B Smith, MD  predniSONE (DELTASONE) 50 MG tablet Take 1 tablet (50 mg total) by mouth daily. 03/21/16   Dione Boozeavid Glick, MD  Saxagliptin-Metformin (KOMBIGLYZE XR) 2.12-998 MG TB24 Take 1 tablet by mouth 2 (two) times  daily.    Historical Provider, MD  topiramate (TOPAMAX) 100 MG tablet Take 100 mg by mouth 2 (two) times daily.    Historical Provider, MD  traMADol (ULTRAM) 50 MG tablet Take 50 mg by mouth every 6 (six) hours as needed. For pain    Historical Provider, MD  triamcinolone cream (KENALOG) 0.1 % Apply 1 application topically daily as needed.  11/17/15   Historical Provider, MD  vitamin B-12 (CYANOCOBALAMIN) 1000 MCG tablet Take 1,000 mcg by mouth daily.    Historical Provider, MD    Family History Family History  Problem Relation Age of Onset  . Diabetes Mother   . Diabetes Sister   . Cancer Brother   . Diabetes Brother     Social History Social History  Substance Use Topics  . Smoking status: Current Every Day Smoker    Packs/day: 2.00    Years: 45.00  Types: Cigarettes  . Smokeless tobacco: Never Used  . Alcohol use No     Allergies   Chantix [varenicline]; Latex; Metformin and related; Silver; Wheat bran; Bean pod extract; and Tape   Review of Systems Review of Systems Ten systems are reviewed and are negative for acute change except as noted in the HPI   Physical Exam Updated Vital Signs BP 102/69   Pulse 96   Temp 97.7 F (36.5 C) (Oral)   Resp 22   Ht 6' (1.829 m)   Wt 196 lb (88.9 kg)   SpO2 93%   BMI 26.58 kg/m   Physical Exam  Constitutional: He is oriented to person, place, and time. He appears well-developed and well-nourished. No distress.  HENT:  Head: Normocephalic and atraumatic.  Nose: Nose normal.  Eyes: Conjunctivae and EOM are normal. Pupils are equal, round, and reactive to light. Right eye exhibits no discharge. Left eye exhibits no discharge. No scleral icterus.  Neck: Normal range of motion. Neck supple.    Cardiovascular: Normal rate and regular rhythm.  Exam reveals no gallop and no friction rub.   No murmur heard. Pulmonary/Chest: Effort normal and breath sounds normal. No stridor. No respiratory distress. He has no rales.    Abdominal: Soft. He exhibits no distension. There is no tenderness.  Musculoskeletal: He exhibits no edema or tenderness.       Back:  Neurological: He is alert and oriented to person, place, and time.  Skin: Skin is warm and dry. Lesion (noted in imaging. surounding hyperemia. mild purulence. no induration or fluctuance. ) noted. No rash noted. He is not diaphoretic.  Psychiatric: He has a normal mood and affect.  Vitals reviewed.    ED Treatments / Results  Labs (all labs ordered are listed, but only abnormal results are displayed) Labs Reviewed - No data to display  EKG  EKG Interpretation None       Radiology No results found.  Procedures Procedures (including critical care time)  Medications Ordered in ED Medications - No data to display   Initial Impression / Assessment and Plan / ED Course  I have reviewed the triage vital signs and the nursing notes.  Pertinent labs & imaging results that were available during my care of the patient were reviewed by me and considered in my medical decision making (see chart for details).  Clinical Course     Chronic wounds with surrounding hyperemia. No evidence of associated abscesses. Possible superimposed infection. We'll provide patient with prescription for topical and oral antibiotics. Close follow-up with dermatology recommended.  Final Clinical Impressions(s) / ED Diagnoses   Final diagnoses:  Wound drainage   Disposition: Discharge  Condition: Good  I have discussed the results, Dx and Tx plan with the patient who expressed understanding and agree(s) with the plan. Discharge instructions discussed at great length. The patient was given strict return precautions who verbalized understanding of the instructions. No further questions at time of discharge.    New Prescriptions   CEPHALEXIN (KEFLEX) 500 MG CAPSULE    Take 1 capsule (500 mg total) by mouth 4 (four) times daily.   MUPIROCIN OINTMENT (BACTROBAN) 2 %     Place 1 application into the nose 2 (two) times daily.    Follow Up: Diana EvesYuri M. Luiz Ironabeza, MD [address] [place] [id number] [phone]  Schedule an appointment as soon as possible for a visit  For close follow up to assess for continued wound management  Nira Conn, MD 07/04/16 [phone]

## 2016-07-04 NOTE — ED Notes (Signed)
ED Provider at bedside. 

## 2016-07-04 NOTE — ED Notes (Signed)
Pt. Has bad skin conditions with history of scratching his neck and picking areas of his body.

## 2016-07-04 NOTE — ED Triage Notes (Signed)
Abscess on the back of his neck that has been draining on and off for a year.

## 2016-07-10 ENCOUNTER — Ambulatory Visit: Payer: Self-pay | Admitting: Family

## 2016-07-10 ENCOUNTER — Encounter (HOSPITAL_COMMUNITY): Payer: Self-pay

## 2016-08-16 ENCOUNTER — Encounter: Payer: Self-pay | Admitting: Family

## 2016-08-21 ENCOUNTER — Ambulatory Visit: Payer: Self-pay | Admitting: Vascular Surgery

## 2016-08-21 ENCOUNTER — Encounter (HOSPITAL_COMMUNITY): Payer: Medicare HMO

## 2016-08-28 ENCOUNTER — Ambulatory Visit: Payer: Medicare HMO | Admitting: Podiatry

## 2016-09-09 ENCOUNTER — Ambulatory Visit: Payer: Medicare HMO | Admitting: *Deleted

## 2016-09-09 DIAGNOSIS — E1151 Type 2 diabetes mellitus with diabetic peripheral angiopathy without gangrene: Secondary | ICD-10-CM

## 2016-09-09 NOTE — Progress Notes (Signed)
Patient ID: Anne FuRickie Hubert, male   DOB: 07/28/1956, 61 y.o.   MRN: [id number]   Patient presents to be scanned and measured for diabetic shoes and inserts.

## 2016-09-25 ENCOUNTER — Encounter: Payer: Self-pay | Admitting: Vascular Surgery

## 2016-10-02 ENCOUNTER — Ambulatory Visit (HOSPITAL_COMMUNITY)
Admission: RE | Admit: 2016-10-02 | Discharge: 2016-10-02 | Disposition: A | Discharge status: Home / Self Care | Payer: Medicare HMO | Source: Ambulatory Visit | Attending: Vascular Surgery | Admitting: Vascular Surgery

## 2016-10-02 ENCOUNTER — Encounter: Payer: Self-pay | Admitting: Vascular Surgery

## 2016-10-02 ENCOUNTER — Ambulatory Visit (INDEPENDENT_AMBULATORY_CARE_PROVIDER_SITE_OTHER): Payer: Medicare HMO | Admitting: Vascular Surgery

## 2016-10-02 VITALS — BP 95/64 | HR 100 | Temp 97.6°F | Resp 16 | Ht 72.0 in | Wt 187.0 lb

## 2016-10-02 DIAGNOSIS — I739 Peripheral vascular disease, unspecified: Secondary | ICD-10-CM | POA: Insufficient documentation

## 2016-10-02 DIAGNOSIS — I7789 Other specified disorders of arteries and arterioles: Secondary | ICD-10-CM | POA: Insufficient documentation

## 2016-10-02 NOTE — Progress Notes (Signed)
Patient name: Alejandro Jones MRN: [id number] DOB: [birth year] Sex: male  REASON FOR VISIT: Follow up of peripheral vascular disease.  HPI: Alejandro Jones is a 61 y.o. male who I last saw on 07/05/2015. The patient has undergone previous left common iliac artery stenting in [place]. The patient has a known left common iliac artery occlusion. The patient had stable claudication at that time and we discussed tobacco cessation and a structured walking program. Patient had significant cardiac and pulmonary issues and I was reluctant to consider right to left femorofemoral bypass grafting unless the symptoms progressed significantly. The patient comes in for a 1 year follow up visit.  He continues to have claudication in his left calf which is brought on by ambulation and relieved with rest. He has developed some claudication in the right calf also. His symptoms have gradually progressed over the last year. He denies any history of rest pain or history of nonhealing ulcers.  He does have a history of COPD and congestive heart failure. He was hospitalized I believe in the summer of last year with shortness of breath. He smokes 2 packs per day of cigarettes and has been smoking for 50 years. For these reasons he is not a good candidate for surgery.  Past Medical History:  Diagnosis Date  . Acid reflux   . Asthma   . Chronic neck pain   . Constipation   . COPD (chronic obstructive pulmonary disease) (HCC)   . Diabetes mellitus   . Hypertension   . Peripheral vascular disease (HCC)     Family History  Problem Relation Age of Onset  . Diabetes Mother   . Diabetes Sister   . Cancer Brother   . Diabetes Brother     SOCIAL HISTORY: Social History  Substance Use Topics  . Smoking status: Current Every Day Smoker    Packs/day: 2.00    Years: 45.00    Types: Cigarettes  . Smokeless tobacco: Never Used  . Alcohol use No    Allergies  Allergen Reactions  . Chantix [Varenicline] Other  (See Comments)    Extreme anger and depression   . Latex Itching and Hives  . Metformin And Related Nausea Only  . Silver Other (See Comments)    Wife states "it gets worse"  . Wheat Bran Itching    WHEAT  . Bean Pod Extract Itching and Rash    PINTO BEANS  . Tape Rash    TEGADERM- TEARS THE SKIN TEGADERM- TEARS THE SKIN    Current Outpatient Prescriptions  Medication Sig Dispense Refill  . acetaminophen (TYLENOL) 500 MG tablet Take 1,000 mg by mouth every 6 (six) hours as needed for moderate pain.    Marland Kitchen albuterol (PROVENTIL) (2.5 MG/3ML) 0.083% nebulizer solution Take 2.5 mg by nebulization every 6 (six) hours as needed for wheezing or shortness of breath. Reported on 10/31/2015    . albuterol (VENTOLIN HFA) 108 (90 Base) MCG/ACT inhaler Inhale 2 puffs into the lungs every 6 (six) hours as needed for wheezing or shortness of breath.    Marland Kitchen aspirin EC 81 MG tablet Take 81 mg by mouth daily.      Marland Kitchen atorvastatin (LIPITOR) 20 MG tablet Take 20 mg by mouth daily at 6 PM.     . dextromethorphan (DELSYM) 30 MG/5ML liquid Take 60 mg by mouth daily as needed for cough.     . docusate sodium (COLACE) 100 MG capsule Take 300 mg by mouth at bedtime.     Marland Kitchen  escitalopram (LEXAPRO) 10 MG tablet Take 10 mg by mouth daily.    . Fluticasone Furoate-Vilanterol (BREO ELLIPTA) 100-25 MCG/INH AEPB Inhale 1 puff into the lungs daily as needed (for shortness of breath).    . furosemide (LASIX) 40 MG tablet Take 40 mg by mouth.    Marland Kitchen ibuprofen (ADVIL,MOTRIN) 200 MG tablet Take 600 mg by mouth every 6 (six) hours as needed. For pain.    Marland Kitchen lidocaine (LIDODERM) 5 % Place 2 patches onto the skin daily as needed (for pain). Remove & Discard patch within 12 hours or as directed by MD    . MAGNESIUM PO Take 1 tablet by mouth daily.    . Menthol, Topical Analgesic, 5 % PADS Apply 1 patch topically at bedtime as needed (for leg pain).     . Menthol-Methyl Salicylate (MUSCLE RUB EX) Use as directed    .  Saxagliptin-Metformin (KOMBIGLYZE XR) 2.12-998 MG TB24 Take 1 tablet by mouth 2 (two) times daily.    Marland Kitchen topiramate (TOPAMAX) 100 MG tablet Take 100 mg by mouth 2 (two) times daily.    . traMADol (ULTRAM) 50 MG tablet Take 50 mg by mouth every 6 (six) hours as needed. For pain    . triamcinolone cream (KENALOG) 0.1 % Apply 1 application topically daily as needed.     . vitamin B-12 (CYANOCOBALAMIN) 1000 MCG tablet Take 1,000 mcg by mouth daily.    . ferrous sulfate 325 (65 FE) MG EC tablet Take 325 mg by mouth daily.      No current facility-administered medications for this visit.     REVIEW OF SYSTEMS:  [X]  denotes positive finding, [ ]  denotes negative finding Cardiac  Comments:  Chest pain or chest pressure: X   Shortness of breath upon exertion: X   Short of breath when lying flat:    Irregular heart rhythm:        Vascular    Pain in calf, thigh, or hip brought on by ambulation: X   Pain in feet at night that wakes you up from your sleep:     Blood clot in your veins:    Leg swelling:  X       Pulmonary    Oxygen at home:    Productive cough:  X   Wheezing:  X       Neurologic    Sudden weakness in arms or legs:     Sudden numbness in arms or legs:     Sudden onset of difficulty speaking or slurred speech:    Temporary loss of vision in one eye:     Problems with dizziness:  X       Gastrointestinal    Blood in stool:     Vomited blood:         Genitourinary    Burning when urinating:     Blood in urine:        Psychiatric    Major depression:         Hematologic    Bleeding problems:    Problems with blood clotting too easily:        Skin    Rashes or ulcers: X       Constitutional    Fever or chills:      PHYSICAL EXAM: Vitals:   10/02/16 1354  BP: 95/64  Pulse: 100  Resp: 16  Temp: 97.6 F (36.4 C)  TempSrc: Oral  SpO2: 99%  Weight: 187 lb (84.8 kg)  Height:  6' (1.829 m)    GENERAL: The patient is a well-nourished male, in no acute  distress. The vital signs are documented above. CARDIAC: There is a regular rate and rhythm.  VASCULAR: I do not detect carotid bruits. On the left side I cannot palpate a femoral pulse popliteal pulses or pedal pulses. On the right side he has a palpable femoral pulse. I cannot palpate a popliteal or pedal pulses. He has no significant lower extremity swelling.  PULMONARY: There is good air exchange bilaterally without wheezing or rales. ABDOMEN: Soft and non-tender with normal pitched bowel sounds.  MUSCULOSKELETAL: There are no major deformities or cyanosis. NEUROLOGIC: No focal weakness or paresthesias are detected. SKIN: There are no ulcers or rashes noted. PSYCHIATRIC: The patient has a normal affect.  DATA:   BILATERAL LOWER EXTREMITY ARTERIAL DOPPLER STUDY: I have independently interpreted the bilateral lower extreme arterial Doppler study.  On the right side, there is a triphasic posterior tibial signal with a monophasic dorsalis pedis signal. ABI is 91%. Toe pressure on the right is 110 mmHg.  On the left side, which is the symptomatic side, ABI 64%. This has not changed in the last year. There is a monophasic dorsalis pedis and posterior tibial signal. Toe pressure on the left is 89 mmHg.  MEDICAL ISSUES:  CHRONIC LEFT ILIAC ARTERY OCCLUSION: This patient has a known left common iliac artery occlusion. He is not a good candidate for surgery because of his congestive heart failure and COPD. He continues to smoke 2 packs per day. We have again discussed the importance of tobacco cessation. I've encouraged him to stay as active as possible. I've ordered follow up ABIs in 1 year and I'll see him back at that time. He knows to call sooner if he has problems.  Waverly Ferrariickson, Janeal [facility] [place] [facility] [phone]

## 2016-10-08 NOTE — Addendum Note (Signed)
Addended by: Burton ApleyPETTY, Candace Begue A on: 10/08/2016 03:28 PM   Modules accepted: Orders

## 2016-10-22 ENCOUNTER — Encounter: Payer: Self-pay | Admitting: Podiatry

## 2016-10-22 ENCOUNTER — Ambulatory Visit (INDEPENDENT_AMBULATORY_CARE_PROVIDER_SITE_OTHER): Payer: Medicare HMO | Admitting: Podiatry

## 2016-10-22 DIAGNOSIS — E1140 Type 2 diabetes mellitus with diabetic neuropathy, unspecified: Secondary | ICD-10-CM

## 2016-10-22 DIAGNOSIS — M79674 Pain in right toe(s): Secondary | ICD-10-CM | POA: Diagnosis not present

## 2016-10-22 DIAGNOSIS — I70245 Atherosclerosis of native arteries of left leg with ulceration of other part of foot: Secondary | ICD-10-CM

## 2016-10-22 DIAGNOSIS — E1151 Type 2 diabetes mellitus with diabetic peripheral angiopathy without gangrene: Secondary | ICD-10-CM

## 2016-10-22 DIAGNOSIS — B351 Tinea unguium: Secondary | ICD-10-CM | POA: Diagnosis not present

## 2016-10-22 NOTE — Progress Notes (Signed)
Patient ID: Alejandro Jones, male   DOB: 11/14/1955, 61 y.o.   MRN: [id number] HPI  Complaint:  Visit Type: Patient returns to my office for continued preventative foot care services. Complaint: Patient states" my nails have grown long and thick and become painful to walk and wear shoes" Patient has been diagnosed with DM with vascular and neuropathy. Marland Kitchen. He presents for preventative foot care services. No changes to ROS  Podiatric Exam: Vascular: dorsalis pedis and posterior tibial pulses are negative. Capillary return is immediate. Temperature gradient is negative. Skin turgor WNL,   Sensorium: Absent Semmes Weinstein monofilament test.   Nail Exam: Pt has thick disfigured discolored nails with subungual debris noted bilateral entire nail hallux through fifth toenails Ulcer Exam: There is no evidence of ulcer or pre-ulcerative changes or infection. Orthopedic Exam: Muscle tone and strength are WNL. No limitations in general ROM. No crepitus or effusions noted. Foot type and digits show no abnormalities. Bony prominences are unremarkable. Skin: No Porokeratosis.  No foot ulcers.  Diagnosis:  Tinea unguium, Pain in right toe, pain in left toes  Treatment & Plan Procedures and Treatment: Consent by patient was obtained for treatment procedures. The patient understood the discussion of treatment and procedures well. All questions were answered thoroughly reviewed. Debridement of mycotic and hypertrophic toenails, 1 through 5 bilateral and clearing of subungual debris. Patient to receive his diabetic shoes today. Return Visit-Office Procedure: Patient instructed to return to the office for a follow up visit   3 months for continued evaluation and treatment.   Alejandro Jones DPM

## 2016-12-16 DIAGNOSIS — H0100A Unspecified blepharitis right eye, upper and lower eyelids: Secondary | ICD-10-CM | POA: Insufficient documentation

## 2017-01-21 ENCOUNTER — Ambulatory Visit: Payer: Medicare HMO | Admitting: Podiatry

## 2017-02-18 ENCOUNTER — Encounter: Payer: Self-pay | Admitting: Podiatry

## 2017-02-18 ENCOUNTER — Ambulatory Visit (INDEPENDENT_AMBULATORY_CARE_PROVIDER_SITE_OTHER): Payer: Medicare HMO | Admitting: Podiatry

## 2017-02-18 DIAGNOSIS — E1140 Type 2 diabetes mellitus with diabetic neuropathy, unspecified: Secondary | ICD-10-CM | POA: Diagnosis not present

## 2017-02-18 DIAGNOSIS — M79674 Pain in right toe(s): Secondary | ICD-10-CM

## 2017-02-18 DIAGNOSIS — E1151 Type 2 diabetes mellitus with diabetic peripheral angiopathy without gangrene: Secondary | ICD-10-CM

## 2017-02-18 DIAGNOSIS — B351 Tinea unguium: Secondary | ICD-10-CM

## 2017-02-18 NOTE — Progress Notes (Signed)
Patient ID: Alejandro Jones, male   DOB: 09/16/1955, 61 y.o.   MRN: [id number] HPI  Complaint:  Visit Type: Patient returns to my office for continued preventative foot care services. Complaint: Patient states" my nails have grown long and thick and become painful to walk and wear shoes" Patient has been diagnosed with DM with vascular and neuropathy. . He presents for preventative foot care services. No changes to ROS  Podiatric Exam: Vascular: dorsalis pedis and posterior tibial pulses are negative. Capillary return is immediate. Temperature gradient is negative. Skin turgor WNL,   Sensorium: Absent Semmes Weinstein monofilament test.   Nail Exam: Pt has thick disfigured discolored nails with subungual debris noted bilateral entire nail hallux through fifth toenails Ulcer Exam: There is no evidence of ulcer or pre-ulcerative changes or infection. Orthopedic Exam: Muscle tone and strength are WNL. No limitations in general ROM. No crepitus or effusions noted. Foot type and digits show no abnormalities. Bony prominences are unremarkable. Skin: No Porokeratosis.  No foot ulcers.  Diagnosis:  Tinea unguium, Pain in right toe, pain in left toes  Treatment & Plan Procedures and Treatment: Consent by patient was obtained for treatment procedures. The patient understood the discussion of treatment and procedures well. All questions were answered thoroughly reviewed. Debridement of mycotic and hypertrophic toenails, 1 through 5 bilateral and clearing of subungual debris. Patient to receive his diabetic shoes today. Return Visit-Office Procedure: Patient instructed to return to the office for a follow up visit   4 months for continued evaluation and treatment.   Alejandro Jones DPM  

## 2017-03-23 ENCOUNTER — Emergency Department
Admission: EM | Admit: 2017-03-23 | Discharge: 2017-03-23 | Disposition: A | Discharge status: Home / Self Care | Payer: Medicare HMO | Attending: Emergency Medicine | Admitting: Emergency Medicine

## 2017-03-23 DIAGNOSIS — Y999 Unspecified external cause status: Secondary | ICD-10-CM | POA: Diagnosis not present

## 2017-03-23 DIAGNOSIS — F17210 Nicotine dependence, cigarettes, uncomplicated: Secondary | ICD-10-CM | POA: Diagnosis not present

## 2017-03-23 DIAGNOSIS — J449 Chronic obstructive pulmonary disease, unspecified: Secondary | ICD-10-CM | POA: Diagnosis not present

## 2017-03-23 DIAGNOSIS — E119 Type 2 diabetes mellitus without complications: Secondary | ICD-10-CM | POA: Insufficient documentation

## 2017-03-23 DIAGNOSIS — X58XXXA Exposure to other specified factors, initial encounter: Secondary | ICD-10-CM | POA: Diagnosis not present

## 2017-03-23 DIAGNOSIS — I10 Essential (primary) hypertension: Secondary | ICD-10-CM | POA: Insufficient documentation

## 2017-03-23 DIAGNOSIS — Z91040 Latex allergy status: Secondary | ICD-10-CM | POA: Diagnosis not present

## 2017-03-23 DIAGNOSIS — Z23 Encounter for immunization: Secondary | ICD-10-CM | POA: Insufficient documentation

## 2017-03-23 DIAGNOSIS — J45909 Unspecified asthma, uncomplicated: Secondary | ICD-10-CM | POA: Insufficient documentation

## 2017-03-23 DIAGNOSIS — R600 Localized edema: Secondary | ICD-10-CM | POA: Diagnosis not present

## 2017-03-23 DIAGNOSIS — R21 Rash and other nonspecific skin eruption: Secondary | ICD-10-CM | POA: Diagnosis present

## 2017-03-23 DIAGNOSIS — S50812A Abrasion of left forearm, initial encounter: Secondary | ICD-10-CM | POA: Insufficient documentation

## 2017-03-23 DIAGNOSIS — S41102A Unspecified open wound of left upper arm, initial encounter: Secondary | ICD-10-CM

## 2017-03-23 DIAGNOSIS — Y939 Activity, unspecified: Secondary | ICD-10-CM | POA: Insufficient documentation

## 2017-03-23 DIAGNOSIS — Y929 Unspecified place or not applicable: Secondary | ICD-10-CM | POA: Diagnosis not present

## 2017-03-23 LAB — GLUCOSE, CAPILLARY: GLUCOSE-CAPILLARY: 121 mg/dL — AB (ref 65–99)

## 2017-03-23 MED ORDER — CLINDAMYCIN HCL 150 MG PO CAPS
300.0000 mg | ORAL_CAPSULE | Freq: Once | ORAL | Status: AC
  Administered 2017-03-23: 300 mg via ORAL
  Filled 2017-03-23: qty 2

## 2017-03-23 MED ORDER — CLINDAMYCIN HCL 300 MG PO CAPS: 300.0000 mg | ORAL_CAPSULE | Freq: Three times a day (TID) | ORAL | 0 refills | Status: AC

## 2017-03-23 MED ORDER — PREDNISONE 20 MG PO TABS
50.0000 mg | ORAL_TABLET | Freq: Once | ORAL | Status: AC
  Administered 2017-03-23: 50 mg via ORAL
  Filled 2017-03-23: qty 1

## 2017-03-23 MED ORDER — TETANUS-DIPHTH-ACELL PERTUSSIS 5-2.5-18.5 LF-MCG/0.5 IM SUSP
0.5000 mL | Freq: Once | INTRAMUSCULAR | Status: AC
  Administered 2017-03-23: 0.5 mL via INTRAMUSCULAR
  Filled 2017-03-23: qty 0.5

## 2017-03-23 MED ORDER — HYDROXYZINE HCL 10 MG PO TABS: 10.0000 mg | ORAL_TABLET | Freq: Three times a day (TID) | ORAL | 0 refills | Status: AC | PRN

## 2017-03-23 MED ORDER — HYDROXYZINE HCL 25 MG PO TABS
25.0000 mg | ORAL_TABLET | Freq: Once | ORAL | Status: AC
  Administered 2017-03-23: 25 mg via ORAL
  Filled 2017-03-23: qty 1

## 2017-03-23 MED ORDER — PREDNISONE 10 MG PO TABS: 20.0000 mg | ORAL_TABLET | Freq: Every day | ORAL | 0 refills | Status: AC

## 2017-03-23 NOTE — ED Notes (Signed)
Patient verbalizes understanding of discharge instructions, prescriptions, home care and follow up care. Patient out of department at this time with family. 

## 2017-03-23 NOTE — ED Triage Notes (Signed)
Reports area to left forearm started at rash and itching.  Reports concerned it might be infected.  Area there for approximately 1 week.

## 2017-03-23 NOTE — ED Provider Notes (Signed)
[facility] Emergency Department Provider Note  ____________________________________________  Time seen: Approximately 10:23 PM  I have reviewed the triage vital signs and the nursing notes.   HISTORY  Chief Complaint Rash and Wound Infection    HPI Alejandro Jones is a 61 y.o. male that presents to emergency department for evaluation of left forearm rash. Patient states that he scratched the skin so much he caused a wound. Patient is concerned that wound and his entire arm itches and wife is concerned for infection. He states that his arm has been swelling around the area. He has not noticed any drainage from wound. He has diabetes and his blood sugars usually run in the 120s. He is allergic to Benadryl.No fever, shortness breath, chest pain, nausea, vomiting, abdominal pain, numbness, tingling.   Past Medical History:  Diagnosis Date  . Acid reflux   . Asthma   . Chronic neck pain   . Constipation   . COPD (chronic obstructive pulmonary disease) (HCC)   . Diabetes mellitus   . Hypertension   . Peripheral vascular disease [facility])     Patient Active Problem List   Diagnosis Date Noted  . SIRS (systemic inflammatory response syndrome) (HCC) 01/04/2016  . COPD exacerbation (HCC) 01/04/2016  . Type 2 diabetes mellitus with vascular disease (HCC) 01/04/2016  . Lower extremity edema 01/04/2016  . Essential hypertension 08/22/2015  . Hyperlipidemia 08/22/2015  . Tobacco abuse 08/22/2015  . Atherosclerosis of native arteries of extremity with intermittent claudication (HCC) 06/22/2014  . PVD (peripheral vascular disease) (HCC) 03/31/2013  . Pain in limb-Bilateral leg 01/27/2013  . Peripheral vascular disease, unspecified (HCC) 01/27/2013  . Ischemic toe 12/23/2012  . Atherosclerosis of native arteries of the extremities with intermittent claudication 10/07/2012    Past Surgical History:  Procedure Laterality Date  . ABDOMINAL AORTAGRAM N/A 05/10/2013    Procedure: ABDOMINAL Ronny Flurry;  Surgeon: Chuck Hint, MD;  Location: [facility] CATH LAB;  Service: Cardiovascular;  Laterality: N/A;  . ANKLE SURGERY    . ILIAC ARTERY STENT    . LOWER EXTREMITY ANGIOGRAM Bilateral 05/10/2013   Procedure: LOWER EXTREMITY ANGIOGRAM;  Surgeon: Chuck Hint, MD;  Location: [facility] CATH LAB;  Service: Cardiovascular;  Laterality: Bilateral;    Prior to Admission medications   Medication Sig Start Date End Date Taking? Authorizing Provider  acetaminophen (TYLENOL) 500 MG tablet Take 1,000 mg by mouth every 6 (six) hours as needed for moderate pain.    [provider]  albuterol (PROVENTIL) (2.5 MG/3ML) 0.083% nebulizer solution Take 2.5 mg by nebulization every 6 (six) hours as needed for wheezing or shortness of breath. Reported on 10/31/2015    [provider]  albuterol (VENTOLIN HFA) 108 (90 Base) MCG/ACT inhaler Inhale 2 puffs into the lungs every 6 (six) hours as needed for wheezing or shortness of breath.    [provider]  aspirin EC 81 MG tablet Take 81 mg by mouth daily.      [provider]  atorvastatin (LIPITOR) 20 MG tablet Take 20 mg by mouth daily at 6 PM.     [provider]  clindamycin (CLEOCIN) 300 MG capsule Take 1 capsule (300 mg total) by mouth 3 (three) times daily. 03/23/17 04/02/17  Enid Derry, PA-C  dextromethorphan (DELSYM) 30 MG/5ML liquid Take 60 mg by mouth daily as needed for cough.     [provider]  docusate sodium (COLACE) 100 MG capsule Take 300 mg by mouth at bedtime.  [provider]  escitalopram (LEXAPRO) 10 MG tablet Take 10 mg by mouth daily. 11/18/15   [provider]  ferrous sulfate 325 (65 FE) MG EC tablet Take 325 mg by mouth daily.  09/26/15 01/04/16  [provider]  Fluticasone Furoate-Vilanterol (BREO ELLIPTA) 100-25 MCG/INH AEPB Inhale 1 puff into the lungs daily as needed (for shortness of breath).    [provider]  furosemide (LASIX) 40 MG tablet Take 40 mg by mouth.    [provider]  hydrOXYzine (ATARAX/VISTARIL) 10 MG tablet Take 1 tablet (10 mg total) by mouth 3 (three) times daily as needed. 03/23/17   Enid Derry, PA-C  ibuprofen (ADVIL,MOTRIN) 200 MG tablet Take 600 mg by mouth every 6 (six) hours as needed. For pain.    [provider]  lidocaine (LIDODERM) 5 % Place 2 patches onto the skin daily as needed (for pain). Remove & Discard patch within 12 hours or as directed by MD    [provider]  MAGNESIUM PO Take 1 tablet by mouth daily.    [provider]  Menthol, Topical Analgesic, 5 % PADS Apply 1 patch topically at bedtime as needed (for leg pain).     [provider]  Menthol-Methyl Salicylate (MUSCLE RUB EX) Use as directed 12/13/15   [provider]  predniSONE (DELTASONE) 10 MG tablet Take 2 tablets (20 mg total) by mouth daily. 03/23/17 03/28/17  Enid Derry, PA-C  Saxagliptin-Metformin (KOMBIGLYZE XR) 2.12-998 MG TB24 Take 1 tablet by mouth 2 (two) times daily.    [provider]  topiramate (TOPAMAX) 100 MG tablet Take 100 mg by mouth 2 (two) times daily.    [provider]  traMADol (ULTRAM) 50 MG tablet Take 50 mg by mouth every 6 (six) hours as needed. For pain    [provider]  triamcinolone cream (KENALOG) 0.1 % Apply 1 application topically daily as needed.  11/17/15   [provider]  vitamin B-12 (CYANOCOBALAMIN) 1000 MCG tablet Take 1,000 mcg by mouth daily.    [provider]    Allergies Chantix [varenicline]; Latex; Metformin and related; Silver; Wheat bran; Bean pod extract; and Tape  Family History  Problem Relation Age of Onset  . Diabetes Mother   . Diabetes Sister   . Cancer Brother   . Diabetes Brother     Social History Social History  Substance Use Topics  . Smoking status: Current Every Day Smoker    Packs/day: 2.00    Years: 45.00    Types:  Cigarettes  . Smokeless tobacco: Never Used  . Alcohol use No     Review of Systems  Constitutional: No fever/chills Cardiovascular: No chest pain. Respiratory:  No SOB. Gastrointestinal: No abdominal pain.  No nausea, no vomiting.  Musculoskeletal: Negative for musculoskeletal pain. Neurological: Negative for headaches, numbness or tingling   ____________________________________________   PHYSICAL EXAM:  VITAL SIGNS: ED Triage Vitals  Enc Vitals Group     BP 03/23/17 1935 114/66     Pulse Rate 03/23/17 1935 (!) 108     Resp 03/23/17 1935 20     Temp 03/23/17 1935 98.7 F (37.1 C)     Temp Source 03/23/17 1935 Oral     SpO2 03/23/17 1935 98 %     Weight 03/23/17 1934 178 lb (80.7 kg)     Height 03/23/17 1934 6' (1.829 m)     Head Circumference --      Peak Flow --  Pain Score --      Pain Loc --      Pain Edu? --      Excl. in GC? --      Constitutional: Alert and oriented. Well appearing and in no acute distress. Eyes: Conjunctivae are normal. PERRL. EOMI. Head: Atraumatic. ENT:      Ears:      Nose: No congestion/rhinnorhea.      Mouth/Throat: Mucous membranes are moist.  Neck: No stridor. Cardiovascular: Normal rate, regular rhythm.  Good peripheral circulation. Respiratory: Normal respiratory effort without tachypnea or retractions. Lungs CTAB. Good air entry to the bases with no decreased or absent breath sounds. Musculoskeletal: Full range of motion to all extremities. No gross deformities appreciated. Neurologic:  Normal speech and language. No gross focal neurologic deficits are appreciated.  Skin:  Skin is warm, dry. 1 inch shear abrasion to left forearm are with mild surrounding erythema. No purulent drainage. No necrosis or eschar. No malodor.   ____________________________________________   LABS (all labs ordered are listed, but only abnormal results are displayed)  Labs Reviewed  GLUCOSE, CAPILLARY - Abnormal; Notable for the following:        Result Value   Glucose-Capillary 121 (*)    All other components within normal limits  CBG MONITORING, ED   ____________________________________________  EKG   ____________________________________________  RADIOLOGY  No results found.  ____________________________________________    PROCEDURES  Procedure(s) performed:    Procedures    Medications  Tdap (BOOSTRIX) injection 0.5 mL (0.5 mLs Intramuscular Given 03/23/17 2217)  clindamycin (CLEOCIN) capsule 300 mg (300 mg Oral Given 03/23/17 2303)  hydrOXYzine (ATARAX/VISTARIL) tablet 25 mg (25 mg Oral Given 03/23/17 2302)  predniSONE (DELTASONE) tablet 50 mg (50 mg Oral Given 03/23/17 2302)     ____________________________________________   INITIAL IMPRESSION / ASSESSMENT AND PLAN / ED COURSE  Pertinent labs & imaging results that were available during my care of the patient were reviewed by me and considered in my medical decision making (see chart for details).  Review of the [place] CSRS was performed in accordance of the [facility] prior to dispensing any controlled drugs.     Patient presented department for evaluation of the wound. Vital signs and exam are reassuring. He is afebrile. Wound does not appear to be infected at this time but there is some mild surrounding erythema. He was given a low dose prednisone and hydroxyzine for pruritis. Tetanus shot was updated. Patient will be discharged home with prescriptions for clindamycin, hydroxazine, prednisone. Patient is to follow up with wound care as directed. Patient is given ED precautions to return to the ED for any worsening or new symptoms.     ____________________________________________  FINAL CLINICAL IMPRESSION(S) / ED DIAGNOSES  Final diagnoses:  Open wound of left upper extremity, initial encounter      NEW MEDICATIONS STARTED DURING THIS VISIT:  Discharge Medication List as of 03/23/2017 10:54 PM    START taking these medications   Details   clindamycin (CLEOCIN) 300 MG capsule Take 1 capsule (300 mg total) by mouth 3 (three) times daily., Starting Sun 03/23/2017, Until Wed 04/02/2017, Print    hydrOXYzine (ATARAX/VISTARIL) 10 MG tablet Take 1 tablet (10 mg total) by mouth 3 (three) times daily as needed., Starting Sun 03/23/2017, Print    predniSONE (DELTASONE) 10 MG tablet Take 2 tablets (20 mg total) by mouth daily., Starting Sun 03/23/2017, Until Fri 03/28/2017, Print            This chart was  dictated using voice recognition software/Dragon. Despite best efforts to proofread, errors can occur which can change the meaning. Any change was purely unintentional.    Enid Derry, PA-C 03/24/17 2311    Dionne Bucy, MD 03/31/17 [phone]

## 2017-04-14 DIAGNOSIS — H0011 Chalazion right upper eyelid: Secondary | ICD-10-CM | POA: Insufficient documentation

## 2017-04-14 DIAGNOSIS — E113393 Type 2 diabetes mellitus with moderate nonproliferative diabetic retinopathy without macular edema, bilateral: Secondary | ICD-10-CM | POA: Insufficient documentation

## 2017-04-14 DIAGNOSIS — H5203 Hypermetropia, bilateral: Secondary | ICD-10-CM | POA: Insufficient documentation

## 2017-06-04 DIAGNOSIS — G629 Polyneuropathy, unspecified: Secondary | ICD-10-CM | POA: Insufficient documentation

## 2017-06-04 DIAGNOSIS — M65341 Trigger finger, right ring finger: Secondary | ICD-10-CM | POA: Insufficient documentation

## 2017-06-04 DIAGNOSIS — I639 Cerebral infarction, unspecified: Secondary | ICD-10-CM | POA: Insufficient documentation

## 2017-06-16 DIAGNOSIS — L988 Other specified disorders of the skin and subcutaneous tissue: Secondary | ICD-10-CM | POA: Insufficient documentation

## 2017-06-17 ENCOUNTER — Ambulatory Visit: Payer: Medicare HMO | Admitting: Podiatry

## 2017-06-17 ENCOUNTER — Encounter: Payer: Self-pay | Admitting: Podiatry

## 2017-06-17 DIAGNOSIS — E1140 Type 2 diabetes mellitus with diabetic neuropathy, unspecified: Secondary | ICD-10-CM

## 2017-06-17 DIAGNOSIS — B351 Tinea unguium: Secondary | ICD-10-CM | POA: Diagnosis not present

## 2017-06-17 DIAGNOSIS — E1151 Type 2 diabetes mellitus with diabetic peripheral angiopathy without gangrene: Secondary | ICD-10-CM | POA: Diagnosis not present

## 2017-06-17 DIAGNOSIS — M79674 Pain in right toe(s): Secondary | ICD-10-CM

## 2017-06-17 NOTE — Progress Notes (Signed)
Patient ID: [name], male   DOB: 09/02/1955, 61 y.o.   MRN: [id number] HPI  Complaint:  Visit Type: Patient returns to my office for continued preventative foot care services. Complaint: Patient states" my nails have grown long and thick and become painful to walk and wear shoes" Patient has been diagnosed with DM with vascular and neuropathy. Marland Kitchen. He presents for preventative foot care services. No changes to ROS  Podiatric Exam: Vascular: dorsalis pedis and posterior tibial pulses are negative. Capillary return is immediate. Temperature gradient is negative. Skin turgor WNL,   Sensorium: Absent Semmes Weinstein monofilament test.   Nail Exam: Pt has thick disfigured discolored nails with subungual debris noted bilateral entire nail hallux through fifth toenails Ulcer Exam: There is no evidence of ulcer or pre-ulcerative changes or infection. Orthopedic Exam: Muscle tone and strength are WNL. No limitations in general ROM. No crepitus or effusions noted. Foot type and digits show no abnormalities. Bony prominences are unremarkable. Skin: No Porokeratosis.  No foot ulcers.  Diagnosis:  Tinea unguium, Pain in right toe, pain in left toes  Treatment & Plan Procedures and Treatment: Consent by patient was obtained for treatment procedures. The patient understood the discussion of treatment and procedures well. All questions were answered thoroughly reviewed. Debridement of mycotic and hypertrophic toenails, 1 through 5 bilateral and clearing of subungual debris. Patient to receive his diabetic shoes today. Return Visit-Office Procedure: Patient instructed to return to the office for a follow up visit   4 months for continued evaluation and treatment.   Helane GuntherGregory Tieshia Rettinger DPM

## 2017-10-08 ENCOUNTER — Ambulatory Visit: Payer: Medicare HMO | Admitting: Vascular Surgery

## 2017-10-08 ENCOUNTER — Encounter (HOSPITAL_COMMUNITY): Payer: Self-pay

## 2017-10-14 ENCOUNTER — Ambulatory Visit: Payer: Medicare HMO | Admitting: Podiatry

## 2017-10-15 ENCOUNTER — Ambulatory Visit (INDEPENDENT_AMBULATORY_CARE_PROVIDER_SITE_OTHER): Payer: Medicare HMO

## 2017-10-15 ENCOUNTER — Ambulatory Visit: Payer: Medicare HMO | Admitting: Podiatry

## 2017-10-15 ENCOUNTER — Encounter: Payer: Self-pay | Admitting: Podiatry

## 2017-10-15 DIAGNOSIS — T1490XA Injury, unspecified, initial encounter: Secondary | ICD-10-CM

## 2017-10-15 DIAGNOSIS — M79674 Pain in right toe(s): Secondary | ICD-10-CM

## 2017-10-15 DIAGNOSIS — E1140 Type 2 diabetes mellitus with diabetic neuropathy, unspecified: Secondary | ICD-10-CM | POA: Diagnosis not present

## 2017-10-15 DIAGNOSIS — B351 Tinea unguium: Secondary | ICD-10-CM

## 2017-10-15 DIAGNOSIS — M79676 Pain in unspecified toe(s): Secondary | ICD-10-CM | POA: Diagnosis not present

## 2017-10-15 DIAGNOSIS — M84374A Stress fracture, right foot, initial encounter for fracture: Secondary | ICD-10-CM | POA: Diagnosis not present

## 2017-10-15 NOTE — Progress Notes (Addendum)
Patient ID: [name], male   DOB: 01/15/1956, 62 y.o.   MRN: [id number] HPI  Complaint:  Visit Type: Patient returns to my office for continued preventative foot care services. Complaint: Patient states" my nails have grown long and thick and become painful to walk and wear shoes" Patient has been diagnosed with DM with vascular and neuropathy. Marland Kitchen. He presents for preventative foot care services. No changes to ROS.  Marland Kitchen. He also says that he is having pain in his right forefoot for the last 2 weeks.  He denies any history of trauma or injury to the foot. Marland Kitchen. He says his right foot has been more swollen than what he presents to the office today.  He requests that this swollen, painful right foot be evaluated.   Podiatric Exam: Vascular: dorsalis pedis and posterior tibial pulses are negative. Capillary return is immediate. Temperature gradient is negative. Skin turgor WNL,   Sensorium: Absent Semmes Weinstein monofilament test.   Nail Exam: Pt has thick disfigured discolored nails with subungual debris noted bilateral entire nail hallux through fifth toenails Ulcer Exam: There is no evidence of ulcer or pre-ulcerative changes or infection. Orthopedic Exam: Muscle tone and strength are WNL. No limitations in general ROM. No crepitus or effusions noted. Foot type and digits show no abnormalities. . There is swelling and pain noted upon palpation of the first through third metatarsals right foot.   Skin: No Porokeratosis.  No foot ulcers.  Diagnosis:  Tinea unguium, Pain in right toe, pain in left toes,  Possible stress fracture right foot.  Treatment & Plan Procedures and Treatment: Consent by patient was obtained for treatment procedures. The patient understood the discussion of treatment and procedures well. All questions were answered thoroughly reviewed. Debridement of mycotic and hypertrophic toenails, 1 through 5 bilateral and clearing of subungual debris. . X-rays taken reveal no evidence of  any bony pathology to the metatarsals on the right foot.  There does seem to be callus on the lateral base of the second metatarsal. Patient has his palpable pain noted of the metatarsals 1, 2, 3   right foot.. Discussed this his patient and we decided to treat him as an early stress  Fracture.  . Patient was dispensed a cam walker when leaving.  The CMAs remarked that he was walking much better as he was leaving the office.  RTC 4 weeks for evaluation of his right forefoot pain.  Return to the clinic in 4 months for preventative foot care services Return Visit-Office Procedure: Patient instructed to return to the office for a follow up visit   4 months for continued evaluation and treatment.   Helane GuntherGregory Ilia Engelbert DPM

## 2017-11-05 ENCOUNTER — Encounter: Payer: Self-pay | Admitting: Physician Assistant

## 2017-11-05 ENCOUNTER — Ambulatory Visit: Payer: Medicare HMO | Admitting: Physician Assistant

## 2017-11-05 ENCOUNTER — Ambulatory Visit (HOSPITAL_COMMUNITY)
Admission: RE | Admit: 2017-11-05 | Discharge: 2017-11-05 | Disposition: A | Discharge status: Home / Self Care | Payer: Medicare HMO | Source: Ambulatory Visit | Attending: Vascular Surgery | Admitting: Vascular Surgery

## 2017-11-05 VITALS — BP 80/50 | HR 106 | Temp 97.6°F | Resp 20 | Ht 72.0 in | Wt 202.2 lb

## 2017-11-05 DIAGNOSIS — Z720 Tobacco use: Secondary | ICD-10-CM

## 2017-11-05 DIAGNOSIS — I739 Peripheral vascular disease, unspecified: Secondary | ICD-10-CM

## 2017-11-05 DIAGNOSIS — I70213 Atherosclerosis of native arteries of extremities with intermittent claudication, bilateral legs: Secondary | ICD-10-CM

## 2017-11-05 NOTE — Progress Notes (Signed)
Established Intermittent Claudication   History of Present Illness   Alejandro Jones is a 63 y.o. ([birth year]) male who presents with chief complaint: claudication of BLE L>R.  Surgical history significant for prior left iliac artery stenting done in [place] which are known to be occluded.  The patient's symptoms have remained consistent over the past year.  The patient's symptoms are: claudication of L thigh and calf as well as claudication of R calf with ambulation.  He admittedly has not been walking much due to a stress fracture in his right foot which has required a walking boot.  Patient states he has pain in his hamstrings and thighs that wakes him up in the middle the night however believes this is associated with his herniated disks and history of back pain.  He denies any rest pain in his left foot.  He also denies any nonhealing wounds or ulcerations of his left foot.  He continues to take an aspirin and a statin daily.  He continues to smoke at least one pack of cigarettes per day and has been smoking at least a pack for more than 50 years.  Past medical history is also significant for congestive heart failure as well as COPD.  The patient's treatment regimen currently included: maximal medical management and walking plan.  The patient's PMH, PSH, SH, and FamHx are unchanged from prior office visit.  Current Outpatient Medications  Medication Sig Dispense Refill  . albuterol (PROVENTIL) (2.5 MG/3ML) 0.083% nebulizer solution Take 2.5 mg by nebulization every 6 (six) hours as needed for wheezing or shortness of breath. Reported on 10/31/2015    . albuterol (VENTOLIN HFA) 108 (90 Base) MCG/ACT inhaler Inhale 2 puffs into the lungs every 6 (six) hours as needed for wheezing or shortness of breath.    Marland Kitchen aspirin EC 81 MG tablet Take 81 mg by mouth daily.      Marland Kitchen atorvastatin (LIPITOR) 20 MG tablet Take 20 mg by mouth daily at 6 PM.     . dextromethorphan (DELSYM) 30 MG/5ML liquid Take 60  mg by mouth daily as needed for cough.     . docusate sodium (COLACE) 100 MG capsule Take 300 mg by mouth at bedtime.     Marland Kitchen escitalopram (LEXAPRO) 10 MG tablet Take 10 mg by mouth daily.    . Fluticasone Furoate-Vilanterol (BREO ELLIPTA) 100-25 MCG/INH AEPB Inhale 1 puff into the lungs daily as needed (for shortness of breath).    . furosemide (LASIX) 40 MG tablet Take 40 mg by mouth.    . gabapentin (NEURONTIN) 300 MG capsule     . lidocaine (LIDODERM) 5 % Place 2 patches onto the skin daily as needed (for pain). Remove & Discard patch within 12 hours or as directed by MD    . losartan (COZAAR) 50 MG tablet     . MAGNESIUM PO Take 1 tablet by mouth daily.    . Menthol, Topical Analgesic, 5 % PADS Apply 1 patch topically at bedtime as needed (for leg pain).     . Menthol-Methyl Salicylate (MUSCLE RUB EX) Use as directed    . Saxagliptin-Metformin (KOMBIGLYZE XR) 2.12-998 MG TB24 Take 1 tablet by mouth 2 (two) times daily.    Marland Kitchen thioridazine (MELLARIL) 10 MG tablet Take 3 tablets by mouth at bedtime    . topiramate (TOPAMAX) 100 MG tablet Take 100 mg by mouth 2 (two) times daily.    . traMADol (ULTRAM) 50 MG tablet Take 50 mg by mouth every  6 (six) hours as needed. For pain    . triamcinolone cream (KENALOG) 0.1 % Apply 1 application topically daily as needed.     . vitamin B-12 (CYANOCOBALAMIN) 1000 MCG tablet Take 1,000 mcg by mouth daily.    Marland Kitchen. acetaminophen (TYLENOL) 500 MG tablet Take 1,000 mg by mouth every 6 (six) hours as needed for moderate pain.    . ferrous sulfate 325 (65 FE) MG EC tablet Take 325 mg by mouth daily.     . hydrOXYzine (ATARAX/VISTARIL) 10 MG tablet Take 1 tablet (10 mg total) by mouth 3 (three) times daily as needed. (Patient not taking: Reported on 11/05/2017) 30 tablet 0  . ibuprofen (ADVIL,MOTRIN) 200 MG tablet Take 600 mg by mouth every 6 (six) hours as needed. For pain.     No current facility-administered medications for this visit.     On ROS today: negative  unless otherwise noted in HPI   Physical Examination   Vitals:   11/05/17 1610  BP: (!) 80/50  Pulse: (!) 106  Resp: 20  Temp: 97.6 F (36.4 C)  TempSrc: Oral  SpO2: 97%  Weight: 202 lb 3.2 oz (91.7 kg)  Height: 6' (1.829 m)   Body mass index is 27.42 kg/m.  General Alert, O x 3, WD, NAD  Pulmonary Sym exp, good B air movt, wheezing on Expiration and bilateral lung fields  Cardiac RRR, Nl S1, S2,   Vascular Vessel Right Left  Radial Palpable Palpable  Brachial Palpable Palpable  Carotid Palpable, No Bruit Palpable, No Bruit  Aorta Not palpable N/A  Femoral Palpable Palpable  Popliteal Not palpable Not palpable  PT Faintly palpable Not palpable  DP Not palpable Not palpable    Gastro- intestinal soft, non-distended, non-tender to palpation, No guarding or rebound,   Musculo- skeletal M/S 5/5 throughout  , Extremities without ischemic changes  , No edema present, No visible varicosities , No Lipodermatosclerosis present unable to palpate pedal pulses however her feet are warm to touch with good capillary refill; no wounds or ulcerations bilateral feet  Neurologic Motor exam as listed above    Non-Invasive Vascular imaging   ABI (11/05/17)  R:   ABI: 0.85   PT: tri  DP: mono  TBI:  0.99  L:   ABI: 0.59   PT: mono  DP: mono  TBI: 0.67    Medical Decision Making   Alejandro Jones is a 62 y.o. male who presents with:  bilateral leg intermittent claudication without evidence of critical limb ischemia.   Known left iliac artery occlusion  Claudication symptoms over the past year have remained stable  Due to past medical history including heart failure, COPD, and ongoing tobacco abuse patient is not a good surgical candidate  No rest pain or tissue loss noted  Encouraged tobacco cessation, walking program, and to continue aspirin and statin regimen  Follow-up in 1 year with ABIs  Return sooner if rest pain or tissue loss develops   Emilie RutterMatthew  Cherylee Rawlinson, PA-C [facility] [place] Office: [phone]

## 2017-11-12 ENCOUNTER — Ambulatory Visit (INDEPENDENT_AMBULATORY_CARE_PROVIDER_SITE_OTHER): Payer: Medicare HMO

## 2017-11-12 ENCOUNTER — Encounter: Payer: Self-pay | Admitting: Podiatry

## 2017-11-12 ENCOUNTER — Ambulatory Visit: Payer: Medicare HMO | Admitting: Podiatry

## 2017-11-12 DIAGNOSIS — T148XXA Other injury of unspecified body region, initial encounter: Secondary | ICD-10-CM | POA: Diagnosis not present

## 2017-11-12 DIAGNOSIS — M84374A Stress fracture, right foot, initial encounter for fracture: Secondary | ICD-10-CM | POA: Diagnosis not present

## 2017-11-12 DIAGNOSIS — E1140 Type 2 diabetes mellitus with diabetic neuropathy, unspecified: Secondary | ICD-10-CM | POA: Diagnosis not present

## 2017-11-13 NOTE — Progress Notes (Signed)
This patient returns to the office with a diagnosis of a possible stress fracture of the right foot.  Patient was seen in this office for weeks ago and treated with a cam walker.  He says he has faithfully been wearing his Cam Walker when he walks since his last visit.  Patient states his right foot has significantly improved and the swelling has gone down.  He presents the office today for continued evaluation and treatment of hisright foot.  General Appearance  Alert, conversant and in no acute stress.  Vascular  Dorsalis pedis and posterior tibial  pulses are palpable  bilaterally.  Capillary return is within normal limits  bilaterally. Temperature is within normal limits  bilaterally.  Neurologic  Senn-Weinstein monofilament wire test absent   bilaterally. Muscle power within normal limits bilaterally.  Nails Thick disfigured discolored nails with subungual debris  from hallux to fifth toes bilaterally. No evidence of bacterial infection or drainage bilaterally.  Orthopedic  No limitations of motion of motion feet .  No crepitus or effusions noted.  Examination of his right foot reveals no evidence of swelling and no evidence of increased temperature.  No palpable pain noted through the first through the third metatarsals of the right foot.  Skin  normotropic skin with no porokeratosis noted bilaterally.  No signs of infections or ulcers noted.    Foot Sprain right foot.  ROV.  -rays taken reveal no evidence of any bony pathology to the metatarsals of the right foot. Patient has minimal pain and discomfort upon evaluation and palpation of the right foot.  Patient was told to return to his regular footgear, but to keep the Cam Walker around if the problem persists.  Patient was told to return to the office in  8 weeks for preventative foot care services.   Helane GuntherGregory Elbie Statzer DPM

## 2017-12-31 DIAGNOSIS — R269 Unspecified abnormalities of gait and mobility: Secondary | ICD-10-CM | POA: Insufficient documentation

## 2018-01-07 ENCOUNTER — Ambulatory Visit: Payer: Medicare HMO | Admitting: Podiatry

## 2018-02-17 ENCOUNTER — Encounter: Payer: Self-pay | Admitting: Podiatry

## 2018-02-17 ENCOUNTER — Ambulatory Visit: Payer: Medicare HMO | Admitting: Podiatry

## 2018-02-17 DIAGNOSIS — E1142 Type 2 diabetes mellitus with diabetic polyneuropathy: Secondary | ICD-10-CM

## 2018-02-17 DIAGNOSIS — E1151 Type 2 diabetes mellitus with diabetic peripheral angiopathy without gangrene: Secondary | ICD-10-CM | POA: Diagnosis not present

## 2018-02-17 DIAGNOSIS — M79674 Pain in right toe(s): Secondary | ICD-10-CM

## 2018-02-17 DIAGNOSIS — B351 Tinea unguium: Secondary | ICD-10-CM | POA: Diagnosis not present

## 2018-02-17 NOTE — Progress Notes (Signed)
Patient ID: Alejandro Jones, male   DOB: 03/14/1956, 62 y.o.   MRN: [id number] HPI  Complaint:  Visit Type: Patient returns to my office for continued preventative foot care services. Complaint: Patient states" my nails have grown long and thick and become painful to walk and wear shoes" Patient has been diagnosed with DM with vascular and neuropathy. . He presents for preventative foot care services. No changes to ROS  Podiatric Exam: Vascular: dorsalis pedis and posterior tibial pulses are negative. Capillary return is immediate. Temperature gradient is negative. Skin turgor WNL,   Sensorium: Absent Semmes Weinstein monofilament test.   Nail Exam: Pt has thick disfigured discolored nails with subungual debris noted bilateral entire nail hallux through fifth toenails Ulcer Exam: There is no evidence of ulcer or pre-ulcerative changes or infection. Orthopedic Exam: Muscle tone and strength are WNL. No limitations in general ROM. No crepitus or effusions noted. Foot type and digits show no abnormalities. Bony prominences are unremarkable. Skin: No Porokeratosis.  No foot ulcers.  Diagnosis:  Tinea unguium, Pain in right toe, pain in left toes  Treatment & Plan Procedures and Treatment: Consent by patient was obtained for treatment procedures. The patient understood the discussion of treatment and procedures well. All questions were answered thoroughly reviewed. Debridement of mycotic and hypertrophic toenails, 1 through 5 bilateral and clearing of subungual debris. Patient qualifies for diabetic shoes with angiopathy and neuropathy. Return Visit-Office Procedure: Patient instructed to return to the office for a follow up visit   3 months for continued evaluation and treatment.   Yun Gutierrez DPM  

## 2018-03-31 ENCOUNTER — Ambulatory Visit: Payer: Medicare HMO | Admitting: Orthotics

## 2018-03-31 DIAGNOSIS — E1142 Type 2 diabetes mellitus with diabetic polyneuropathy: Secondary | ICD-10-CM

## 2018-03-31 DIAGNOSIS — M84374A Stress fracture, right foot, initial encounter for fracture: Secondary | ICD-10-CM

## 2018-03-31 DIAGNOSIS — E1151 Type 2 diabetes mellitus with diabetic peripheral angiopathy without gangrene: Secondary | ICD-10-CM

## 2018-05-18 IMAGING — CT CT HEAD W/O CM
5 of 8 series · 17 of 47 positions shown, 18 images · non-contrast
Comparison: October 05, 2015

CLINICAL DATA: Pain after fall

EXAM:
CT HEAD WITHOUT CONTRAST
CT CERVICAL SPINE WITHOUT CONTRAST
TECHNIQUE: Multidetector CT imaging of the head and cervical spine was
performed following the standard protocol without intravenous
contrast. Multiplanar CT image reconstructions of the cervical spine
were also generated.

[Series 2: head without · axial · non-contrast · 0.42mm/px · z∈[-166,-6]mm · 3 of 33 slices shown, 4 images]
[im 1/33  brain]
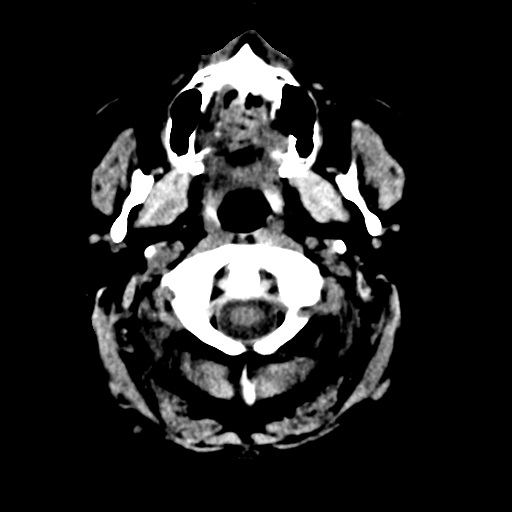
[im 1/33  bone]
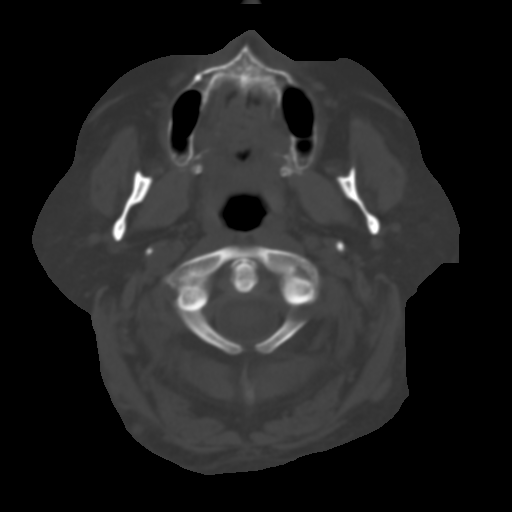
[im 17/33  brain]
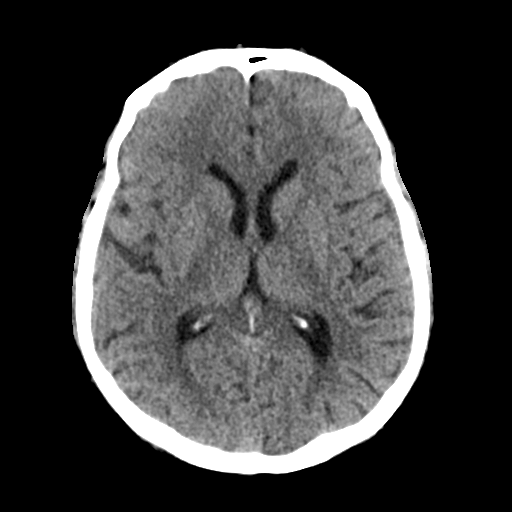
[im 33/33  brain]
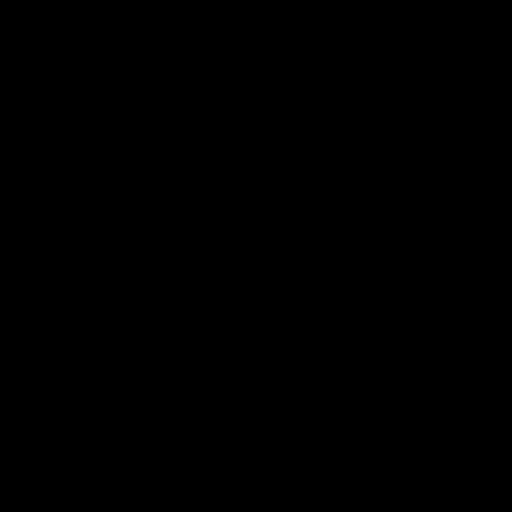

[Series 4: head bone · axial · 0.42mm/px · z∈[-144,-26]mm · 6 of 83 slices shown]
[im 12/83  bone]
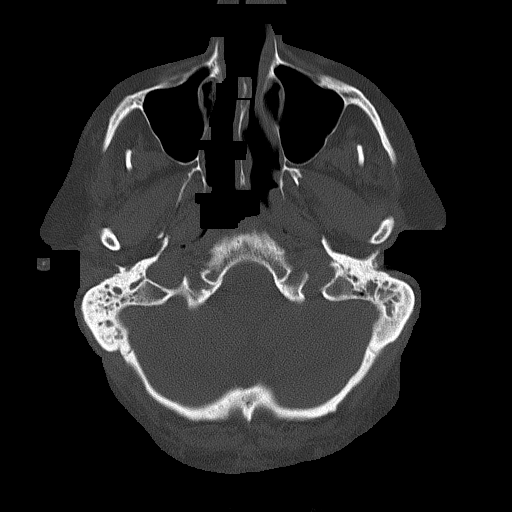
[im 24/83  bone]
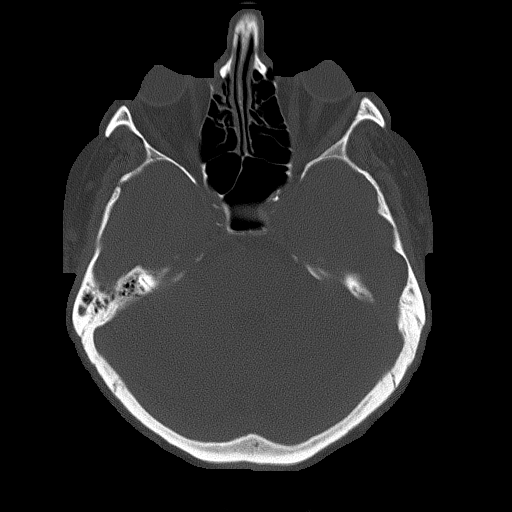
[im 36/83  bone]
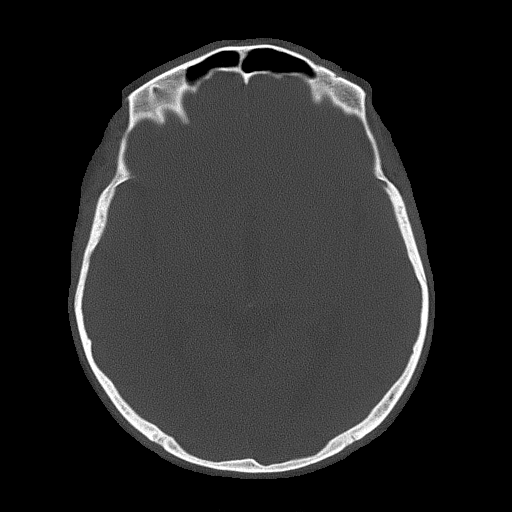
[im 47/83  bone]
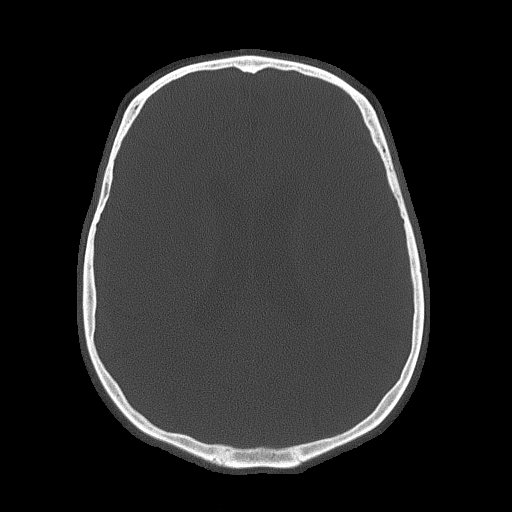
[im 59/83  bone]
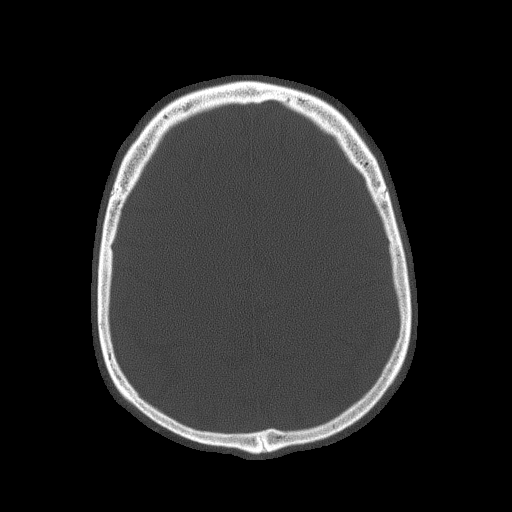
[im 71/83  bone]
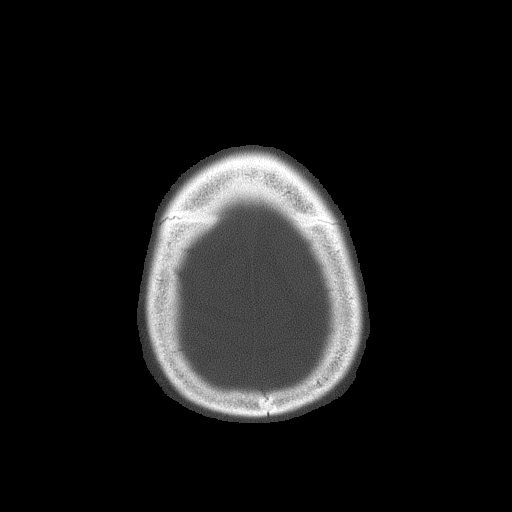

[Series 5: head without cor · coronal · non-contrast · 0.34mm/px · 3 of 68 slices shown]
[im 14/68  brain]
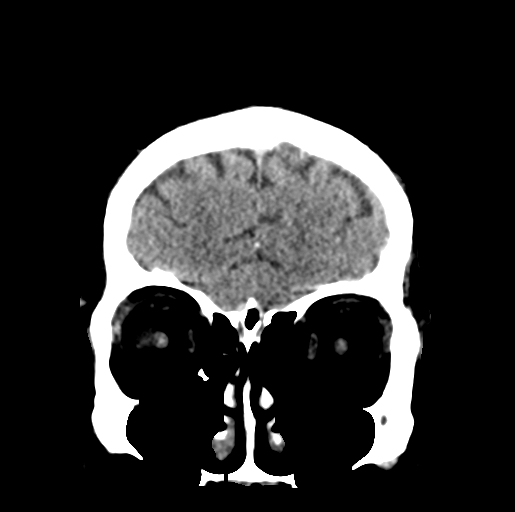
[im 27/68  brain]
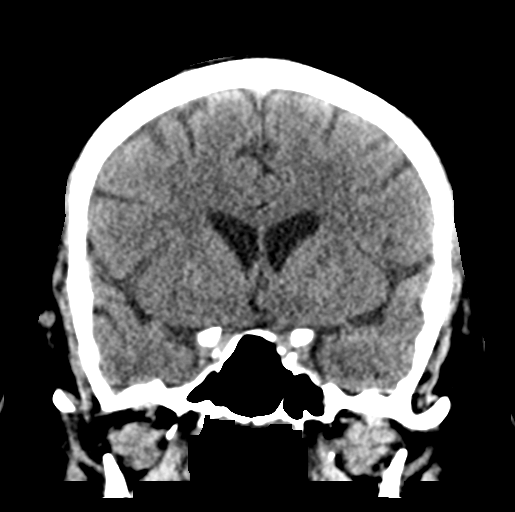
[im 41/68  brain]
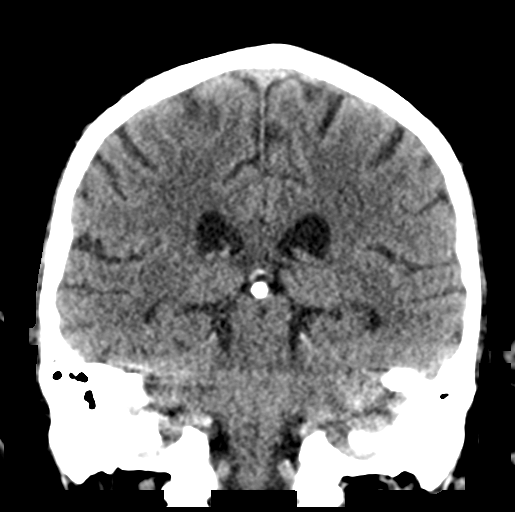

[Series 6: head without sag · sagittal · non-contrast · 0.32mm/px · 2 of 62 slices shown]
[im 21/62  brain]
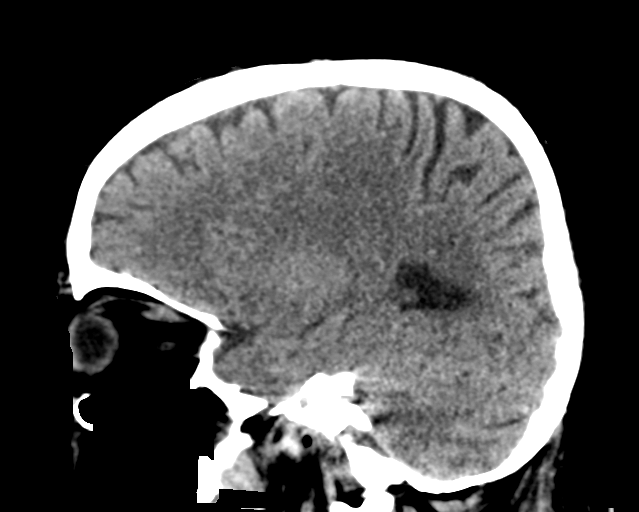
[im 41/62  brain]
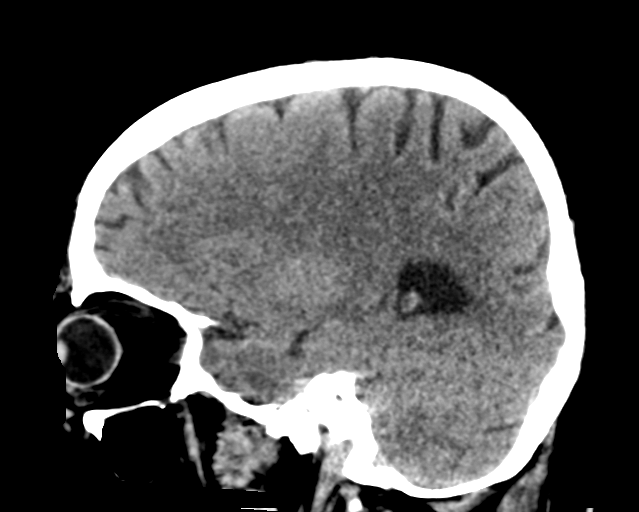

[Series 7: c_spine 2.0 st · axial · 0.39mm/px · z∈[-312,-262]mm · 3 of 102 slices shown]
[im 13/102  brain]
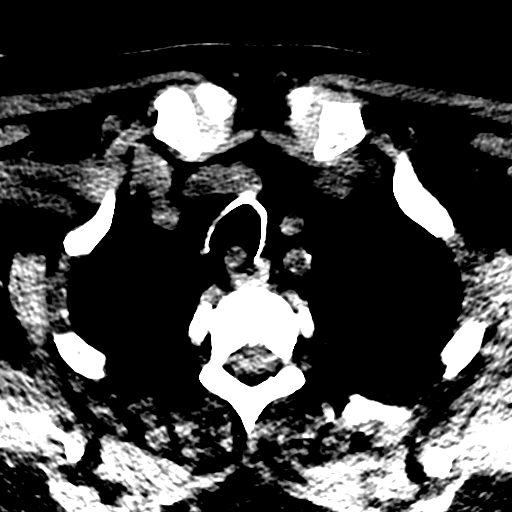
[im 26/102  brain]
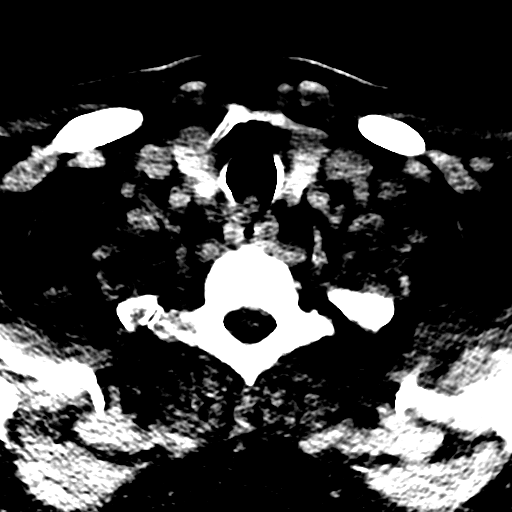
[im 38/102  brain]
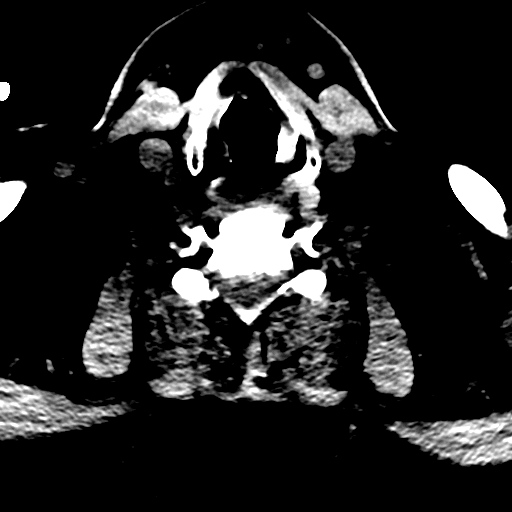

[17 of 47 positions shown; findings below may reference images not displayed]

FINDINGS: CT HEAD FINDINGS

Opacification of the lower mastoid air cells is again seen, also
present in Sunday October, 2015 with no associated erosion or overlying
soft tissue abnormalities. The middle ears are well aerated.
Visualized bones are normal. Extracranial soft tissues are
unremarkable. Probable sebaceous cyst in the left side of the scalp
on image 12, unchanged. No subdural, epidural, or subarachnoid
hemorrhage is identified. The cerebellum, brainstem, and basal
cisterns are normal. No acute cortical ischemia or infarct. No mass,
mass effect, or midline shift. Ventricles and sulci are unremarkable
for age.

CT CERVICAL SPINE FINDINGS

Evaluation of the cervical spine is somewhat limited due to mild
motion. Images are also noisey and limited below the level of C5.
There is reversal of normal lordosis centered at C5-6, unchanged.
The posterior arch of C1 is not completely fused, consistent with a
congenital variant. No acute fractures are seen. Degenerative
changes seen most prominent CT C5-6. A posterior disc osteophyte
complex at C5-6 narrows the canal to 9 mm, unchanged in the
interval. No other soft tissue abnormalities.
IMPRESSION: 1. No acute intracranial process.
2. Persistent and stable opacification of mastoid air cells without
erosion or overlying soft tissue abnormality.
3. No fracture or malalignment seen in the cervical spine. Images
are somewhat limited below the level of C5 as described above.

## 2018-06-16 ENCOUNTER — Ambulatory Visit: Payer: Medicare HMO | Admitting: Podiatry

## 2018-08-14 ENCOUNTER — Ambulatory Visit: Payer: Medicare HMO | Admitting: Podiatry

## 2018-08-14 ENCOUNTER — Encounter: Payer: Self-pay | Admitting: Podiatry

## 2018-08-14 DIAGNOSIS — E1151 Type 2 diabetes mellitus with diabetic peripheral angiopathy without gangrene: Secondary | ICD-10-CM

## 2018-08-14 DIAGNOSIS — M79674 Pain in right toe(s): Secondary | ICD-10-CM

## 2018-08-14 DIAGNOSIS — B351 Tinea unguium: Secondary | ICD-10-CM

## 2018-08-14 NOTE — Progress Notes (Signed)
Patient ID: Alejandro Jones, male   DOB: 01/11/1956, 63 y.o.   MRN: [id number] HPI  Complaint:  Visit Type: Patient returns to my office for continued preventative foot care services. Complaint: Patient states" my nails have grown long and thick and become painful to walk and wear shoes" Patient has been diagnosed with DM with vascular and neuropathy. Marland Kitchen. He presents for preventative foot care services. No changes to ROS  Podiatric Exam: Vascular: dorsalis pedis and posterior tibial pulses are negative. Capillary return is immediate. Temperature gradient is negative. Skin turgor WNL,   Sensorium: Absent Semmes Weinstein monofilament test.   Nail Exam: Pt has thick disfigured discolored nails with subungual debris noted bilateral entire nail hallux through fifth toenails Ulcer Exam: There is no evidence of ulcer or pre-ulcerative changes or infection. Orthopedic Exam: Muscle tone and strength are WNL. No limitations in general ROM. No crepitus or effusions noted. Foot type and digits show no abnormalities. Bony prominences are unremarkable. Skin: No Porokeratosis.  No foot ulcers.  Diagnosis:  Tinea unguium, Pain in right toe, pain in left toes  Treatment & Plan Procedures and Treatment: Consent by patient was obtained for treatment procedures. The patient understood the discussion of treatment and procedures well. All questions were answered thoroughly reviewed. Debridement of mycotic and hypertrophic toenails, 1 through 5 bilateral and clearing of subungual debris. Patient qualifies for diabetic shoes with angiopathy and neuropathy. Return Visit-Office Procedure: Patient instructed to return to the office for a follow up visit   3 months for continued evaluation and treatment.   Alejandro Jones DPM

## 2018-09-17 DIAGNOSIS — J929 Pleural plaque without asbestos: Secondary | ICD-10-CM | POA: Insufficient documentation

## 2018-10-23 DIAGNOSIS — M79604 Pain in right leg: Secondary | ICD-10-CM | POA: Insufficient documentation

## 2018-12-15 ENCOUNTER — Encounter: Payer: Self-pay | Admitting: Podiatry

## 2018-12-15 ENCOUNTER — Other Ambulatory Visit: Payer: Self-pay

## 2018-12-15 ENCOUNTER — Ambulatory Visit: Payer: Medicare HMO | Admitting: Podiatry

## 2018-12-15 VITALS — Temp 97.7°F

## 2018-12-15 DIAGNOSIS — F329 Major depressive disorder, single episode, unspecified: Secondary | ICD-10-CM | POA: Insufficient documentation

## 2018-12-15 DIAGNOSIS — M79676 Pain in unspecified toe(s): Secondary | ICD-10-CM | POA: Diagnosis not present

## 2018-12-15 DIAGNOSIS — B351 Tinea unguium: Secondary | ICD-10-CM

## 2018-12-15 DIAGNOSIS — M1990 Unspecified osteoarthritis, unspecified site: Secondary | ICD-10-CM | POA: Insufficient documentation

## 2018-12-15 DIAGNOSIS — F32A Depression, unspecified: Secondary | ICD-10-CM | POA: Insufficient documentation

## 2018-12-15 DIAGNOSIS — E1151 Type 2 diabetes mellitus with diabetic peripheral angiopathy without gangrene: Secondary | ICD-10-CM

## 2018-12-15 DIAGNOSIS — H409 Unspecified glaucoma: Secondary | ICD-10-CM | POA: Insufficient documentation

## 2018-12-15 DIAGNOSIS — G4733 Obstructive sleep apnea (adult) (pediatric): Secondary | ICD-10-CM | POA: Insufficient documentation

## 2018-12-15 DIAGNOSIS — K219 Gastro-esophageal reflux disease without esophagitis: Secondary | ICD-10-CM | POA: Insufficient documentation

## 2018-12-15 NOTE — Progress Notes (Signed)
Patient ID: Alejandro Jones, male   DOB: 03/07/1956, 63 y.o.   MRN: [id number] HPI  Complaint:  Visit Type: Patient returns to my office for continued preventative foot care services. Complaint: Patient states" my nails have grown long and thick and become painful to walk and wear shoes" Patient has been diagnosed with DM with vascular and neuropathy. Marland Kitchen He presents for preventative foot care services. No changes to ROS.  Patient says he has been losing his balance lately  Podiatric Exam: Vascular: dorsalis pedis and posterior tibial pulses are negative. Capillary return is immediate. Temperature gradient is negative. Skin turgor WNL,   Sensorium: Absent Semmes Weinstein monofilament test.   Nail Exam: Pt has thick disfigured discolored nails with subungual debris noted bilateral entire nail hallux through fifth toenails Ulcer Exam: There is no evidence of ulcer or pre-ulcerative changes or infection. Orthopedic Exam: Muscle tone and strength are WNL. No limitations in general ROM. No crepitus or effusions noted. Foot type and digits show no abnormalities. Bony prominences are unremarkable. Skin: No Porokeratosis.  No foot ulcers.  Diagnosis:  Tinea unguium, Pain in right toe, pain in left toes  Treatment & Plan Procedures and Treatment: Consent by patient was obtained for treatment procedures. The patient understood the discussion of treatment and procedures well. All questions were answered thoroughly reviewed. Debridement of mycotic and hypertrophic toenails, 1 through 5 bilateral and clearing of subungual debris. Patient was sent home with information on balance brace. Return Visit-Office Procedure: Patient instructed to return to the office for a follow up visit   3 months for continued evaluation and treatment.   Helane Gunther DPM

## 2018-12-16 DIAGNOSIS — F331 Major depressive disorder, recurrent, moderate: Secondary | ICD-10-CM | POA: Insufficient documentation

## 2019-04-20 ENCOUNTER — Encounter: Payer: Self-pay | Admitting: Podiatry

## 2019-04-20 ENCOUNTER — Other Ambulatory Visit: Payer: Self-pay

## 2019-04-20 ENCOUNTER — Ambulatory Visit (INDEPENDENT_AMBULATORY_CARE_PROVIDER_SITE_OTHER): Payer: Medicare HMO | Admitting: Podiatry

## 2019-04-20 DIAGNOSIS — E1151 Type 2 diabetes mellitus with diabetic peripheral angiopathy without gangrene: Secondary | ICD-10-CM | POA: Diagnosis not present

## 2019-04-20 DIAGNOSIS — B351 Tinea unguium: Secondary | ICD-10-CM

## 2019-04-20 DIAGNOSIS — M79674 Pain in right toe(s): Secondary | ICD-10-CM

## 2019-04-20 DIAGNOSIS — E1142 Type 2 diabetes mellitus with diabetic polyneuropathy: Secondary | ICD-10-CM

## 2019-04-20 NOTE — Progress Notes (Addendum)
Patient ID: Alejandro Jones, male   DOB: [birth year], 63 y.o.   MRN: [id number] HPI  Complaint:  Visit Type: Patient returns to my office for continued preventative foot care services. Complaint: Patient states" my nails have grown long and thick and become painful to walk and wear shoes" Patient has been diagnosed with DM with vascular and neuropathy. Marland Kitchen He presents for preventative foot care services. No changes to ROS.  Patient says he has been losing his balance lately.  Patient presents with his wife.  Podiatric Exam: Vascular: dorsalis pedis and posterior tibial pulses are negative. Capillary return is immediate. Temperature gradient is negative. Skin turgor WNL,   Sensorium: Absent Semmes Weinstein monofilament test.   Nail Exam: Pt has thick disfigured discolored nails with subungual debris noted bilateral entire nail hallux through fifth toenails Ulcer Exam: There is no evidence of ulcer or pre-ulcerative changes or infection. Orthopedic Exam: Muscle tone and strength are WNL. No limitations in general ROM. No crepitus or effusions noted. Foot type and digits show no abnormalities. Hammer toes  B/L Skin: No Porokeratosis.  No foot ulcers.  Diagnosis:  Tinea unguium, Pain in right toe, pain in left toes  Treatment & Plan Procedures and Treatment: Consent by patient was obtained for treatment procedures. The patient understood the discussion of treatment and procedures well. All questions were answered thoroughly reviewed. Debridement of mycotic and hypertrophic toenails, 1 through 5 bilateral and clearing of subungual debris. 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 Return Visit-Office Procedure: Patient instructed to return to the office for a follow up visit   3 months for continued evaluation and treatment.   Gardiner Barefoot DPM

## 2019-05-14 ENCOUNTER — Ambulatory Visit: Payer: Medicare HMO | Admitting: Orthotics

## 2019-05-14 ENCOUNTER — Other Ambulatory Visit: Payer: Self-pay

## 2019-05-14 DIAGNOSIS — E1151 Type 2 diabetes mellitus with diabetic peripheral angiopathy without gangrene: Secondary | ICD-10-CM | POA: Diagnosis not present

## 2019-05-14 DIAGNOSIS — M2041 Other hammer toe(s) (acquired), right foot: Secondary | ICD-10-CM | POA: Diagnosis not present

## 2019-05-14 DIAGNOSIS — M2042 Other hammer toe(s) (acquired), left foot: Secondary | ICD-10-CM

## 2019-08-10 ENCOUNTER — Ambulatory Visit: Payer: Self-pay

## 2019-08-10 ENCOUNTER — Encounter: Payer: Self-pay | Admitting: Podiatry

## 2019-08-10 ENCOUNTER — Other Ambulatory Visit: Payer: Self-pay

## 2019-08-10 ENCOUNTER — Ambulatory Visit: Payer: Medicare HMO | Admitting: Podiatry

## 2019-08-10 DIAGNOSIS — E1151 Type 2 diabetes mellitus with diabetic peripheral angiopathy without gangrene: Secondary | ICD-10-CM

## 2019-08-10 DIAGNOSIS — M79674 Pain in right toe(s): Secondary | ICD-10-CM

## 2019-08-10 DIAGNOSIS — S92352A Displaced fracture of fifth metatarsal bone, left foot, initial encounter for closed fracture: Secondary | ICD-10-CM

## 2019-08-10 DIAGNOSIS — S99922A Unspecified injury of left foot, initial encounter: Secondary | ICD-10-CM

## 2019-08-10 DIAGNOSIS — B351 Tinea unguium: Secondary | ICD-10-CM | POA: Diagnosis not present

## 2019-08-10 DIAGNOSIS — S92309A Fracture of unspecified metatarsal bone(s), unspecified foot, initial encounter for closed fracture: Secondary | ICD-10-CM | POA: Insufficient documentation

## 2019-08-10 DIAGNOSIS — E1142 Type 2 diabetes mellitus with diabetic polyneuropathy: Secondary | ICD-10-CM

## 2019-08-10 NOTE — Progress Notes (Signed)
Patient ID: Alejandro Jones, male   DOB: Nov 14, 1955, 64 y.o.   MRN: [id number] HPI  Complaint:  Visit Type: Patient returns to my office for continued preventative foot care services. Complaint: Patient states" my nails have grown long and thick and become painful to walk and wear shoes" Patient has been diagnosed with DM with vascular and neuropathy. Marland Kitchen He presents for preventative foot care services. No changes to ROS.  Patient says he has been losing his balance lately.  This patient says he feel at home and heard a pop in his left foot upon falling.  He says he has continued walking on his foot for the last 3 days since he was already schedule for nail care.  He requests that he be examined for his painful left foot.  Podiatric Exam: Vascular: dorsalis pedis and posterior tibial pulses are negative. Capillary return is immediate. Temperature gradient is negative. Skin turgor WNL,   Sensorium: Absent Semmes Weinstein monofilament test.   Nail Exam: Pt has thick disfigured discolored nails with subungual debris noted bilateral entire nail hallux through fifth toenails Ulcer Exam: There is no evidence of ulcer or pre-ulcerative changes or infection. Orthopedic Exam: Muscle tone and strength are WNL. No limitations in general ROM. No crepitus or effusions noted. Foot type and digits show no abnormalities. Hammer toes  B/L  Redness and swelling and increased temperature noted over the fifth metatarsal shaft left foot. Skin: No Porokeratosis.  No foot ulcers.  Diagnosis:  Tinea unguium, Pain in right toe, pain in left toes  Colsed metatarsal fracture fifth metatarsal left foot.  Treatment & Plan Procedures and Treatment: Consent by patient was obtained for treatment procedures. The patient understood the discussion of treatment and procedures well. All questions were answered thoroughly reviewed. Debridement of mycotic and hypertrophic toenails, 1 through 5 bilateral and clearing of subungual  debris. X-rays were taken reveal minimally  displaced fracture on the distal shaft fifth metatarsal left foot.  Patient was prescribed cam walker to help him ambulate in less pain.  Patient to return in 4 weeks for continued evaluation of fracture.  Patient says he is not interested in surgery to fixate fracture. Return Visit-Office Procedure: Patient instructed to return to the office for a follow up visit   3 months for continued evaluation and treatment.   Helane Gunther DPM

## 2019-10-05 ENCOUNTER — Ambulatory Visit (INDEPENDENT_AMBULATORY_CARE_PROVIDER_SITE_OTHER): Payer: Medicare HMO

## 2019-10-05 ENCOUNTER — Encounter: Payer: Self-pay | Admitting: Podiatry

## 2019-10-05 ENCOUNTER — Other Ambulatory Visit: Payer: Self-pay

## 2019-10-05 ENCOUNTER — Ambulatory Visit (INDEPENDENT_AMBULATORY_CARE_PROVIDER_SITE_OTHER): Payer: Medicare HMO | Admitting: Podiatry

## 2019-10-05 VITALS — Temp 97.2°F

## 2019-10-05 DIAGNOSIS — S92352D Displaced fracture of fifth metatarsal bone, left foot, subsequent encounter for fracture with routine healing: Secondary | ICD-10-CM

## 2019-10-05 NOTE — Progress Notes (Signed)
This patient returns to the office for diagnosis of a closed metatarsal fracture fifth metatarsal left foot.  He says he has been wearing his cam walker for 8 weeks.  He says that the foot has been improving and his pain has been diminishing.  There is no longer any redness swelling or increased temperature over the shaft of the fifth metatarsal left foot.  Patient returns to the office today for continued evaluation and treatment of his closed fracture left foot left foot.  General Appearance  Alert, conversant and in no acute stress.  Vascular  Dorsalis pedis and posterior tibial  pulses are negative  bilaterally.  Capillary return is within normal limits  bilaterally. Temperature is within normal limits  bilaterally.  Neurologic  Senn-Weinstein monofilament wire test  absent   bilaterally. Muscle power within normal limits bilaterally.  Nails Thick disfigured discolored nails with subungual debris  from hallux to fifth toes bilaterally. No evidence of bacterial infection or drainage bilaterally.  Orthopedic  No limitations of motion  feet .  No crepitus or effusions noted.  Hammer toes  B/L.  No palpable pain fifth metatarsal left foot at fracture site.  Decreased redness and swelling and increased temperature at site of fracture.  Skin  normotropic skin with no porokeratosis noted bilaterally.  No signs of infections or ulcers noted.    S/P Meatararsal fracture fifth metatarsal left foot.  ROV x-ray taken reviewed can reveal good alignment of the fracture site but no clear evidence of callus formation at the fracture site.  Clinically he is doing well with no pain and discomfort upon examination or palpation.  He was told that he could return to his regular foot gear keep his cam walker close if there is pain or discomfort.  Dispensed a compression sock to help stabilize and limit swelling.  Patient to return to the office in 4 weeks for nail care as well as evaluation of the fracture.  Helane Gunther DPM

## 2019-11-24 ENCOUNTER — Ambulatory Visit: Payer: Medicare HMO | Admitting: Podiatry

## 2019-11-24 ENCOUNTER — Encounter: Payer: Self-pay | Admitting: Podiatry

## 2019-11-24 ENCOUNTER — Other Ambulatory Visit: Payer: Self-pay

## 2019-11-24 DIAGNOSIS — M79674 Pain in right toe(s): Secondary | ICD-10-CM | POA: Diagnosis not present

## 2019-11-24 DIAGNOSIS — E1151 Type 2 diabetes mellitus with diabetic peripheral angiopathy without gangrene: Secondary | ICD-10-CM | POA: Diagnosis not present

## 2019-11-24 DIAGNOSIS — S92352D Displaced fracture of fifth metatarsal bone, left foot, subsequent encounter for fracture with routine healing: Secondary | ICD-10-CM | POA: Diagnosis not present

## 2019-11-24 DIAGNOSIS — B351 Tinea unguium: Secondary | ICD-10-CM | POA: Diagnosis not present

## 2019-11-24 NOTE — Progress Notes (Signed)
This patient returns to my office for at risk foot care.  This patient requires this care by a professional since this patient will be at risk due to having peripheral neuropathy, CVA and diabetes with vascular pathology.  This patient is unable to cut nails himself since the patient cannot reach his nails.These nails are painful walking and wearing shoes. Patient states his closed fracture left foot has healed and no pain noted.  He discontinued wearing anklet due to toes turning purple. This patient presents for at risk foot care today.  General Appearance  Alert, conversant and in no acute stress.  Vascular  Dorsalis pedis and posterior tibial  pulses are palpable  bilaterally.  Capillary return is within normal limits  bilaterally. Temperature is within normal limits  bilaterally.  Neurologic  Senn-Weinstein monofilament wire test within normal limits  bilaterally. Muscle power within normal limits bilaterally.  Nails Thick disfigured discolored nails with subungual debris  from hallux to fifth toes bilaterally. No evidence of bacterial infection or drainage bilaterally.  Orthopedic  No limitations of motion  feet .  No crepitus or effusions noted.  No bony pathology or digital deformities noted.  Skin  normotropic skin with no porokeratosis noted bilaterally.  No signs of infections or ulcers noted.     Onychomycosis  Pain in right toes  Pain in left toes  Consent was obtained for treatment procedures.   Mechanical debridement of nails 1-5  bilaterally performed with a nail nipper.  Filed with dremel without incident.    Return office visit   4 months                  Told patient to return for periodic foot care and evaluation due to potential at risk complications.   Helane Gunther DPM

## 2020-03-28 ENCOUNTER — Other Ambulatory Visit: Payer: Self-pay

## 2020-03-28 ENCOUNTER — Encounter: Payer: Self-pay | Admitting: Podiatry

## 2020-03-28 ENCOUNTER — Ambulatory Visit: Payer: Medicare HMO | Admitting: Podiatry

## 2020-03-28 DIAGNOSIS — M79674 Pain in right toe(s): Secondary | ICD-10-CM

## 2020-03-28 DIAGNOSIS — B351 Tinea unguium: Secondary | ICD-10-CM

## 2020-03-28 DIAGNOSIS — I739 Peripheral vascular disease, unspecified: Secondary | ICD-10-CM

## 2020-03-28 DIAGNOSIS — E1151 Type 2 diabetes mellitus with diabetic peripheral angiopathy without gangrene: Secondary | ICD-10-CM

## 2020-03-28 NOTE — Progress Notes (Signed)
This patient returns to my office for at risk foot care.  This patient requires this care by a professional since this patient will be at risk due to having peripheral neuropathy, CVA and diabetes with vascular pathology.  This patient is unable to cut nails himself since the patient cannot reach his nails.These nails are painful walking and wearing shoes. This patient presents for at risk foot care today.  General Appearance  Alert, conversant and in no acute stress.  Vascular  Dorsalis pedis and posterior tibial  pulses are weakly palpable  bilaterally.  Capillary return is within normal limits  bilaterally. Temperature is within normal limits  bilaterally.  Neurologic  Senn-Weinstein monofilament wire test diminished  bilaterally. Muscle power within normal limits bilaterally.  Nails Thick disfigured discolored nails with subungual debris  from hallux to fifth toes bilaterally. No evidence of bacterial infection or drainage bilaterally.  Orthopedic  No limitations of motion  feet .  No crepitus or effusions noted.  No bony pathology or digital deformities noted. Plantar fasciitis left foot due to cavus foot type.  Skin  normotropic skin with no porokeratosis noted bilaterally.  No signs of infections or ulcers noted.     Onychomycosis  Pain in right toes  Pain in left toes  Consent was obtained for treatment procedures.   Mechanical debridement of nails 1-5  bilaterally performed with a nail nipper.  Filed with dremel without incident.    Return office visit   4 months                  Told patient to return for periodic foot care and evaluation due to potential at risk complications.   Helane Gunther DPM

## 2020-08-01 ENCOUNTER — Other Ambulatory Visit: Payer: Self-pay

## 2020-08-01 ENCOUNTER — Ambulatory Visit: Payer: Medicare HMO | Admitting: Podiatry

## 2020-08-01 ENCOUNTER — Encounter: Payer: Self-pay | Admitting: Podiatry

## 2020-08-01 DIAGNOSIS — M79674 Pain in right toe(s): Secondary | ICD-10-CM

## 2020-08-01 DIAGNOSIS — B351 Tinea unguium: Secondary | ICD-10-CM | POA: Diagnosis not present

## 2020-08-01 DIAGNOSIS — I739 Peripheral vascular disease, unspecified: Secondary | ICD-10-CM

## 2020-08-01 DIAGNOSIS — E1151 Type 2 diabetes mellitus with diabetic peripheral angiopathy without gangrene: Secondary | ICD-10-CM | POA: Diagnosis not present

## 2020-08-01 NOTE — Progress Notes (Signed)
This patient returns to my office for at risk foot care.  This patient requires this care by a professional since this patient will be at risk due to having peripheral neuropathy, CVA and diabetes with vascular pathology.  This patient is unable to cut nails himself since the patient cannot reach his nails.These nails are painful walking and wearing shoes. This patient presents for at risk foot care today.  General Appearance  Alert, conversant and in no acute stress.  Vascular  Dorsalis pedis and posterior tibial  pulses are absent   bilaterally.  Capillary return is within normal limits  bilaterally. Cold feet   Bilaterally. Minimal purplish hue on dorsum left foot.  Neurologic  Senn-Weinstein monofilament wire test diminished  bilaterally. Muscle power within normal limits bilaterally.  Nails Thick disfigured discolored nails with subungual debris  from hallux to fifth toes bilaterally. No evidence of bacterial infection or drainage bilaterally.  Orthopedic  No limitations of motion  feet .  No crepitus or effusions noted.  No bony pathology or digital deformities noted. Plantar fasciitis left foot due to cavus foot type.  Skin  normotropic skin with no porokeratosis noted bilaterally.  No signs of infections or ulcers noted.     Onychomycosis  Pain in right toes  Pain in left toes  Consent was obtained for treatment procedures.   Mechanical debridement of nails 1-5  bilaterally performed with a nail nipper.  Filed with dremel without incident.    Return office visit  3 months                  Told patient to return for periodic foot care and evaluation due to potential at risk complications.   Helane Gunther DPM

## 2020-11-07 ENCOUNTER — Other Ambulatory Visit: Payer: Self-pay

## 2020-11-07 ENCOUNTER — Ambulatory Visit: Payer: Medicare HMO | Admitting: Podiatry

## 2020-11-07 ENCOUNTER — Encounter: Payer: Self-pay | Admitting: Podiatry

## 2020-11-07 DIAGNOSIS — B351 Tinea unguium: Secondary | ICD-10-CM

## 2020-11-07 DIAGNOSIS — E1151 Type 2 diabetes mellitus with diabetic peripheral angiopathy without gangrene: Secondary | ICD-10-CM | POA: Diagnosis not present

## 2020-11-07 DIAGNOSIS — I739 Peripheral vascular disease, unspecified: Secondary | ICD-10-CM

## 2020-11-07 DIAGNOSIS — M79674 Pain in right toe(s): Secondary | ICD-10-CM | POA: Diagnosis not present

## 2020-11-07 NOTE — Progress Notes (Signed)
This patient returns to my office for at risk foot care.  This patient requires this care by a professional since this patient will be at risk due to having peripheral neuropathy, CVA and diabetes with vascular pathology.  This patient is unable to cut nails himself since the patient cannot reach his nails.These nails are painful walking and wearing shoes. This patient presents for at risk foot care today.  General Appearance  Alert, conversant and in no acute stress.  Vascular  Dorsalis pedis and posterior tibial  pulses are absent   bilaterally.  Capillary return is within normal limits  bilaterally. Cold feet   Bilaterally. Minimal purplish hue on dorsum left foot.  Neurologic  Senn-Weinstein monofilament wire test diminished  bilaterally. Muscle power within normal limits bilaterally.  Nails Thick disfigured discolored nails with subungual debris  from hallux to fifth toes bilaterally. No evidence of bacterial infection or drainage bilaterally.  Orthopedic  No limitations of motion  feet .  No crepitus or effusions noted.  No bony pathology or digital deformities noted. Plantar fasciitis left foot due to cavus foot type.  Hammer toes due to cavus foot.  Skin  normotropic skin with no porokeratosis noted bilaterally.  No signs of infections or ulcers noted.     Onychomycosis  Pain in right toes  Pain in left toes  Consent was obtained for treatment procedures.   Mechanical debridement of nails 1-5  bilaterally performed with a nail nipper.  Filed with dremel without incident. Patient qualifies for diabetic shoes due to DPN and hammer toes secondary to cavus foot.   Return office visit  3 months                  Told patient to return for periodic foot care and evaluation due to potential at risk complications.   Helane Gunther DPM

## 2020-11-08 ENCOUNTER — Encounter: Payer: Self-pay | Admitting: Podiatry

## 2020-11-08 ENCOUNTER — Ambulatory Visit (INDEPENDENT_AMBULATORY_CARE_PROVIDER_SITE_OTHER): Payer: Medicare HMO | Admitting: Podiatry

## 2020-11-08 DIAGNOSIS — E1151 Type 2 diabetes mellitus with diabetic peripheral angiopathy without gangrene: Secondary | ICD-10-CM

## 2020-11-08 DIAGNOSIS — E1142 Type 2 diabetes mellitus with diabetic polyneuropathy: Secondary | ICD-10-CM

## 2020-11-08 NOTE — Progress Notes (Signed)
Patient presented for foam casting for 3 pair custom diabetic shoes inserts. Patient is measured with a [employer] Device to be a size 11 1/2 wide.  Diabetic shoes are chosen from the [employer] The shoes chosen are 672  The patient will be contacted when the shoes and inserts are ready to be picked up.

## 2020-12-26 ENCOUNTER — Telehealth: Payer: Self-pay | Admitting: Podiatry

## 2020-12-26 NOTE — Telephone Encounter (Signed)
Diabetic shoes/inserts in.. lvm on 5.12  pts wife called and left message today checking on status of diabetic shoes.  I returned call and left message that shoes are in and to call to schedule an appt to pick them up .

## 2020-12-27 ENCOUNTER — Telehealth: Payer: Self-pay | Admitting: Podiatry

## 2020-12-27 NOTE — Telephone Encounter (Signed)
pts wife called asking about pts diabetic shoes again wanting to pick up this week because they are going out of town for the weekend.  I returned call and voicemail is full. We have a full schedule for this week. Next available is next tues, Wednesday or Thursday.

## 2020-12-29 ENCOUNTER — Ambulatory Visit (INDEPENDENT_AMBULATORY_CARE_PROVIDER_SITE_OTHER): Payer: Medicare HMO

## 2020-12-29 ENCOUNTER — Other Ambulatory Visit: Payer: Self-pay

## 2020-12-29 DIAGNOSIS — E1151 Type 2 diabetes mellitus with diabetic peripheral angiopathy without gangrene: Secondary | ICD-10-CM

## 2020-12-29 DIAGNOSIS — I739 Peripheral vascular disease, unspecified: Secondary | ICD-10-CM

## 2020-12-29 DIAGNOSIS — M2041 Other hammer toe(s) (acquired), right foot: Secondary | ICD-10-CM

## 2020-12-29 DIAGNOSIS — E1142 Type 2 diabetes mellitus with diabetic polyneuropathy: Secondary | ICD-10-CM

## 2020-12-29 DIAGNOSIS — M2042 Other hammer toe(s) (acquired), left foot: Secondary | ICD-10-CM

## 2021-01-12 NOTE — Progress Notes (Signed)
Patient in office to pick up diabetic shoes. Patient was seen by EJ with [facility]. Shoes were tried on in office and the break-in process was explained to the patient.

## 2021-02-07 ENCOUNTER — Ambulatory Visit: Payer: Medicare HMO | Admitting: Podiatry

## 2021-04-02 ENCOUNTER — Ambulatory Visit: Payer: Medicare HMO | Admitting: Podiatry

## 2021-04-02 ENCOUNTER — Other Ambulatory Visit: Payer: Self-pay

## 2021-04-02 ENCOUNTER — Encounter: Payer: Self-pay | Admitting: Podiatry

## 2021-04-02 DIAGNOSIS — E1142 Type 2 diabetes mellitus with diabetic polyneuropathy: Secondary | ICD-10-CM | POA: Diagnosis not present

## 2021-04-02 DIAGNOSIS — M79674 Pain in right toe(s): Secondary | ICD-10-CM

## 2021-04-02 DIAGNOSIS — B351 Tinea unguium: Secondary | ICD-10-CM

## 2021-04-02 DIAGNOSIS — I739 Peripheral vascular disease, unspecified: Secondary | ICD-10-CM

## 2021-04-02 NOTE — Progress Notes (Signed)
This patient returns to my office for at risk foot care.  This patient requires this care by a professional since this patient will be at risk due to having peripheral neuropathy, CVA and diabetes with vascular pathology.  This patient is unable to cut nails himself since the patient cannot reach his nails.These nails are painful walking and wearing shoes. This patient presents for at risk foot care today.  General Appearance  Alert, conversant and in no acute stress.  Vascular  Dorsalis pedis and posterior tibial  pulses are absent   bilaterally.  Capillary return is within normal limits  bilaterally. Cold feet   Bilaterally. Minimal purplish hue on dorsum left foot.  Neurologic  Senn-Weinstein monofilament wire test diminished  bilaterally. Muscle power within normal limits bilaterally.  Nails Thick disfigured discolored nails with subungual debris  from hallux to fifth toes bilaterally. No evidence of bacterial infection or drainage bilaterally.  Orthopedic  No limitations of motion  feet .  No crepitus or effusions noted.  No bony pathology or digital deformities noted. Plantar fasciitis left foot due to cavus foot type.  Hammer toes due to cavus foot.  Skin  normotropic skin with no porokeratosis noted bilaterally.  No signs of infections or ulcers noted.     Onychomycosis  Pain in right toes  Pain in left toes  Consent was obtained for treatment procedures.   Mechanical debridement of nails 1-5  bilaterally performed with a nail nipper.  Filed with dremel without incident.    Return office visit  4  months                  Told patient to return for periodic foot care and evaluation due to potential at risk complications.   Helane Gunther DPM

## 2021-08-01 ENCOUNTER — Ambulatory Visit: Payer: Medicare HMO | Admitting: Podiatry

## 2022-12-09 ENCOUNTER — Ambulatory Visit: Payer: Medicare HMO | Admitting: Podiatry

## 2022-12-09 ENCOUNTER — Encounter: Payer: Self-pay | Admitting: Podiatry

## 2022-12-09 DIAGNOSIS — E1142 Type 2 diabetes mellitus with diabetic polyneuropathy: Secondary | ICD-10-CM | POA: Diagnosis not present

## 2022-12-09 DIAGNOSIS — B351 Tinea unguium: Secondary | ICD-10-CM | POA: Diagnosis not present

## 2022-12-09 DIAGNOSIS — M79674 Pain in right toe(s): Secondary | ICD-10-CM | POA: Diagnosis not present

## 2022-12-09 DIAGNOSIS — I739 Peripheral vascular disease, unspecified: Secondary | ICD-10-CM | POA: Diagnosis not present

## 2022-12-09 DIAGNOSIS — M2041 Other hammer toe(s) (acquired), right foot: Secondary | ICD-10-CM

## 2022-12-09 DIAGNOSIS — M2042 Other hammer toe(s) (acquired), left foot: Secondary | ICD-10-CM

## 2022-12-09 NOTE — Progress Notes (Signed)
This patient returns to my office for at risk foot care.  This patient requires this care by a professional since this patient will be at risk due to having peripheral neuropathy, CVA and diabetes with vascular pathology.  This patient is unable to cut nails himself since the patient cannot reach his nails.These nails are painful walking and wearing shoes. This patient presents for at risk foot care today.  General Appearance  Alert, conversant and in no acute stress.  Vascular  Dorsalis pedis and posterior tibial  pulses are absent   bilaterally.  Capillary return is within normal limits  bilaterally. Cold feet   Bilaterally. Minimal purplish hue on dorsum left foot.  Neurologic  Senn-Weinstein monofilament wire test diminished  bilaterally. Muscle power within normal limits bilaterally.  Nails Thick disfigured discolored nails with subungual debris  from hallux to fifth toes bilaterally. No evidence of bacterial infection or drainage bilaterally.  Orthopedic  No limitations of motion  feet .  No crepitus or effusions noted.  No bony pathology or digital deformities noted. Plantar fasciitis left foot due to cavus foot type.  Hammer toes due to cavus foot.  Skin  normotropic skin with no porokeratosis noted bilaterally.  No signs of infections or ulcers noted.     Onychomycosis  Pain in right toes  Pain in left toes  Consent was obtained for treatment procedures.   Mechanical debridement of nails 1-5  bilaterally performed with a nail nipper.  Filed with dremel without incident. Patient request and is eligible for diabetic shoes.   Return office visit  3 months                  Told patient to return for periodic foot care and evaluation due to potential at risk complications.   Helane Gunther DPM

## 2023-01-17 ENCOUNTER — Telehealth: Payer: Self-pay | Admitting: Podiatry

## 2023-01-17 NOTE — Telephone Encounter (Signed)
Lmom for patient to call back to schedule picking up diabetic shoesl

## 2023-01-21 NOTE — Telephone Encounter (Signed)
Lmom for patient to call back to schedule picking up diabetic shoesl

## 2023-03-03 ENCOUNTER — Ambulatory Visit: Payer: Medicare HMO

## 2023-03-03 NOTE — Progress Notes (Signed)

## 2023-03-11 ENCOUNTER — Ambulatory Visit: Payer: Medicare HMO | Admitting: Podiatry

## 2024-06-17 ENCOUNTER — Encounter: Payer: Self-pay | Admitting: Podiatry

## 2024-06-17 ENCOUNTER — Ambulatory Visit (INDEPENDENT_AMBULATORY_CARE_PROVIDER_SITE_OTHER): Admitting: Podiatry

## 2024-06-17 DIAGNOSIS — E1142 Type 2 diabetes mellitus with diabetic polyneuropathy: Secondary | ICD-10-CM

## 2024-06-17 DIAGNOSIS — M79674 Pain in right toe(s): Secondary | ICD-10-CM | POA: Diagnosis not present

## 2024-06-17 DIAGNOSIS — B351 Tinea unguium: Secondary | ICD-10-CM | POA: Diagnosis not present

## 2024-06-17 DIAGNOSIS — I739 Peripheral vascular disease, unspecified: Secondary | ICD-10-CM

## 2024-06-17 NOTE — Progress Notes (Addendum)
 This patient returns to my office for at risk foot care.  This patient requires this care by a professional since this patient will be at risk due to having peripheral neuropathy, CVA and diabetes with vascular pathology.  This patient is unable to cut nails himself since the patient cannot reach his nails.These nails are painful walking and wearing shoes.  This patient has not been seen in over a year and half.  His wife questions getting diabetic shoes for him.This patient presents for at risk foot care today.  General Appearance  Alert, conversant and in no acute stress.  Vascular  Dorsalis pedis and posterior tibial  pulses are absent   bilaterally.  Capillary return is within normal limits  bilaterally. Cold feet   Bilaterally. Minimal purplish hue on dorsum left foot.  Neurologic  Senn-Weinstein monofilament wire test diminished  bilaterally. Muscle power within normal limits bilaterally.  Nails Thick disfigured discolored nails with subungual debris  from hallux to fifth toes bilaterally. No evidence of bacterial infection or drainage bilaterally.  Orthopedic  No limitations of motion  feet .  No crepitus or effusions noted.  No bony pathology or digital deformities noted. Plantar fasciitis left foot due to cavus foot type.  Hammer toes due to cavus foot.  Skin  normotropic skin with no porokeratosis noted bilaterally.  No signs of infections or ulcers noted.     Onychomycosis  Pain in right toes  Pain in left toes  Consent was obtained for treatment procedures.   Mechanical debridement of nails 1-5  bilaterally performed with a nail nipper.  Filed with dremel without incident. Patient is to be seen by pedorthist. She dispenses slippers for him since he needs shoes.   Return office visit  3 months                  Told patient to return for periodic foot care and evaluation due to potential at risk complications.   Cordella Bold DPM

## 2024-08-02 NOTE — ED Provider Notes (Signed)
 Patient placed in First Look pathway, seen and evaluated for chief complaint of shortness of breath and cough.  Diagnosed with pneumonia back on 1220 antibiotic and put on antibiotics.  Symptoms not improved  Pertinent exam findings include non-toxic in appearance and speech is clear. Based on initial evaluation, labs are currently indicated and radiology studies are currently indicated as allowed for current processes and treatments as applicable in a triage setting and could be different than if patient were seen in a main treatment area or dependent on labs/imagining after results are displayed.  Patient counseled on process, plan, and necessity for staying for completing the evaluation.   This document serves as a record of services personally performed by Charley Herlene RIGGERS  Emergency Department Provider Note     Provider at bedside: 2:08 PM  History obtained from the: Patient and wife  History   Chief Complaint  Patient presents with   Shortness of Breath     Patient presents with complaints of shortness of breath and low blood pressure.  The patient has recently been diagnosed with pneumonia and was being treated as an outpatient.  He went to his PMD's office today where he was noted to be very weak, had difficulty breathing, and had a low blood pressure.  He was sent here for further evaluation.  The patient states he has felt bad for a couple of days.  His wife states that has been taking antibiotics and not getting any better.  He has had low-grade fever at the doctor's office this morning but otherwise has not had a fever.       No LMP for male patient.   Past Medical History Medical History[1]  Past Surgical History Surgical History[2]   Medications Prior to Admission medications  Medication Sig Start Date End Date Taking? Authorizing Provider  acetaminophen  (TYLENOL ) 500 mg tablet Take 1,000 mg by mouth every 6 (six) hours as needed for mild pain (1-3) Indications:  wife stated up to 3 times a day but usually no more than twice a day.    HISTORICAL PROVIDER, CONVERSION  albuterol  2.5 mg /3 mL (0.083 %) nebulizer solution Take 3 mL (2.5 mg total) by nebulization every 6 (six) hours as needed for wheezing. 10/27/23 08/02/24  Baker Adela Bare, ANP  albuterol  HFA (PROVENTIL  HFA;VENTOLIN  HFA;PROAIR  HFA) 90 mcg/actuation inhaler Inhale 1 puff every 6 (six) hours as needed for wheezing. 12/11/22   Zackary Daniel Rogers, PA-C  amoxicillin -pot clavulanate (AUGMENTIN ) 875-125 mg per tablet Take 1 tablet by mouth 2 (two) times a day for 10 days. 07/24/24 08/03/24  Franky Floria Finder, PA-C  ascorbic acid (VITAMIN C) 500 mg tablet Take 1 tablet (500 mg total) by mouth 3 (three) times a week. Take with ferrous sulfate . 08/27/23 08/02/24  Josette Nat Sharl Flonnie, MD  aspirin  81 mg EC tablet Take 81 mg by mouth at bedtime. 01/14/14   HISTORICAL PROVIDER, CONVERSION  atorvastatin  (LIPITOR) 40 mg tablet Take 1 tablet (40 mg total) by mouth at bedtime. 01/13/24   Fonda Norman Splinter, MD  buPROPion (WELLBUTRIN XL) 300 mg 24 hr tablet TAKE 1 TABLET EVERY DAY 02/02/24   Murray CHRISTELLA Amos, MD  colchicine 0.6 mg tablet Take 1 tablet by mouth once daily 06/17/24   Murray CHRISTELLA Amos, MD  cyanocobalamin  (VITAMIN B12) 1,000 mcg tablet Take 1,000 mcg by mouth Once Daily for 30 days. 09/26/15   Murray CHRISTELLA Amos, MD  diclofenac sodium (VOLTAREN) 1 % gel Apply 2 g topically 3 (three)  times a day. 02/27/24 08/02/24  Delmus Garrie Lash, MD  doxycycline  (VIBRA -TABS) 100 mg tablet Take 1 tablet (100 mg total) by mouth 2 (two) times a day for 10 days. Take with 8 oz water. Do not lie down for at least 30 minutes after. 07/24/24 08/03/24  Franky Floria Finder, PA-C  escitalopram  (LEXAPRO ) 10 mg tablet TAKE 1 TABLET EVERY DAY 06/18/24   Murray CHRISTELLA Amos, MD  ezetimibe (ZETIA) 10 mg tablet TAKE 1 TABLET EVERY DAY 02/11/24   Murray CHRISTELLA Amos, MD  ferrous sulfate  (Iron, ferrous sulfate ,) 325 mg (65 mg iron) tablet Take 1  tablet (325 mg total) by mouth 3 (three) times a week. 08/27/23 08/02/24  Josette Nat Sharl Flonnie, MD  finasteride (PROSCAR) 5 mg tablet Take 1 tablet (5 mg total) by mouth daily. 06/09/24 06/09/25  Redell Lynwood Napoleon, MD  furosemide  (LASIX ) 20 mg tablet Take 1 tablet (20 mg total) by mouth daily. 07/05/24 01/01/25  Murray CHRISTELLA Amos, MD  gabapentin (NEURONTIN) 300 mg capsule Take 600 mg by mouth nightly.    HISTORICAL PROVIDER, CONVERSION  lidocaine  (LIDODERM ) 5 % patch APPLY UP TO 3 PATCHES EVERY DAY AS DIRECTED. (ALLOW PATCHES TO REMAIN ON THE AREA FOR UP TO 12 HOURS IN A 24 HOUR PERIOD) 12/25/21   Evalene Fonda Rundle, MD  magnesium  oxide 400 mg tab tablet Take 1 tablet (400 mg total) by mouth 2 (two) times a day. 02/09/24   Murray CHRISTELLA Amos, MD  metoprolol succinate (TOPROL XL) 25 mg 24 hr tablet Take 0.5 tablets (12.5 mg total) by mouth daily. 04/29/24 04/29/25  Erna Creighton, MD  mupirocin  (BACTROBAN ) 2 % ointment Apply topically daily. To affected areas as needed. 04/23/23   Hoy Dallis Herald, PA-C  nebulizers misc Please dispense 1 nebulizer with compressor and associated tubing 10/29/23   Murray CHRISTELLA Amos, MD  nitroglycerin  (NITROSTAT ) 0.4 mg SL tablet Place 1 tablet (0.4 mg total) under the tongue every 5 (five) minutes as needed for chest pain. 10/27/23   Baker Adela Bare, ANP  pantoprazole  (PROTONIX ) 40 mg EC tablet TAKE 1 TABLET TWICE DAILY 04/16/24   Yuri M Cabeza, MD  pentoxifylline (TRENtal) 400 mg CR tablet TAKE 1 TABLET IN THE MORNING AND 1 TABLET AT NOON AND 1 TABLET IN THE EVENING. TAKE WITH MEALS. 01/26/24   Pavel J Levy, MD  sacubitriL-valsartan (Entresto) 24-26 mg per tablet Take 0.5 tablets by mouth 2 (two) times a day. 03/15/24   Erna Creighton, MD  sennosides-docusate sodium  (PERICOLACE) 8.6-50 mg per tablet Take 3 tablets by mouth Once Daily for 90 days. 07/10/21   Elizabeth Tina Flynt, NP  SITagliptin phos-metFORMIN  (Janumet XR) 50-1,000 mg TM24 Take 1 tablet by mouth daily.  06/03/24   Murray CHRISTELLA Amos, MD  solifenacin (VESICARE) 10 mg tablet Take 1 tablet (10 mg total) by mouth daily. Swallow tablet whole; do not crush, chew, or split. 06/09/24 06/09/25  Redell Lynwood Napoleon, MD  tamsulosin (FLOMAX) 0.4 mg cap Take 2 capsules (0.8 mg total) by mouth daily. Patient taking differently: Take 0.4 mg by mouth daily. 06/09/24   Redell Lynwood Napoleon, MD  topiramate (TOPAMAX) 50 mg tablet Take 1 tablet (50 mg total) by mouth 2 (two) times a day. 05/28/24 05/28/25  Slater Earnie Lathe, PA-C  traMADoL  (ULTRAM ) 50 mg tablet Take 1 tablet (50 mg total) by mouth every 6 (six) hours as needed for moderate pain (4-6) or severe pain (7-10). 05/20/24   Slater Earnie Lathe, PA-C  trifluoperazine (STELAZINE) 2 mg  tablet Take 2 tablets (4 mg total) by mouth daily. Take 2 tablets daily at night 04/26/24 04/26/25  Camie Earnie Brunswick, MD  UNABLE TO FIND bipap machine    HISTORICAL PROVIDER, CONVERSION  zinc oxide-cod liver oil 40 % paste Apply topically 2 (two) times a day. Patient not taking: Reported on 08/02/2024 02/27/24   Delmus Garrie Lash, MD     Allergies Allergies[3]   Family History Family History[4]   Social History Social History[5]   Review of Systems  Review of Systems  Constitutional:  Positive for fever. Negative for chills.  Respiratory:  Positive for shortness of breath.   Cardiovascular:  Negative for chest pain.  Gastrointestinal:  Negative for abdominal pain, diarrhea, nausea and vomiting.  Neurological:  Positive for weakness.     Physical Exam   Vitals:   08/02/24 1643 08/02/24 1650 08/02/24 1653 08/02/24 1700  BP:    101/72  BP Location:      Patient Position:      Pulse: 102 102 101 102  Resp: (!) 23 17 20  (!) 21  Temp:      TempSrc:      SpO2: 98% 96% 95% 97%  Weight:      Height:        Physical Exam Vitals and nursing note reviewed.  Constitutional:      General: He is not in acute distress.    Appearance: He is ill-appearing.  HENT:     Head:  Normocephalic and atraumatic.  Cardiovascular:     Rate and Rhythm: Normal rate and regular rhythm.  Pulmonary:     Breath sounds: Examination of the right-upper field reveals decreased breath sounds. Examination of the left-upper field reveals decreased breath sounds. Examination of the right-middle field reveals decreased breath sounds. Examination of the left-middle field reveals decreased breath sounds. Examination of the right-lower field reveals decreased breath sounds and wheezing. Examination of the left-lower field reveals decreased breath sounds and wheezing. Decreased breath sounds and wheezing present.  Abdominal:     Palpations: Abdomen is soft.     Tenderness: There is no abdominal tenderness.  Skin:    General: Skin is warm and dry.     Capillary Refill: Capillary refill takes less than 2 seconds.  Neurological:     General: No focal deficit present.     Mental Status: He is alert and oriented to person, place, and time.  Psychiatric:        Mood and Affect: Mood normal.        Behavior: Behavior normal.      Previous Chart Review   I reviewed patient's office visit from earlier today.  Differential Diagnosis   Pneumonia, sepsis, pulmonary edema, heart failure,  Cardiac Monitor Interpretation   Sinus at 103, 94% sats.  EKG Interpretation and HEAR Score   None     Initial Radiology Interpretation & Consideration   My initial interpretation of patient's chest x-ray: Interstitial edema  Radiology reads of all radiologic studies have been reviewed.  Lab Interpretation   Blood gas shows pH 7.299, pCO2 60.1.  CMP shows sodium 135, chloride 96, glucose 152, BUN 34, creatinine 2.02.  Lactic acid 1.5.  CBC shows white count 12.0.  BNP is 153, troponins are 18 and 16 respectively.   Interventions   Patient given evidence of mass, IV Rocephin , IV fluids  Procedure Note  Procedures   Consults   08/02/2024 5:35 PM Case discussed with Deneen Pick  with Hospitalist who will come  see patient for admission.   MDM   ED Course as of 08/02/24 1735  Mon Aug 02, 2024  1618 Patient presented today with low blood pressure after visiting his doctor's office.  He is currently being treated for pneumonia with antibiotics.  On his initial presentation today the patient looked more like he might have mild pulmonary edema and hypercapnic respiratory failure to me.  I think overt sepsis is unlikely.  However, given the recent diagnosis and current treatment with antibiotics we will continue some antibiotics here but also treat the patient with some gentle fluids due to a low blood pressure and elevated creatinine.  Patient's most recent EF was 40 to 45%.  His chest x-ray today showed mild interstitial edema with no clear pneumonia.  With those findings, I did not feel comfortable giving a large fluid bolus. [JW]    ED Course User Index [JW] Dorn Arloa Hoover, MD     Clinical Complexity  Patient's presentation is most consistent with acute presentation with potential threat to life or bodily function.  Provider time spent in patient care today, inclusive of but not limited to clinical reassessment, review of diagnostic studies, and discharge preparation, was greater than 30 minutes.   ED Clinical Impression   1. Acute on chronic respiratory failure with hypercapnia    (CMD) Acute  2. Acute pulmonary edema    (CMD) Acute    ED Disposition   Patient will be admitted for further evaluation and treatment.  _______________________________________________________________________        [1] Past Medical History: Diagnosis Date   Allergy    Anatomical narrow angle of both eyes    Arthritis    Asthma (CMD)    Blepharitis of upper and lower eyelids of both eyes, unspecified type    Cataract    Cerebral aneurysm (CMD)    CHF (congestive heart failure)    (CMD)    Chronic bronchitis    (CMD)    Chronic eczematous otitis  externa of both ears 01/24/2020   COPD (chronic obstructive pulmonary disease) (CMD)    Depression    Diabetes mellitus (CMD)    Diabetes mellitus type II, controlled (CMD)    Diabetic retinopathy    (CMD)    GERD (gastroesophageal reflux disease)    Gout    History of itching    Hypercholesterolemia    Hypermetropia of both eyes    Hypertension    Hypertension with goal to be determined    Keratoconjunctivitis sicca of both eyes not specified as Sjogren    Latex allergy    Long term (current) use of oral hypoglycemic drugs    Migraine    Mild nonproliferative diabetic retinopathy of both eyes without macular edema associated with type 2 diabetes mellitus    (CMD)    Myopia of both eyes with astigmatism and presbyopia    Nuclear sclerosis of both eyes    Posterior subcapsular polar senile cataract of both eyes    PVD (peripheral vascular disease)    Sleep apnea    Stomach acid    Stroke    (CMD)    Varicella    Vitreous floaters of both eyes   [2] Past Surgical History: Procedure Laterality Date   ANKLE ARTHROSCOPY     Procedure: ANKLE ARTHROSCOPY   COLONOSCOPY     Procedure: COLONOSCOPY   EXAMINATION UNDER ANESTHESIA N/A 03/09/2014   Procedure: EXAM UNDER ANESTHESIA (EUA);  Surgeon: Cordella Ottis Samples, MD;  Location: [facility] OUT[facility]IENT OR;  Service: General;  Laterality: N/A;     LATEX ALLERGY   LUMBAR EPIDURAL INJECTION N/A 03/02/2021   Procedure: LUMBAR INTERLAMINAR EPIDURAL INJECTION L4-5;  Surgeon: Janus Von Adora Blanch, MD;  Location: [facility];  Service: Pain Services;  Laterality: N/A;   OTHER SURGICAL HISTORY Left    Procedure: OTHER SURGICAL HISTORY (vascular stent); x 2    OTHER SURGICAL HISTORY Bilateral    Procedure: OTHER SURGICAL HISTORY (PI OU)   SKIN BIOPSY     Procedure: SKIN BIOPSY  [3] Allergies Allergen Reactions   Surgical Tape Itching, Other (See Comments) and Rash    TEGADERM- TEARS THE SKIN   TEGADERM- TEARS THE SKIN  tegaderm  TEGADERM- TEARS THE SKIN  tegaderm   Varenicline Mental Status Changes, Other (See Comments) and Angioedema    Extreme anger and depression   Metformin  GI Intolerance   Silver Other (See Comments)    Wife states it gets worse   Adhesive Rash    tegaderm, TEGADERM- TEARS THE SKIN   Bean Pod Extract Itching and Rash    PINTO BEANS   Diphenhydramine -Zinc Acetate Itching and Rash   Latex Itching, Hives and Rash  [4] Family History Problem Relation Name Age of Onset   Diabetes Mother     No Known Problems Father     Diabetes Sister     Diabetes Brother     Glaucoma Neg Hx     Macular degeneration Neg Hx    [5] Social History Tobacco Use   Smoking status: Every Day    Current packs/day: 0.50    Average packs/day: 1.2 packs/day for 58.0 years (68.0 ttl pk-yrs)    Types: Cigarettes    Start date: 08/05/1966    Passive exposure: Current   Smokeless tobacco: Never   Tobacco comments:    current 0.5pp; 2025-per 2021 LS form/chart(1.25); varied hx 1.5 ppd x 52 yrs then 1/2 ppd since 2020  Vaping Use   Vaping status: Never Used  Substance Use Topics   Alcohol use: Never   Drug use: No

## 2024-08-04 NOTE — Discharge Summary (Signed)
 [place] [facility]  Discharge Summary   Name: Alejandro Jones Age: 68 yrs  MRN: [id number] DOB: [birth year]  Admit Date: 08/02/2024 Admitting Physician: Derryl MARLA Course, MD  Discharge Date: 08/04/2024 Discharge Physician: Dorise Donnice Hammers, MD    Discharge Diagnoses:   Principal Problem:   Hypercapnic respiratory failure    (CMD) Active Problems:   OSA on CPAP   Type 2 diabetes mellitus with stage 2 chronic kidney disease, without long-term current use of insulin     (CMD)   Impaired functional mobility, balance, gait, and endurance   History of CVA (cerebrovascular accident)   Chronic systolic congestive heart failure    (CMD)   Back wound   Impaired functional mobility, balance, gait, and endurance Resolved Problems:   COPD with acute exacerbation    (CMD)   COPD exacerbation    (CMD)   Community acquired pneumonia of right lower lobe of lung   AKI (acute kidney injury)   Acute on chronic respiratory failure with hypoxia and hypercapnia    (CMD)   Upper airway cough syndrome   TO DO List at Follow-up for PCP/Specialist:   Follow up with PCP and Pulmonology  Start trelegy (wife reports poor adherence) Finish prednisone  taper and Augmentin    Pending Labs     Order Current Status   Blood Culture, 1st Set Preliminary result   Blood Culture, 2nd Set Preliminary result        Hospital Course:   For full details, please see H&P, progress notes, consult notes and ancillary notes. Briefly, Alejandro Jones is a 68 year old male with a history of COPD, chronic systolic congestive heart failure, type 2 diabetes mellitus, and prior pneumonia who was admitted with respiratory distress, generalized weakness, and visual disturbances following a recent outpatient diagnosis of pneumonia. On presentation, he was found to have worsening hypoxemia and hypercapnia, with a pH of 7.2 and pCO2 of 60.1, and physical exam notable for increased work of breathing, rhonchi  throughout the right lung, and decreased breath sounds on the left. Pulmonary consultation confirmed acute on chronic hypoxic, hypercapnic respiratory failure in the context of severe COPD and recent pneumonia, supported by prior PFTs (FEV1 0.98 L, 28% predicted; DLCO 25%), CT chest showing centrilobular emphysema.   He was initially managed with BiPAP and supplemental oxygen, though he intermittently refused noninvasive ventilation and nasal cannula, requiring escalation to 4L O2 when saturations dropped to 83%. He received IV antibiotics (azithromycin  and ceftriaxone ), systemic steroids, scheduled LAMA/LABA/ICS inhaled therapies, and pulmonary toilet for COPD exacerbation and community-acquired pneumonia. Pulmonary recommended continuation of BiPAP at bedtime and as needed, with oxygen titrated to maintain SpO2 88-95%. His respiratory status improved during admission, and he reported feeling ready for discharge.  Additional issues addressed included an acute kidney injury (creatinine peaked at 2.02, improved to 1.13 with gentle hydration and discontinuation of diuretics), an unstageable right ischial pressure injury managed with wound care nursing recommendations, and oropharyngeal dysphagia with silent aspiration on thin liquids confirmed by modified barium swallow, for which swallowing therapy and compensatory strategies (chin tuck, upright positioning) were initiated and recommended for continuation after discharge. He was evaluated by physical therapy for impaired functional mobility and found to require maximal assistance for transfers and nonambulatory status, with recommendations for home health PT, durable medical equipment, and 24-hour supervision at home.  Other chronic conditions managed during admission included diabetes mellitus (managed with sliding scale insulin  and diabetic diet), obstructive sleep apnea (continued BiPAP), and chronic systolic heart failure (diuretics held  during AKI,  Entresto continued). Gabapentin was held during the admission. Discharge planning included home with durable medical equipment, home health services, and follow-up with pulmonary and primary care.  The patient's chronic medical conditions were treated accordingly per the patient's home medication regimen except as noted in the plan above and in the medication list below.    Discharge Condition:   Disposition: Patient discharged to Home or Self Care in fair condition.  Diet at discharge:    Dietary Orders  (From admission, onward)               Ordered    Adult Diet- Consistent Carbohydrate; Med (45-60 gm/meal)  (Adult Pharm Hypoglycemia Orders OR Subcutaneous Insulin  (Non-Pregnant) PLUS Hypoglycemia Orders)  Diet effective now       References:    Medical Nutrition Management (MNM) for Registered Dietitian  Question Answer Comment  Diet type: Consistent Carbohydrate   Carbohydrate Restriction: Med (45-60 gm/meal)   Medical Nutrition Management By RD Yes, Medical Nutrition Management By RD      08/02/24 1812             Wound 02/25/24 Skin Tear Perineum Left;Inner (Active)  Date First Assessed/Time First Assessed: 02/25/24 1730   Pre-Existing Wound: Yes  Primary Wound Type: Skin Tear  Location: Perineum  Wound Location Orientation: Left;Inner     Physical Exam at Discharge   BP 113/74 (BP Location: Left arm, Patient Position: Lying)   Pulse 102   Temp 97.7 F (36.5 C) (Oral)   Resp 20   Ht 1.829 m (6')   Wt 81.6 kg (180 lb)   SpO2 95%   BMI 24.41 kg/m  GEN: NAD, lying in bed CV: RRR PULM: coarse breath sounds, mild wheezing, non-labored respirations, on [place]itten  ABD: soft, NT/ND EXT: No edema PSYCH: A+Ox4, appropriate   Discharge Medications:      Medication List     START taking these medications    predniSONE  10 mg tablet Commonly known as: DELTASONE  Take 3 tablets (30 mg total) by mouth daily for 2 days, THEN 2 tablets (20 mg total) daily for 2 days,  THEN 1 tablet (10 mg total) daily for 2 days. Start taking on: August 05, 2024   Trelegy Ellipta 100-62.5-25 mcg inhaler Generic drug: fluticasone-umeclidinium-vilanterol Inhale 1 puff in the morning.       CONTINUE taking these medications    acetaminophen  500 mg tablet Commonly known as: TYLENOL  Take 1,000 mg by mouth every 6 (six) hours as needed for mild pain (1-3) Indications: wife stated up to 3 times a day but usually no more than twice a day.   * albuterol  HFA 90 mcg/actuation inhaler Commonly known as: PROVENTIL  HFA;VENTOLIN  HFA;PROAIR  HFA Inhale 1 puff every 6 (six) hours as needed for wheezing.   * albuterol  2.5 mg /3 mL (0.083 %) nebulizer solution Take 3 mL (2.5 mg total) by nebulization every 6 (six) hours as needed for wheezing.   amoxicillin -pot clavulanate 875-125 mg per tablet Commonly known as: AUGMENTIN  Take 1 tablet by mouth 2 (two) times a day for 3 days.   ascorbic acid 500 mg tablet Commonly known as: VITAMIN C Take 1 tablet (500 mg total) by mouth 3 (three) times a week. Take with ferrous sulfate .   aspirin  81 mg EC tablet Take 81 mg by mouth at bedtime.   atorvastatin  40 mg tablet Commonly known as: LIPITOR Take 1 tablet (40 mg total) by mouth at bedtime.   buPROPion 300 mg 24 hr  tablet Commonly known as: WELLBUTRIN XL TAKE 1 TABLET EVERY DAY   colchicine 0.6 mg tablet Take 1 tablet by mouth once daily   cyanocobalamin  1,000 mcg tablet Commonly known as: VITAMIN B12 Take 1,000 mcg by mouth Once Daily for 30 days.   diclofenac sodium 1 % gel Commonly known as: VOLTAREN Apply 2 g topically 3 (three) times a day.   escitalopram  10 mg tablet Commonly known as: LEXAPRO  TAKE 1 TABLET EVERY DAY   ezetimibe 10 mg tablet Commonly known as: ZETIA TAKE 1 TABLET EVERY DAY   ferrous sulfate  325 mg (65 mg iron) tablet Commonly known as: Iron (ferrous sulfate ) Take 1 tablet (325 mg total) by mouth 3 (three) times a week.   finasteride 5 mg  tablet Commonly known as: PROSCAR Take 1 tablet (5 mg total) by mouth daily.   furosemide  20 mg tablet Commonly known as: LASIX  Take 1 tablet (20 mg total) by mouth daily.   gabapentin 300 mg capsule Commonly known as: NEURONTIN Take 600 mg by mouth nightly.   Janumet XR 50-1,000 mg Tm24 Generic drug: SITagliptin phos-metFORMIN  Take 1 tablet by mouth daily.   lidocaine  5 % patch Commonly known as: LIDODERM  APPLY UP TO 3 PATCHES EVERY DAY AS DIRECTED. (ALLOW PATCHES TO REMAIN ON THE AREA FOR UP TO 12 HOURS IN A 24 HOUR PERIOD)   magnesium  oxide 400 mg Tab tablet Take 1 tablet (400 mg total) by mouth 2 (two) times a day.   metoprolol succinate 25 mg 24 hr tablet Commonly known as: TOPROL XL Take 0.5 tablets (12.5 mg total) by mouth daily.   nebulizers Misc Please dispense 1 nebulizer with compressor and associated tubing   nitroglycerin  0.4 mg SL tablet Commonly known as: NITROSTAT  Place 1 tablet (0.4 mg total) under the tongue every 5 (five) minutes as needed for chest pain.   pantoprazole  40 mg EC tablet Commonly known as: PROTONIX  TAKE 1 TABLET TWICE DAILY   pentoxifylline 400 mg CR tablet Commonly known as: TRENtal TAKE 1 TABLET IN THE MORNING AND 1 TABLET AT NOON AND 1 TABLET IN THE EVENING. TAKE WITH MEALS.   sacubitriL-valsartan 24-26 mg per tablet Commonly known as: Entresto Take 0.5 tablets by mouth 2 (two) times a day.   sennosides-docusate sodium  8.6-50 mg per tablet Commonly known as: PERICOLACE Take 3 tablets by mouth Once Daily for 90 days.   solifenacin 10 mg tablet Commonly known as: VESICARE Take 1 tablet (10 mg total) by mouth daily. Swallow tablet whole; do not crush, chew, or split.   tamsulosin 0.4 mg Cap Commonly known as: FLOMAX Take 2 capsules (0.8 mg total) by mouth daily.   topiramate 50 mg tablet Commonly known as: TOPAMAX Take 1 tablet (50 mg total) by mouth 2 (two) times a day.   traMADoL  50 mg tablet Commonly known as:  ULTRAM  Take 1 tablet (50 mg total) by mouth every 6 (six) hours as needed for moderate pain (4-6) or severe pain (7-10).   trifluoperazine 2 mg tablet Commonly known as: STELAZINE Take 2 tablets (4 mg total) by mouth daily. Take 2 tablets daily at night   UNABLE TO FIND bipap machine      * * This list has 2 medication(s) that are the same as other medications prescribed for you. Read the directions carefully, and ask your doctor or other care provider to review them with you.          STOP taking these medications    doxycycline  100  mg tablet Commonly known as: VIBRA -TABS         Where to Get Your Medications     These medications were sent to [facility] [address] [place] - [address] - PHONE: [phone] - FAX: [phone]  [address] [place] [address]    Phone: [phone]  amoxicillin -pot clavulanate 875-125 mg per tablet predniSONE  10 mg tablet Trelegy Ellipta 100-62.5-25 mcg inhaler     Significant Diagnostic Tests:   LABS:  Lab Results  Component Value Date   WBC 12.16 (H) 08/04/2024   HGB 13.6 (L) 08/04/2024   HCT 41.9 08/04/2024   PLT 361 08/04/2024   CHOL 87 04/29/2023   TRIG 174 (H) 04/29/2023   HDL 27 (L) 04/29/2023   LDLDIRECT 47 03/19/2022   ALT 7 08/02/2024   AST 8 (L) 08/02/2024   NA 139 08/04/2024   K 4.1 08/04/2024   CL 104 08/04/2024   CREATININE 1.13 08/04/2024   BUN 21 08/04/2024   CO2 26 08/04/2024   TSH 1.861 01/21/2024   PSA 0.09 12/03/2023   HGBA1C 6.6 (H) 04/07/2024   IMAGING:  FL Esophagus Barium Swallow With Video And Speech  Final Result by Norleen Marsa Roulette, MD (12/30 1530)  FL ESOPHAGUS BARIUM SWALLOW WITH VIDEO AND SPEECH, 08/03/2024 3:09 PM     INDICATION:  History of pharyngeal and esophageal dysphagia with   aspiration on prior MBS. Recurrent PNA   COMPARISON: None.    TECHNIQUE: Modified barium swallow was performed in conjunction with the   Speech Pathology service. The  patient was observed fluoroscopically during   the oral administration of various consistencies of barium-impregnated   food and/or simulated medicinal materials.     FINDINGS:    Scout: Edentulous. Degenerative changes of the cervical spine.  Thin liquids: Aspiration seen within barium which improved with chin tuck   maneuver.  Nectar thick liquids: No penetration or aspiration.  Purees: No penetration or aspiration.  Soft solid: No penetration or aspiration.  AP sweep: Unremarkable gravity-assisted clearance.    IMPRESSION:  Modified barium swallow as described. Please refer to the Speech Pathology   report for further detail and dietary recommendations.    Sari Lamp, PA-C was present in the fluoroscopy room during the entirety   of the procedure and supervised and interpreted by Dr. Gordy Roulette       [facility] Chest 1 View  Final Result by Bennet Ozell Hurst, MD (12/29 1325)  XR CHEST 1 VIEW, 08/02/2024 1:16 PM    INDICATION:Shortness of Breath   COMPARISON: Chest radiograph 07/24/2024    FINDINGS:     Supportive devices: None  Cardiovascular/lungs/pleura: Similarly enlarged cardiac silhouette.   Pulmonary vasculature is mildly congested. Persistent low lung volumes.   Similar bilateral diffuse interstitial thickening. Similar small right   pleural effusion. No pneumothorax.  Other: No interval osseous change.    IMPRESSION:  Similar cardiomegaly with mild pulmonary interstitial edema and small   right pleural effusion.           CULTURES:  Results for orders placed or performed during the hospital encounter of 08/02/24 (from the past week)  Blood Culture, 1st Set   Specimen: Venous; Blood  Result Value Ref Range   Blood Culture No growth after 2 days.   Blood Culture, 2nd Set   Specimen: Venous; Blood  Result Value Ref Range   Blood Culture No growth after 2 days.      Consults:   IP CONSULT TO HOSPITALIST  IP CONSULT TO PULMONOLOGY WOUND OSTOMY EVAL AND  TREAT   Follow-up Appointments:     Future Appointments  Date Time Provider Department Center  08/11/2024  2:30 PM Deven Hildegard Music, NP [facility] MSK LIN [facility] [place]  08/17/2024  2:00 PM Slater Earnie Lathe, PA-C [facility] PN PRE [facility]  08/19/2024  3:00 PM Murray CHRISTELLA Amos, MD [facility] PC PRE [facility]  09/13/2024  1:20 PM Alfonso Boone, MD [facility] NEU TRI [facility] [address]  09/13/2024  2:10 PM Delon Hendricks Moores, MD [facility] OPH [facility] [address]  12/14/2024  2:30 PM [facility] 04 HC VASCULAR 4 [facility] VSUR [address]  12/14/2024  3:15 PM Franky JENEANE Chihuahua, MD [facility] VSUR [address] [address] [facility]  02/21/2025  1:00 PM WFPREM CT1 WFPI RAD CT [facility]  04/27/2025  1:30 PM Joseph Jorizzo, MD [facility] DRM CC WFB CC WS  05/05/2025  2:15 PM Erna Creighton, MD [facility] CAR HP [address]      Predictive Model Details        36.8% (High)  Factor Value   Calculated 08/04/2024 12:06 27% Number of hospitalizations in last year 5   Readmission Risk Score v2 Model 9% Number of ED visits in last 90 days 3    8% Number of appointments in last 90 days 13    7% Number of active outpatient medication orders 32    6% Latest RDW in last 72 hrs 14.5 %      Electronically signed by: Dorise Donnice Hammers, MD 08/04/2024 2:34 PM Time spent on discharge: 43 minutes.  *Some images could not be shown.

## 2024-08-05 ENCOUNTER — Emergency Department (HOSPITAL_COMMUNITY)
Admission: EM | Admit: 2024-08-05 | Discharge: 2024-08-05 | Disposition: A | Discharge status: Home / Self Care | Source: Home / Self Care | Attending: Emergency Medicine | Admitting: Emergency Medicine

## 2024-08-05 ENCOUNTER — Emergency Department (HOSPITAL_COMMUNITY)

## 2024-08-05 ENCOUNTER — Encounter (HOSPITAL_COMMUNITY): Payer: Self-pay | Admitting: *Deleted

## 2024-08-05 DIAGNOSIS — R7989 Other specified abnormal findings of blood chemistry: Secondary | ICD-10-CM | POA: Insufficient documentation

## 2024-08-05 DIAGNOSIS — Z79899 Other long term (current) drug therapy: Secondary | ICD-10-CM | POA: Insufficient documentation

## 2024-08-05 DIAGNOSIS — I509 Heart failure, unspecified: Secondary | ICD-10-CM | POA: Diagnosis not present

## 2024-08-05 DIAGNOSIS — Z7984 Long term (current) use of oral hypoglycemic drugs: Secondary | ICD-10-CM | POA: Diagnosis not present

## 2024-08-05 DIAGNOSIS — E119 Type 2 diabetes mellitus without complications: Secondary | ICD-10-CM | POA: Diagnosis not present

## 2024-08-05 DIAGNOSIS — Z5329 Procedure and treatment not carried out because of patient's decision for other reasons: Secondary | ICD-10-CM | POA: Insufficient documentation

## 2024-08-05 DIAGNOSIS — Z8673 Personal history of transient ischemic attack (TIA), and cerebral infarction without residual deficits: Secondary | ICD-10-CM | POA: Insufficient documentation

## 2024-08-05 DIAGNOSIS — Z91040 Latex allergy status: Secondary | ICD-10-CM | POA: Diagnosis not present

## 2024-08-05 DIAGNOSIS — J189 Pneumonia, unspecified organism: Secondary | ICD-10-CM | POA: Diagnosis not present

## 2024-08-05 DIAGNOSIS — Z7951 Long term (current) use of inhaled steroids: Secondary | ICD-10-CM | POA: Insufficient documentation

## 2024-08-05 DIAGNOSIS — D72829 Elevated white blood cell count, unspecified: Secondary | ICD-10-CM | POA: Insufficient documentation

## 2024-08-05 DIAGNOSIS — I110 Hypertensive heart disease with heart failure: Secondary | ICD-10-CM | POA: Diagnosis not present

## 2024-08-05 DIAGNOSIS — R059 Cough, unspecified: Secondary | ICD-10-CM | POA: Diagnosis present

## 2024-08-05 DIAGNOSIS — J449 Chronic obstructive pulmonary disease, unspecified: Secondary | ICD-10-CM | POA: Diagnosis not present

## 2024-08-05 DIAGNOSIS — Z7982 Long term (current) use of aspirin: Secondary | ICD-10-CM | POA: Insufficient documentation

## 2024-08-05 LAB — I-STAT CG4 LACTIC ACID, ED
Lactic Acid, Venous: 1.5 mmol/L (ref 0.5–1.9)
Lactic Acid, Venous: 0.8 mmol/L (ref 0.5–1.9)

## 2024-08-05 LAB — I-STAT VENOUS BLOOD GAS, ED
Acid-base deficit: 1 mmol/L (ref 0.0–2.0)
Bicarbonate: 23.7 mmol/L (ref 20.0–28.0)
Calcium, Ion: 1.18 mmol/L (ref 1.15–1.40)
HCT: 48 % (ref 39.0–52.0)
Hemoglobin: 16.3 g/dL (ref 13.0–17.0)
O2 Saturation: 84 %
Potassium: 4 mmol/L (ref 3.5–5.1)
Sodium: 139 mmol/L (ref 135–145)
TCO2: 25 mmol/L (ref 22–32)
pCO2, Ven: 40 mmHg — ABNORMAL LOW (ref 44–60)
pH, Ven: 7.38 (ref 7.25–7.43)
pO2, Ven: 49 mmHg — ABNORMAL HIGH (ref 32–45)

## 2024-08-05 LAB — BASIC METABOLIC PANEL WITH GFR
Anion gap: 13 (ref 5–15)
BUN: 27 mg/dL — ABNORMAL HIGH (ref 8–23)
CO2: 23 mmol/L (ref 22–32)
Calcium: 9.8 mg/dL (ref 8.9–10.3)
Chloride: 101 mmol/L (ref 98–111)
Creatinine, Ser: 1.52 mg/dL — ABNORMAL HIGH (ref 0.61–1.24)
GFR, Estimated: 49 mL/min — ABNORMAL LOW
Glucose, Bld: 160 mg/dL — ABNORMAL HIGH (ref 70–99)
Potassium: 5.1 mmol/L (ref 3.5–5.1)
Sodium: 137 mmol/L (ref 135–145)

## 2024-08-05 LAB — CBC
HCT: 49.5 % (ref 39.0–52.0)
Hemoglobin: 16.1 g/dL (ref 13.0–17.0)
MCH: 31 pg (ref 26.0–34.0)
MCHC: 32.5 g/dL (ref 30.0–36.0)
MCV: 95.2 fL (ref 80.0–100.0)
Platelets: 404 K/uL — ABNORMAL HIGH (ref 150–400)
RBC: 5.2 MIL/uL (ref 4.22–5.81)
RDW: 14.2 % (ref 11.5–15.5)
WBC: 16.2 K/uL — ABNORMAL HIGH (ref 4.0–10.5)
nRBC: 0 % (ref 0.0–0.2)

## 2024-08-05 LAB — TROPONIN T, HIGH SENSITIVITY
Troponin T High Sensitivity: 25 ng/L — ABNORMAL HIGH (ref 0–19)
Troponin T High Sensitivity: 27 ng/L — ABNORMAL HIGH (ref 0–19)

## 2024-08-05 MED ORDER — SODIUM CHLORIDE 0.9 % IV SOLN
500.0000 mg | Freq: Once | INTRAVENOUS | Status: AC
  Administered 2024-08-05: 500 mg via INTRAVENOUS
  Filled 2024-08-05: qty 5

## 2024-08-05 MED ORDER — HYDROCORTISONE 1 % EX CREA
TOPICAL_CREAM | Freq: Once | CUTANEOUS | Status: AC
  Filled 2024-08-05: qty 28

## 2024-08-05 MED ORDER — SODIUM CHLORIDE 0.9 % IV SOLN
1.0000 g | Freq: Once | INTRAVENOUS | Status: AC
  Administered 2024-08-05: 1 g via INTRAVENOUS
  Filled 2024-08-05: qty 10

## 2024-08-05 NOTE — ED Notes (Addendum)
 Attempted to start IV and draw labs; patient said I don't want any of that; I am ready to go home. Attempting to speak with wife.

## 2024-08-05 NOTE — ED Notes (Signed)
 PT stating he does not want blood drawn and he wants to go home.  Wife located in lobby.  She is stating that pt has not been himself since being dx with pneumonia 1 week ago.  She stated pt able to answer ao x 4 questions, but is not himself and is being irrational.

## 2024-08-05 NOTE — Discharge Instructions (Signed)
 Make sure that you start taking your antibiotics that are currently at the pharmacy starting tomorrow.  Return to the emergency room if you have any worsening symptoms or change your mind about being admitted.  Make an appointment to have close follow-up with your primary care doctor.

## 2024-08-05 NOTE — ED Triage Notes (Signed)
 Pt here via [facility] from home.  Recent dx of pneumonia.  Returned home yesterday from a hospital, but, did not take meds as prescribed and has been experiencing chest pain with coughing spells.  Bp 118/82 Hr 108 97% RA Rr 18

## 2024-08-05 NOTE — ED Provider Notes (Signed)
 " [place] EMERGENCY DEPARTMENT AT [facility] Provider Note   CSN: [id number] Arrival date & time: 08/05/24  1724     Patient presents with: Cough   Alejandro Jones is a 69 y.o. male.   Patient is a 79-year-old male with a history of diabetes, CVA, CHF, COPD who presents with nausea and chest pain.  His wife says that he was recently admitted for pneumonia.  He was admitted from December 29 to December 31 for pneumonia to [facility].  He had hypercarbic respiratory failure as well with the pCO2 of 60 and hypoxia.  He required a period of BiPAP and was treated with antibiotics.  He left the hospital yesterday because his wife said he did not want to stay anymore.  They wanted to send him to a rehab facility but he refused.  His wife said he was doing okay this morning but then started having some chest pain and was having nausea.  He denies any vomiting.  He says that he feels fine now and wants to go home.  He is not complaining of any shortness of breath.  His wife studies had some confusion but he gets that sometimes when he is sick.  He denies any vomiting or diarrhea.  No recent fevers.  He is having some itching to his left arm where he had an IV in the hospital.  He is not having any ongoing chest pain.  No shortness of breath.  He was treated with Rocephin  and Zithromax  in the hospital and discharged on Augmentin  and prednisone .       Prior to Admission medications  Medication Sig Start Date End Date Taking? Authorizing Provider  ezetimibe (ZETIA) 10 MG tablet Take 10 mg by mouth daily. 07/03/22  Yes [provider]  finasteride (PROSCAR) 5 MG tablet Take 5 mg by mouth daily. 06/09/24 06/09/25 Yes [provider]  metoprolol succinate (TOPROL-XL) 25 MG 24 hr tablet Take 12.5 mg by mouth daily. 05/30/22 04/29/25 Yes [provider]  nitroGLYCERIN  (NITROSTAT ) 0.4 MG SL tablet Place 0.4 mg under the tongue every 5 (five) minutes as needed for  chest pain. 03/19/22  Yes [provider]  tamsulosin (FLOMAX) 0.4 MG CAPS capsule Take 0.8 mg by mouth daily after supper. 06/09/24  Yes [provider]  acetaminophen  (TYLENOL ) 500 MG tablet Take 1,000 mg by mouth every 6 (six) hours as needed for moderate pain.    [provider]  Adhesive Tape (MEDIPORE SOFT CLOTH TAPE) TAPE Apply topically. 03/03/17   [provider]  albuterol  (PROVENTIL ) (2.5 MG/3ML) 0.083% nebulizer solution Take 2.5 mg by nebulization every 6 (six) hours as needed for wheezing or shortness of breath. Reported on 10/31/2015    [provider]  albuterol  (VENTOLIN  HFA) 108 (90 Base) MCG/ACT inhaler Inhale 2 puffs into the lungs every 6 (six) hours as needed for wheezing or shortness of breath.    [provider]  aspirin  EC 81 MG tablet Take 81 mg by mouth daily.      [provider]  atorvastatin  (LIPITOR) 20 MG tablet Take by mouth. 10/26/19   [provider]  atorvastatin  (LIPITOR) 40 MG tablet  10/08/19   [provider]  buPROPion (WELLBUTRIN XL) 300 MG 24 hr tablet TAKE 1 TABLET EVERY DAY 10/19/18   [provider]  Camphor-Menthol-Methyl Sal 11-12-28 % CREA Use as directed 12/13/15   [provider]  cetirizine (ZYRTEC) 10 MG tablet Take by mouth.  [provider]  clotrimazole-betamethasone (LOTRISONE) cream Apply twice a day for 2 weeks 10/07/19   [provider]  colchicine 0.6 MG tablet Take 1 tablet by mouth 3 times daily as needed for flare up. 07/28/19   [provider]  dextromethorphan (DELSYM) 30 MG/5ML liquid Take 60 mg by mouth daily as needed for cough.     [provider]  diclofenac sodium (VOLTAREN) 1 % GEL APPLY TWO GRAMS TO AFFECTED AREA THREE TIMES DAILY AS NEEDED 10/26/18   [provider]  Disposable Gloves (LAVENDER NITRILE GLOVES/MEDIUM) MISC 1 application by Misc.(Non-Drug; Combo Route) route 2 times daily. 03/03/17    [provider]  docusate sodium  (COLACE) 100 MG capsule Take 300 mg by mouth at bedtime.     [provider]  ENTRESTO 24-26 MG Take by mouth. 05/30/22   [provider]  escitalopram  (LEXAPRO ) 10 MG tablet Take 10 mg by mouth daily. 11/18/15   [provider]  ferrous sulfate  325 (65 FE) MG EC tablet Take 325 mg by mouth daily.  09/26/15 01/04/16  [provider]  Fluticasone Furoate-Vilanterol (BREO ELLIPTA) 100-25 MCG/INH AEPB Inhale 1 puff into the lungs daily as needed (for shortness of breath).    [provider]  gabapentin (NEURONTIN) 300 MG capsule Take by mouth. 11/10/19   [provider]  glipiZIDE  (GLUCOTROL  XL) 10 MG 24 hr tablet Take by mouth.    [provider]  glucose blood (TRUE METRIX BLOOD GLUCOSE TEST) test strip Use 1 strip daily to check sugars  DX: E11.42 10/22/16   [provider]  HYDROcodone -acetaminophen  (NORCO/VICODIN) 5-325 MG tablet Take by mouth.    [provider]  hydrocortisone  cream 1 % Apply topically.    [provider]  hydrOXYzine  (ATARAX /VISTARIL ) 10 MG tablet Take 1 tablet (10 mg total) by mouth 3 (three) times daily as needed. 03/23/17   Alona Knee, PA-C  hydrOXYzine  (VISTARIL ) 25 MG capsule Take by mouth.    [provider]  ibuprofen  (ADVIL ,MOTRIN ) 200 MG tablet Take 600 mg by mouth every 6 (six) hours as needed. For pain.    [provider]  JANUMET XR 50-1000 MG TB24 Take 1 tablet by mouth daily.    [provider]  JENTADUETO 2.12-998 MG TABS  10/11/19   [provider]  Lancets 30G MISC 1 Stick by Misc.(Non-Drug; Combo Route) route daily. Use true metrix lancets  DX: E11.42 10/22/16   [provider]  lidocaine  (LIDODERM ) 5 % APPLY UP TO 3 PATCHES EVERY DAY AS DIRECTED. ALLOW PATCHES)TO REMAIN ON THE AREA FOR UP TO 12 HOURS IN A 24 HOUR PERIOD 11/04/19   [provider]  magnesium  oxide (MAG-OX) 400 MG  tablet Take by mouth.    [provider]  MAGNESIUM  PO Take 1 tablet by mouth daily.    [provider]  Menthol, Topical Analgesic, 5 % PADS Apply 1 patch topically at bedtime as needed (for leg pain).     [provider]  Menthol-Methyl Salicylate (MUSCLE RUB EX) Use as directed 12/13/15   [provider]  Menthol-Methyl Salicylate (MUSCLE RUB) 10-15 % CREA as needed. 11/12/13   [provider]  methylPREDNISolone  (MEDROL  DOSEPAK) 4 MG TBPK tablet Take as directed. 09/26/15   [provider]  Misc. Devices (ROLLATOR ULTRA-LIGHT) MISC Rolling walker with seat 02/02/16   [provider]  omeprazole (PRILOSEC OTC) 20 MG tablet Take by mouth.    [provider]  pantoprazole  (PROTONIX )  40 MG tablet TAKE 1 TABLET EVERY 12 HOURS 05/13/18   [provider]  pentoxifylline (TRENTAL) 400 MG CR tablet Take by mouth. 10/20/19   [provider]  pioglitazone  (ACTOS ) 30 MG tablet Take by mouth. 10/29/13   [provider]  prednisoLONE acetate (PRED FORTE) 1 % ophthalmic suspension Place 1 drop into both eyes 3 (three) times a day.    [provider]  rosuvastatin (CRESTOR) 20 MG tablet  10/20/19   [provider]  Saxagliptin -Metformin  (KOMBIGLYZE XR) 2.12-998 MG TB24 Take by mouth. 10/18/19   [provider]  senna-docusate (SENOKOT-S) 8.6-50 MG tablet Take by mouth.    [provider]  sildenafil (REVATIO) 20 MG tablet Take by mouth. 09/06/19   [provider]  spironolactone (ALDACTONE) 25 MG tablet Take by mouth. 05/30/22   [provider]  thioridazine (MELLARIL) 10 MG tablet Take 3 tablets by mouth at bedtime 10/29/17   [provider]  topiramate (TOPAMAX) 100 MG tablet Take 100 mg by mouth 2 (two) times daily.    [provider]  traMADol  (ULTRAM ) 50 MG tablet Take 50 mg by mouth every 6 (six) hours as needed. For pain    [provider]  triamcinolone  cream (KENALOG ) 0.1 % Apply 1 application topically daily as needed.  11/17/15   [provider]  trifluoperazine (STELAZINE) 1 MG tablet Two (2) times a day. 2 tablets at dinner, 1 at bedtime. 04/30/16   [provider]  trifluoperazine (STELAZINE) 2 MG tablet Take 2 mg by mouth 2 (two) times daily.    [provider]  UNABLE TO FIND Inhale into the lungs.    [provider]  vitamin B-12 (CYANOCOBALAMIN ) 1000 MCG tablet Take 1,000 mcg by mouth daily.    [provider]    Allergies: Latex, Other, Tape, Varenicline, Metformin , Metformin  and related, Silver, Bean pod extract, Diphenhydramine -zinc acetate, and Wheat    Review of Systems  Constitutional:  Positive for fatigue. Negative for chills, diaphoresis and fever.  HENT:  Negative for congestion, rhinorrhea and sneezing.   Eyes: Negative.   Respiratory:  Positive for cough. Negative for chest tightness and shortness of breath.   Cardiovascular:  Positive for chest pain. Negative for leg swelling.  Gastrointestinal:  Positive for nausea. Negative for abdominal pain, blood in stool, diarrhea and vomiting.  Genitourinary:  Negative for difficulty urinating, flank pain, frequency and hematuria.  Musculoskeletal:  Negative for arthralgias and back pain.  Skin:  Negative for rash.  Neurological:  Negative for dizziness, speech difficulty, weakness, numbness and headaches.    Updated Vital Signs BP 102/77   Pulse (!) 115   Temp 97.8 F (36.6 C) (Oral)   Resp 20   Ht 6' (1.829 m)   Wt 91.7 kg   SpO2 98%   BMI 27.42 kg/m   Physical Exam Constitutional:      Appearance: He is well-developed.  HENT:     Head: Normocephalic and atraumatic.  Eyes:     Pupils: Pupils are equal, round, and reactive to light.  Cardiovascular:     Rate and Rhythm: Regular rhythm. Tachycardia present.     Heart sounds: Normal heart sounds.  Pulmonary:     Effort: Pulmonary effort is normal.  No respiratory distress.     Breath sounds: Wheezing (Few expiratory wheezes bilaterally) present. No rales.  Chest:     Chest wall: No tenderness.  Abdominal:     General: Bowel sounds are normal.  Palpations: Abdomen is soft.     Tenderness: There is no abdominal tenderness. There is no guarding or rebound.  Musculoskeletal:        General: Normal range of motion.     Cervical back: Normal range of motion and neck supple.     Comments: No edema or calf tenderness, small area of erythema to his right AC area of his elbow where he has been itching where his prior IV site was.  There is no drainage or suggestions of infection.  Lymphadenopathy:     Cervical: No cervical adenopathy.  Skin:    General: Skin is warm and dry.     Findings: No rash.  Neurological:     General: No focal deficit present.     Mental Status: He is alert and oriented to person, place, and time.     (all labs ordered are listed, but only abnormal results are displayed) Labs Reviewed  CBC - Abnormal; Notable for the following components:      Result Value   WBC 16.2 (*)    Platelets 404 (*)    All other components within normal limits  BASIC METABOLIC PANEL WITH GFR - Abnormal; Notable for the following components:   Glucose, Bld 160 (*)    BUN 27 (*)    Creatinine, Ser 1.52 (*)    GFR, Estimated 49 (*)    All other components within normal limits  I-STAT VENOUS BLOOD GAS, ED - Abnormal; Notable for the following components:   pCO2, Ven 40.0 (*)    pO2, Ven 49 (*)    All other components within normal limits  TROPONIN T, HIGH SENSITIVITY - Abnormal; Notable for the following components:   Troponin T High Sensitivity 25 (*)    All other components within normal limits  TROPONIN T, HIGH SENSITIVITY - Abnormal; Notable for the following components:   Troponin T High Sensitivity 27 (*)    All other components within normal limits  I-STAT CG4 LACTIC ACID, ED  I-STAT CG4 LACTIC ACID, ED    EKG: EKG  Interpretation Date/Time:  Thursday August 05 2024 17:42:45 EST Ventricular Rate:  116 PR Interval:  132 QRS Duration:  86 QT Interval:  326 QTC Calculation: 453 R Axis:   38  Text Interpretation: Sinus tachycardia ST & T wave abnormality, consider lateral ischemia Abnormal ECG When compared with ECG of 04-Jan-2016 17:21, PREVIOUS ECG IS PRESENT since last tracing no significant change Confirmed by Lenor Hollering [phone]) on 08/05/2024 10:32:42 PM  Radiology: [facility] Chest 2 View Result Date: 08/05/2024 EXAM: 2 VIEW(S) XRAY OF THE CHEST 08/05/2024 07:15:00 PM COMPARISON: 02/23/2024, CT chest 06/31/2017. CLINICAL HISTORY: chest pain with coughing. Rx dx of pnx. FINDINGS: LIMITATIONS/ARTIFACTS: Patient is rotated. LUNGS AND PLEURA: Heterogeneous opacities in right lower lung zone. Associated development of a nodular-like 1.6 cm peripheral airspace opacity. Subcentimeter calcified granuloma in lateral right midlung zone. Blunting of right lateral costophrenic angle, possibly representing small layering right pleural effusion versus superimposed lung parenchymal opacity. No pneumothorax. HEART AND MEDIASTINUM: No acute abnormality of the cardiac and mediastinal silhouettes. BONES AND SOFT TISSUES: No acute osseous abnormality. IMPRESSION: 1. Heterogeneous opacities in the right lower lung zone with a 1.6 cm peripheral nodular-appearing airspace opacity, favored infectious or inflammatory.Follow-up PA and lateral chest X-ray is recommended in 3-4 weeks following therapy to ensure resolution and exclude underlying malignancy. 2. Possible small layering right pleural effusion. Electronically signed by: Morgane Naveau MD 08/05/2024 07:23 PM EST RP Workstation: [id number]  Procedures   Medications Ordered in the ED  hydrocortisone  cream 1 % ( Topical Given 08/05/24 2127)  cefTRIAXone  (ROCEPHIN ) 1 g in sodium chloride  0.9 % 100 mL IVPB (0 g Intravenous Stopped 08/05/24 2043)  azithromycin  (ZITHROMAX ) 500 mg in  sodium chloride  0.9 % 250 mL IVPB (0 mg Intravenous Stopped 08/05/24 2116)                                    Medical Decision Making Amount and/or Complexity of Data Reviewed Labs: ordered. Radiology: ordered.   This patient presents to the ED for concern of chest pain, nausea, this involves an extensive number of treatment options, and is a complaint that carries with it a high risk of complications and morbidity.  I considered the following differential and admission for this acute, potentially life threatening condition.  The differential diagnosis includes worsening pneumonia, bowel obstruction, ACS, anemia, electrolyte abnormality, uremia, UTI  MDM:    Patient is a 69 year old who was just recently admitted to [facility] for pneumonia and respiratory failure.  He reportedly left prior to then wanting to discharge him per his wife.  He comes back today because he was having some nausea and some chest pain earlier.  His chest x-ray shows some concerning findings for pneumonia.  This looks different than the reading that was noted at [facility]  His white count is higher than it was at [facility].  His pCO2 today looks okay it looks similar to his discharge pCO2.  His lactic acid is normal.  His creatinine is mildly elevated but similar to prior recent values.  I recommended patient be admitted to the hospital.  He has ongoing tachycardia with a heart rate in the low 100s.  I do note that on discharge his heart rate was 102 from [facility].  He seems to have some worsening pneumonia as well as an elevated WBC count.  He also is noted to be tachycardic.  However he is adamant about not wanting to be admitted.  Discussed this with the patient and his wife and he is absolutely refusing admission.  He wants to leave AMA.  He is alert and oriented x 3.  Is leaving AMA.  I did give him a dose of IV antibiotics including Rocephin  and Zithromax  here.  He has a  prescription for Augmentin  that he needs to pick up from the pharmacy.  (Labs, imaging, consults)  Labs: I Ordered, and personally interpreted labs.  The pertinent results include: Elevated WBC count, normal lactic acid, mildly elevated troponin  Imaging Studies ordered: I ordered imaging studies including chest x-ray I independently visualized and interpreted imaging. I agree with the radiologist interpretation  Additional history obtained from wife at bedside.  External records from outside source obtained and reviewed including prior notes  Cardiac Monitoring: The patient was maintained on a cardiac monitor.  If on the cardiac monitor, I personally viewed and interpreted the cardiac monitored which showed an underlying rhythm of: Sinus tachycardia  Reevaluation: After the interventions noted above, I reevaluated the patient and found that they have :stayed the same  Social Determinants of Health:    Disposition:  left AMA  Co morbidities that complicate the patient evaluation  Past Medical History:  Diagnosis Date   Acid reflux    Asthma    Chronic neck pain    Constipation    COPD (  chronic obstructive pulmonary disease) (HCC)    Diabetes mellitus    Hypertension    Peripheral vascular disease      Medicines Meds ordered this encounter  Medications   hydrocortisone  cream 1 %   cefTRIAXone  (ROCEPHIN ) 1 g in sodium chloride  0.9 % 100 mL IVPB    Antibiotic Indication::   CAP   azithromycin  (ZITHROMAX ) 500 mg in sodium chloride  0.9 % 250 mL IVPB    I have reviewed the patients home medicines and have made adjustments as needed  Problem List / ED Course: Problem List Items Addressed This Visit   None Visit Diagnoses       Community acquired pneumonia, unspecified laterality    -  Primary   Relevant Medications   cefTRIAXone  (ROCEPHIN ) 1 g in sodium chloride  0.9 % 100 mL IVPB (Completed)   azithromycin  (ZITHROMAX ) 500 mg in sodium chloride  0.9 % 250 mL IVPB  (Completed)                Final diagnoses:  Community acquired pneumonia, unspecified laterality    ED Discharge Orders     None          Lenor Hollering, MD 08/05/24 2345  "
